# Patient Record
Sex: Female | Born: 1985 | Race: White | Hispanic: No | Marital: Married | Smoking: Never smoker
Health system: Southern US, Community
[De-identification: ages and names within clinical notes are randomized; demographics above are authoritative.]

## PROBLEM LIST (undated history)

## (undated) DIAGNOSIS — E049 Nontoxic goiter, unspecified: Secondary | ICD-10-CM

## (undated) DIAGNOSIS — F909 Attention-deficit hyperactivity disorder, unspecified type: Secondary | ICD-10-CM

## (undated) HISTORY — DX: Nontoxic goiter, unspecified: E04.9

## (undated) HISTORY — PX: TUBAL LIGATION: SHX77

---
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
## 2006-06-21 HISTORY — PX: BREAST BIOPSY: SHX20

## 2013-11-07 ENCOUNTER — Other Ambulatory Visit: Payer: Self-pay | Admitting: Physician Assistant

## 2013-11-07 ENCOUNTER — Encounter: Payer: Self-pay | Admitting: Physician Assistant

## 2013-11-07 ENCOUNTER — Ambulatory Visit (INDEPENDENT_AMBULATORY_CARE_PROVIDER_SITE_OTHER): Payer: PRIVATE HEALTH INSURANCE | Admitting: Physician Assistant

## 2013-11-07 VITALS — BP 130/79 | HR 84 | Ht 65.0 in | Wt 197.0 lb

## 2013-11-07 DIAGNOSIS — Z131 Encounter for screening for diabetes mellitus: Secondary | ICD-10-CM

## 2013-11-07 DIAGNOSIS — Z13220 Encounter for screening for lipoid disorders: Secondary | ICD-10-CM

## 2013-11-07 DIAGNOSIS — E042 Nontoxic multinodular goiter: Secondary | ICD-10-CM | POA: Insufficient documentation

## 2013-11-07 DIAGNOSIS — Z0000 Encounter for general adult medical examination without abnormal findings: Secondary | ICD-10-CM

## 2013-11-07 LAB — LIPID PANEL
Cholesterol: 207 mg/dL — ABNORMAL HIGH (ref 0–200)
HDL: 71 mg/dL (ref >39)
LDL CALC: 119 mg/dL — AB (ref 0–99)
Total CHOL/HDL Ratio: 2.9 Ratio
Triglycerides: 85 mg/dL (ref <150)
VLDL: 17 mg/dL (ref 0–40)

## 2013-11-07 LAB — COMPLETE METABOLIC PANEL WITH GFR
ALK PHOS: 119 U/L — AB (ref 39–117)
ALT: 25 U/L (ref 0–35)
AST: 22 U/L (ref 0–37)
Albumin: 4.5 g/dL (ref 3.5–5.2)
BILIRUBIN TOTAL: 0.6 mg/dL (ref 0.2–1.2)
BUN: 11 mg/dL (ref 6–23)
CO2: 23 meq/L (ref 19–32)
Calcium: 10 mg/dL (ref 8.4–10.5)
Chloride: 105 mEq/L (ref 96–112)
Creat: 0.74 mg/dL (ref 0.50–1.10)
GFR, Est Non African American: >89 mL/min
Glucose, Bld: 80 mg/dL (ref 70–99)
Potassium: 4.3 mEq/L (ref 3.5–5.3)
SODIUM: 139 meq/L (ref 135–145)
TOTAL PROTEIN: 7.6 g/dL (ref 6.0–8.3)

## 2013-11-07 NOTE — Patient Instructions (Signed)

## 2013-11-08 LAB — T4, FREE: FREE T4: 0.69 ng/dL — AB (ref 0.80–1.80)

## 2013-11-08 LAB — TSH: TSH: 12.067 u[IU]/mL — ABNORMAL HIGH (ref 0.350–4.500)

## 2013-11-11 ENCOUNTER — Other Ambulatory Visit: Payer: Self-pay | Admitting: Physician Assistant

## 2013-11-11 ENCOUNTER — Telehealth: Payer: Self-pay | Admitting: Physician Assistant

## 2013-11-11 ENCOUNTER — Encounter: Payer: Self-pay | Admitting: Physician Assistant

## 2013-11-11 DIAGNOSIS — E039 Hypothyroidism, unspecified: Secondary | ICD-10-CM

## 2013-11-11 DIAGNOSIS — E042 Nontoxic multinodular goiter: Secondary | ICD-10-CM

## 2013-11-11 MED ORDER — LEVOTHYROXINE SODIUM 75 MCG PO TABS: 75.0000 ug | ORAL_TABLET | Freq: Every day | ORAL | Status: DC

## 2013-11-11 NOTE — Progress Notes (Signed)
  Subjective:     Jocelyn Taylor is a 28 y.o. female and is here for a comprehensive physical exam. The patient reports no problems.  History   Social History  . Marital Status: Married    Spouse Name: N/A    Number of Children: N/A  . Years of Education: N/A   Occupational History  . Not on file.   Social History Main Topics  . Smoking status: Never Smoker   . Smokeless tobacco: Not on file  . Alcohol Use: Yes  . Drug Use: No  . Sexual Activity: Yes   Other Topics Concern  . Not on file   Social History Narrative  . No narrative on file   Health Maintenance  Topic Date Due  . Influenza Vaccine  09/21/2014  . Pap Smear  06/23/2016  . Tetanus/tdap  08/21/2022    The following portions of the patient's history were reviewed and updated as appropriate: allergies, current medications, past family history, past medical history, past social history, past surgical history and problem list.  Review of Systems A comprehensive review of systems was negative.   Objective:    BP 130/79  Pulse 84  Ht  (1.651 m)  Wt 197 lb (89.359 kg)  BMI 32.78 kg/m2 General appearance: alert, cooperative and appears stated age Head: Normocephalic, without obvious abnormality, atraumatic Eyes: conjunctivae/corneas clear. PERRL, EOM's intact. Fundi benign. Ears: normal TM's and external ear canals both ears Nose: Nares normal. Septum midline. Mucosa normal. No drainage or sinus tenderness. Throat: lips, mucosa, and tongue normal; teeth and gums normal Neck: no adenopathy, no carotid bruit, no JVD, supple, symmetrical, trachea midline and thyroid not enlarged, symmetric, no tenderness/mass/nodules Back: symmetric, no curvature. ROM normal. No CVA tenderness. Lungs: clear to auscultation bilaterally Heart: regular rate and rhythm, S1, S2 normal, no murmur, click, rub or gallop Abdomen: soft, non-tender; bowel sounds normal; no masses,  no organomegaly Extremities: extremities normal,  atraumatic, no cyanosis or edema Pulses: 2+ and symmetric Skin: Skin color, texture, turgor normal. No rashes or lesions Lymph nodes: Cervical, supraclavicular, and axillary nodes normal. Neurologic: Grossly normal    Assessment:    Healthy female exam.      Plan:    CPE- Tdap up to date. Flu shot up to date. Will get fasting labs. Discussed weight goals. Encouraged exercise at least 150 minutes weekly. Recommended calcium  and vitamin D 800 units.   Multiple thyroid nodules- will check TSH levels. Denies any new problems or concerns. No problems swallowing. Does not feel like increasing in size.     See After Visit Summary for Counseling Recommendations

## 2013-11-11 NOTE — Telephone Encounter (Signed)
Jocelyn Taylor, since TSH was elevated. I would like to add anti TPO as well as get follow up ultrasound or thyroid just to make sure nothing appears to be changing with nodules. Is pt ok with u/s? Does she want to go downstairs or where she has gone in past?

## 2013-11-12 ENCOUNTER — Encounter: Payer: Self-pay | Admitting: Physician Assistant

## 2013-11-12 DIAGNOSIS — E063 Autoimmune thyroiditis: Secondary | ICD-10-CM | POA: Insufficient documentation

## 2013-11-12 LAB — THYROID PEROXIDASE ANTIBODY

## 2013-11-13 NOTE — Telephone Encounter (Signed)
Pt wants to go to [facility].  Order faxed and placed in your basket to sign.

## 2013-11-21 ENCOUNTER — Ambulatory Visit (INDEPENDENT_AMBULATORY_CARE_PROVIDER_SITE_OTHER): Payer: PRIVATE HEALTH INSURANCE | Admitting: Physician Assistant

## 2013-11-21 ENCOUNTER — Encounter: Payer: Self-pay | Admitting: Physician Assistant

## 2013-11-21 VITALS — BP 138/91 | HR 91 | Ht 65.0 in | Wt 199.0 lb

## 2013-11-21 DIAGNOSIS — R41840 Attention and concentration deficit: Secondary | ICD-10-CM | POA: Insufficient documentation

## 2013-11-21 DIAGNOSIS — F419 Anxiety disorder, unspecified: Secondary | ICD-10-CM

## 2013-11-21 MED ORDER — BUPROPION HCL ER (XL) 150 MG PO TB24: 150.0000 mg | ORAL_TABLET | ORAL | Status: DC

## 2013-11-21 NOTE — Progress Notes (Signed)
   Subjective:    Patient ID: Jocelyn Taylor, female    DOB: 1986/02/09, 28 y.o.   MRN: [id number]  HPI Pt presents to the clinic to discuss focus issues. Every since she can remember she has had a hard time focuses but always made it by. In college she really noticed it was harder for her than all the rest of the kids but working hard. She never wanted to start taking medication due to stigma and the fact that her mother is on a lot of medication for mental diseases. She is now overwhelmed. She finds it hard to prioritize. She is constantly starting different projects but having a hard time comtenplating. It has started to affect work. She has not finished task due to forgetting. She is not getting very anxious    Review of Systems  All other systems reviewed and are negative.      Objective:   Physical Exam  Constitutional: She is oriented to person, place, and time. She appears well-developed and well-nourished.  HENT:  Head: Normocephalic and atraumatic.  Cardiovascular: Normal rate, regular rhythm and normal heart sounds.   Pulmonary/Chest: Effort normal and breath sounds normal.  Neurological: She is alert and oriented to person, place, and time.  Skin: Skin is dry.  Psychiatric: She has a normal mood and affect. Her behavior is normal.          Assessment & Plan:  Inattention/anxiety- certainly sounds like she has both focus issues ans anxiety. Would like her formally evaluated. Discussed stimulants and anxiety medications. Started wellbutrin 150mg  XL daily. May take some time to see any results. ADD/ADHD questionares were both positive with moderate focus issues. Follow up after formal testing or 4-6 weeks.   Pt lactating. wellbutrin has minimal milk transmission to the baby but does have some. Monitor baby for any mood changes once starting medication.

## 2013-11-21 NOTE — Patient Instructions (Signed)
Will get ADHD testing.

## 2013-12-12 ENCOUNTER — Ambulatory Visit: Payer: PRIVATE HEALTH INSURANCE | Admitting: Physician Assistant

## 2014-01-02 ENCOUNTER — Encounter: Payer: Self-pay | Admitting: Physician Assistant

## 2014-01-02 ENCOUNTER — Ambulatory Visit (INDEPENDENT_AMBULATORY_CARE_PROVIDER_SITE_OTHER): Payer: PRIVATE HEALTH INSURANCE | Admitting: Physician Assistant

## 2014-01-02 VITALS — BP 122/72 | HR 80 | Ht 65.0 in | Wt 196.0 lb

## 2014-01-02 DIAGNOSIS — E063 Autoimmune thyroiditis: Secondary | ICD-10-CM

## 2014-01-02 DIAGNOSIS — R41840 Attention and concentration deficit: Secondary | ICD-10-CM

## 2014-01-02 DIAGNOSIS — E042 Nontoxic multinodular goiter: Secondary | ICD-10-CM

## 2014-01-02 LAB — T4, FREE: FREE T4: 1.19 ng/dL (ref 0.80–1.80)

## 2014-01-02 LAB — TSH: TSH: 4.136 u[IU]/mL (ref 0.350–4.500)

## 2014-01-02 MED ORDER — METHYLPHENIDATE HCL ER 18 MG PO TB24: 18.0000 mg | ORAL_TABLET | Freq: Every day | ORAL | Status: DC

## 2014-01-02 NOTE — Progress Notes (Signed)
   Subjective:    Patient ID: Jocelyn Taylor, female    DOB: 05/03/1985, 28 y.o.   MRN: [id number]  HPI Patient is a 28 year old female who presents to the clinic to follow-up on new diagnosis of Hashimoto's thyroiditis. She recently started Synthroid 75 MCG and doing well. She has not noticed any overt symptomatic changes. She does deny any side effects. She is 5 months postpartum.   Review of Systems  All other systems reviewed and are negative.      Objective:   Physical Exam  Constitutional: She is oriented to person, place, and time. She appears well-developed and well-nourished.  HENT:  Head: Normocephalic and atraumatic.  Neck: Normal range of motion. Neck supple. No thyromegaly present.  Cardiovascular: Normal rate, regular rhythm and normal heart sounds.   Pulmonary/Chest: Effort normal and breath sounds normal.  Neurological: She is alert and oriented to person, place, and time.  Skin: Skin is dry.  Psychiatric: She has a normal mood and affect. Her behavior is normal.          Assessment & Plan:  Hashimoto's thyroiditis/multiple thyroid nodules-patient has been on Synthroid for 6 weeks. We'll recheck TSH and free T4. Discussed with patient that we'll continue to monitor her levels because her hormone could start to even out and she may come out of this thyroiditis date after 4 months of being postpartum. She does have a history of thyroid nodules. I would like for patient to get repeat ultrasound of the thyroid to make sure there is no growth or changes. Patient requests to make this appointment on her own since she works for the [facility] [facility].  Inattention- patient was never called with hormonal evaluation for ADHD. Will start trial of Concerta 18 MCG follow-up in one month. Discussed side effects of stimulant. Patient aware.

## 2014-01-07 ENCOUNTER — Other Ambulatory Visit: Payer: Self-pay | Admitting: *Deleted

## 2014-01-07 MED ORDER — LEVOTHYROXINE SODIUM 75 MCG PO TABS: 75.0000 ug | ORAL_TABLET | Freq: Every day | ORAL | Status: DC

## 2014-01-29 ENCOUNTER — Telehealth: Payer: Self-pay | Admitting: *Deleted

## 2014-01-29 DIAGNOSIS — E042 Nontoxic multinodular goiter: Secondary | ICD-10-CM

## 2014-01-29 NOTE — Telephone Encounter (Signed)
Reordered thyroid ultrasound because pt wants to go to [facility] in [place] instead of downstairs.

## 2014-01-30 ENCOUNTER — Encounter: Payer: Self-pay | Admitting: Physician Assistant

## 2014-01-30 ENCOUNTER — Ambulatory Visit (INDEPENDENT_AMBULATORY_CARE_PROVIDER_SITE_OTHER): Payer: PRIVATE HEALTH INSURANCE | Admitting: Physician Assistant

## 2014-01-30 VITALS — BP 119/70 | HR 92 | Ht 65.0 in | Wt 194.0 lb

## 2014-01-30 DIAGNOSIS — R41840 Attention and concentration deficit: Secondary | ICD-10-CM

## 2014-01-30 DIAGNOSIS — F419 Anxiety disorder, unspecified: Secondary | ICD-10-CM

## 2014-01-30 MED ORDER — METHYLPHENIDATE HCL ER 36 MG PO TB24: 36.0000 mg | ORAL_TABLET | Freq: Every day | ORAL | Status: DC

## 2014-01-30 MED ORDER — LEVOTHYROXINE SODIUM 75 MCG PO TABS: 75.0000 ug | ORAL_TABLET | Freq: Every day | ORAL | Status: DC

## 2014-01-30 NOTE — Progress Notes (Signed)
   Subjective:    Patient ID: Jocelyn Taylor, female    DOB: [birth year], 28 y.o.   MRN: [id number]  HPI Presented to the clinic to follow up on one month of concerta. Denies any side effects. Sleeping good and no palpitations. She feels some improvement in her focus but still feels like it takes a while for it to kick in. She has noticed not quite as "mind racing".    Review of Systems  All other systems reviewed and are negative.      Objective:   Physical Exam  Constitutional: She is oriented to person, place, and time. She appears well-developed and well-nourished.  HENT:  Head: Normocephalic and atraumatic.  Cardiovascular: Normal rate, regular rhythm and normal heart sounds.   Pulmonary/Chest: Effort normal and breath sounds normal.  Neurological: She is alert and oriented to person, place, and time.  Skin: Skin is dry.  Psychiatric: She has a normal mood and affect. Her behavior is normal.          Assessment & Plan:  Inattention/ADD- increased concerta. rediscussed side effects. Follow up in 1 month.

## 2014-03-08 ENCOUNTER — Encounter: Payer: Self-pay | Admitting: Physician Assistant

## 2014-03-10 ENCOUNTER — Other Ambulatory Visit: Payer: Self-pay | Admitting: Physician Assistant

## 2014-03-10 MED ORDER — METHYLPHENIDATE HCL ER (OSM) 27 MG PO TBCR: 27.0000 mg | EXTENDED_RELEASE_TABLET | ORAL | Status: DC

## 2014-03-10 NOTE — Progress Notes (Signed)
 36mg  making her feel a little jittery. Will try 27mg .

## 2014-05-04 ENCOUNTER — Other Ambulatory Visit: Payer: Self-pay | Admitting: *Deleted

## 2014-05-04 ENCOUNTER — Encounter: Payer: Self-pay | Admitting: Physician Assistant

## 2014-05-04 ENCOUNTER — Telehealth: Payer: Self-pay | Admitting: *Deleted

## 2014-05-04 MED ORDER — METHYLPHENIDATE HCL ER (OSM) 27 MG PO TBCR: 27.0000 mg | EXTENDED_RELEASE_TABLET | ORAL | Status: DC

## 2014-05-04 NOTE — Telephone Encounter (Signed)
Called and left a voicemail with patient to see if she has a direct phone number to Dr. Asher MuirHorns office. I need to contact them to request notes for her recent visit regarding ADHD.  Minna AntisEbony Brigham, [place]

## 2014-05-05 ENCOUNTER — Telehealth: Payer: Self-pay | Admitting: *Deleted

## 2014-05-05 DIAGNOSIS — E039 Hypothyroidism, unspecified: Secondary | ICD-10-CM

## 2014-05-05 NOTE — Telephone Encounter (Signed)
TSH ordered & faxed to lab.

## 2014-05-09 LAB — TSH: TSH: 1.143 u[IU]/mL (ref 0.350–4.500)

## 2014-05-11 ENCOUNTER — Other Ambulatory Visit: Payer: Self-pay | Admitting: *Deleted

## 2014-05-11 MED ORDER — LEVOTHYROXINE SODIUM 75 MCG PO TABS: 75.0000 ug | ORAL_TABLET | Freq: Every day | ORAL | Status: DC

## 2014-06-08 ENCOUNTER — Encounter: Payer: Self-pay | Admitting: Physician Assistant

## 2014-06-09 ENCOUNTER — Other Ambulatory Visit: Payer: Self-pay | Admitting: Physician Assistant

## 2014-06-09 MED ORDER — METHYLPHENIDATE HCL ER (OSM) 27 MG PO TBCR: 27.0000 mg | EXTENDED_RELEASE_TABLET | ORAL | Status: DC

## 2014-06-15 ENCOUNTER — Ambulatory Visit: Payer: PRIVATE HEALTH INSURANCE | Admitting: Physician Assistant

## 2014-06-23 ENCOUNTER — Ambulatory Visit (INDEPENDENT_AMBULATORY_CARE_PROVIDER_SITE_OTHER): Payer: 59 | Admitting: Physician Assistant

## 2014-06-23 ENCOUNTER — Encounter: Payer: Self-pay | Admitting: Physician Assistant

## 2014-06-23 VITALS — BP 128/86 | HR 84 | Wt 181.0 lb

## 2014-06-23 DIAGNOSIS — F900 Attention-deficit hyperactivity disorder, predominantly inattentive type: Secondary | ICD-10-CM | POA: Diagnosis not present

## 2014-06-23 DIAGNOSIS — F909 Attention-deficit hyperactivity disorder, unspecified type: Secondary | ICD-10-CM | POA: Insufficient documentation

## 2014-06-23 MED ORDER — METHYLPHENIDATE HCL ER (OSM) 27 MG PO TBCR: 27.0000 mg | EXTENDED_RELEASE_TABLET | ORAL | Status: DC

## 2014-06-23 NOTE — Progress Notes (Signed)
   Subjective:    Patient ID: Jocelyn Taylor, female    DOB: 17-Jun-1985, 29 y.o.   MRN: [id number]  HPI Pt is a 29 yo female who presents to the clinic for medication refills on ADHD medication. She is doing great. No side effects or concerns. No anorexia, palptiations, CP. She is much more productive at work.    Review of Systems  All other systems reviewed and are negative.      Objective:   Physical Exam  Constitutional: She is oriented to person, place, and time. She appears well-developed and well-nourished.  HENT:  Head: Normocephalic and atraumatic.  Cardiovascular: Normal rate, regular rhythm and normal heart sounds.   Pulmonary/Chest: Effort normal and breath sounds normal.  Neurological: She is alert and oriented to person, place, and time.  Psychiatric: She has a normal mood and affect. Her behavior is normal.          Assessment & Plan:  Adult ADHD- refilled for 4 months. Vitals look good.

## 2014-06-24 ENCOUNTER — Telehealth: Payer: Self-pay | Admitting: Physician Assistant

## 2014-06-24 NOTE — Telephone Encounter (Signed)
Received fax for PA on Methylphenidate ER 27mg  sent through cover my meds waiting on auth. - CF

## 2014-06-26 ENCOUNTER — Telehealth: Payer: Self-pay | Admitting: *Deleted

## 2014-06-26 ENCOUNTER — Telehealth: Payer: Self-pay | Admitting: Physician Assistant

## 2014-06-26 ENCOUNTER — Other Ambulatory Visit: Payer: Self-pay | Admitting: Physician Assistant

## 2014-06-26 MED ORDER — METHYLPHENIDATE HCL ER (OSM) 27 MG PO TBCR: 27.0000 mg | EXTENDED_RELEASE_TABLET | ORAL | Status: DC

## 2014-06-26 NOTE — Telephone Encounter (Signed)
Changed directions to include "Brand only. Dispense as written."

## 2014-06-26 NOTE — Telephone Encounter (Signed)
Received information from Pharmacy, wants name brand medication only. New Rx has been printed.

## 2014-06-26 NOTE — Telephone Encounter (Signed)
Pt called and stated that she had spoken with Amber yesterday about a medication issue. She said that her insurance won't pay for concerta unless she gets a PA. [facility].Jocelyn PacasBarkley, [facility] [place] [place]

## 2014-06-26 NOTE — Telephone Encounter (Signed)
Per insurance, brand name is approved but will not authorize generic.

## 2014-06-29 ENCOUNTER — Telehealth: Payer: Self-pay | Admitting: Physician Assistant

## 2014-06-29 NOTE — Telephone Encounter (Signed)
[facility] Concerta from 06/29/2014 to 06/29/2015. PA [id number]. - CF

## 2014-06-29 NOTE — Telephone Encounter (Signed)
Received fax for pa on Concerta sent through cover my meds waiting on auth. - CF

## 2014-08-02 ENCOUNTER — Encounter: Payer: Self-pay | Admitting: Physician Assistant

## 2014-08-11 ENCOUNTER — Telehealth: Payer: Self-pay | Admitting: Physician Assistant

## 2014-08-11 NOTE — Telephone Encounter (Signed)
Received fax from [facility] care and medication is approved from 08/11/2014 - 08/11/2015. File ID [id number] - CF

## 2014-08-11 NOTE — Telephone Encounter (Signed)
Received fax for prior authorization on Methylphenidate HCL ER sent through cover my meds waiting on authorization. - CF

## 2014-09-29 ENCOUNTER — Ambulatory Visit (INDEPENDENT_AMBULATORY_CARE_PROVIDER_SITE_OTHER): Payer: 59 | Admitting: Family Medicine

## 2014-09-29 ENCOUNTER — Encounter: Payer: Self-pay | Admitting: Family Medicine

## 2014-09-29 VITALS — BP 125/71 | HR 82 | Temp 98.1°F | Ht 65.0 in | Wt 181.0 lb

## 2014-09-29 DIAGNOSIS — H9202 Otalgia, left ear: Secondary | ICD-10-CM | POA: Diagnosis not present

## 2014-09-29 MED ORDER — PREDNISONE 20 MG PO TABS: 40.0000 mg | ORAL_TABLET | Freq: Every day | ORAL | Status: DC

## 2014-09-29 NOTE — Progress Notes (Signed)
   Subjective:    Patient ID: Jocelyn Taylor, female    DOB: February 11, 1986, 29 y.o.   MRN: [id number]  HPI           ear pressure x2wks. she describes it as a persistant dull ache that starts in her L ear and goes down into her neck. it feels tight. denies any trouble swallowing. it hurts deep inside of her ear did feel as if she had some post nasal drip this AM. she took IBU yesterday. she does have a childhood hx of ear infections. no fevers/chills. she does feel like the inside of her ears a little bit itchy. No sneezing, or watery eyes.    Hx of thyroid issues. No recent URIs.    Review of Systems     Objective:   Physical Exam  Constitutional: She is oriented to person, place, and time. She appears well-developed and well-nourished.  HENT:  Head: Normocephalic and atraumatic.  Right Ear: External ear normal.  Left Ear: External ear normal.  Nose: Nose normal.  Mouth/Throat: Oropharynx is clear and moist.  TMs and canals are clear.   Eyes: Conjunctivae and EOM are normal. Pupils are equal, round, and reactive to light.  Neck: Neck supple. No thyromegaly present.  Cardiovascular: Normal rate, regular rhythm and normal heart sounds.   Pulmonary/Chest: Effort normal and breath sounds normal. She has no wheezes.  Lymphadenopathy:    She has no cervical adenopathy.  Neurological: She is alert and oriented to person, place, and time.  Skin: Skin is warm and dry.  Psychiatric: She has a normal mood and affect.          Assessment & Plan:  Left otalgia-exam was normal but tympanometry did show a widened tympanogram consistent with oncoming effusion. We'll treat with 5 days of prednisone and have her start an over-the-counter oral and histamine such as Claritin. If symptoms not significantly improved over the next 2 weeks and please give [place] a call back.

## 2014-10-07 ENCOUNTER — Encounter: Payer: Self-pay | Admitting: Physician Assistant

## 2014-10-16 IMAGING — US US SOFT TISSUE HEAD/NECK
1 series · 14 of 25 positions shown · non-contrast
Comparison: [DATE]

CLINICAL DATA: Hypothyroidism.  Hashimoto's thyroiditis.

EXAM:
THYROID ULTRASOUND
TECHNIQUE: Ultrasound examination of the thyroid gland and adjacent soft
tissues was performed.

[Series 1: us soft tissue head/neck · 0.05mm/px · 14 of 42 slices shown]
[im 1/42]
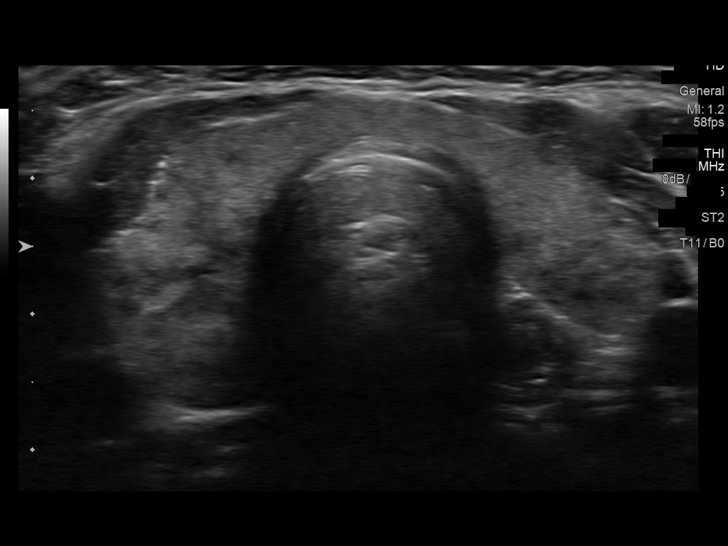
[im 4/42]
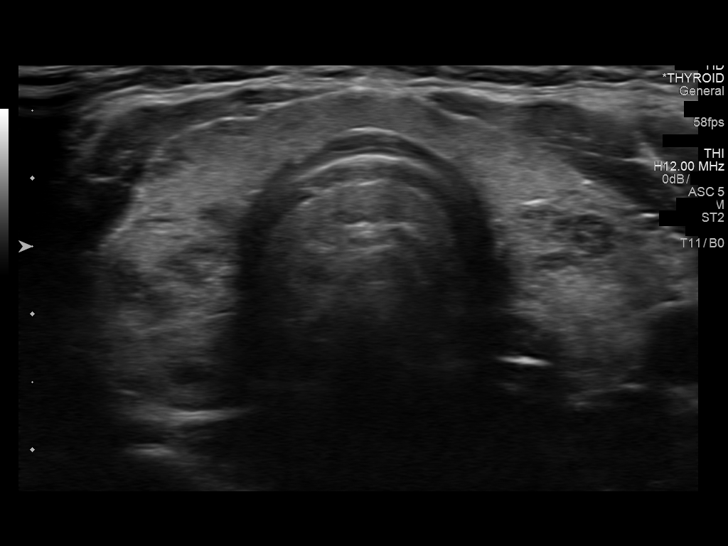
[im 7/42]
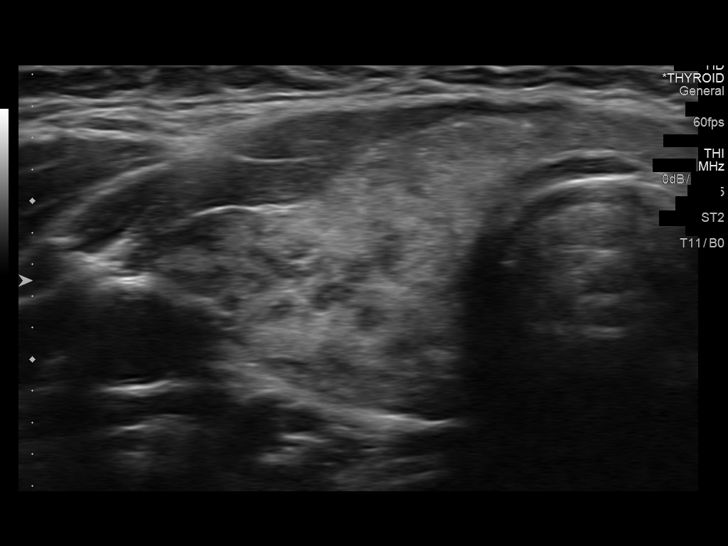
[im 11/42]
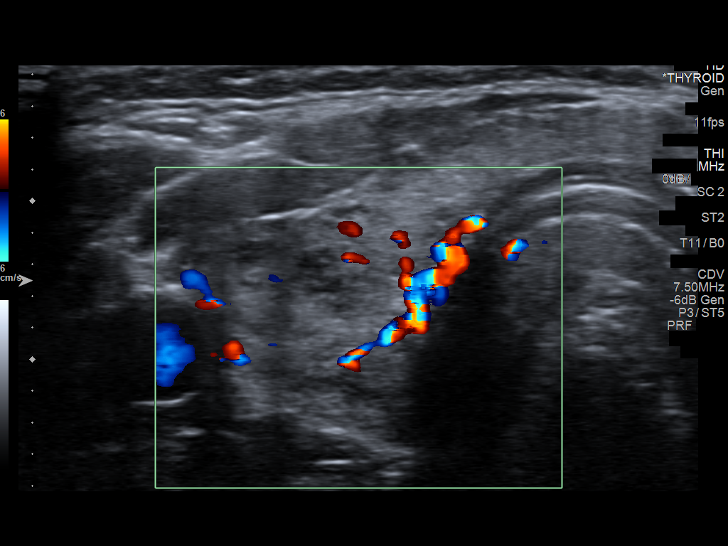
[im 14/42]
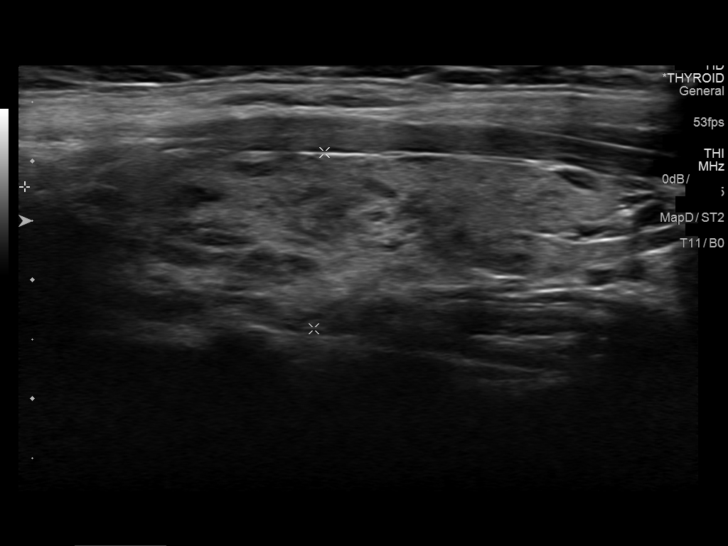
[im 16/42]
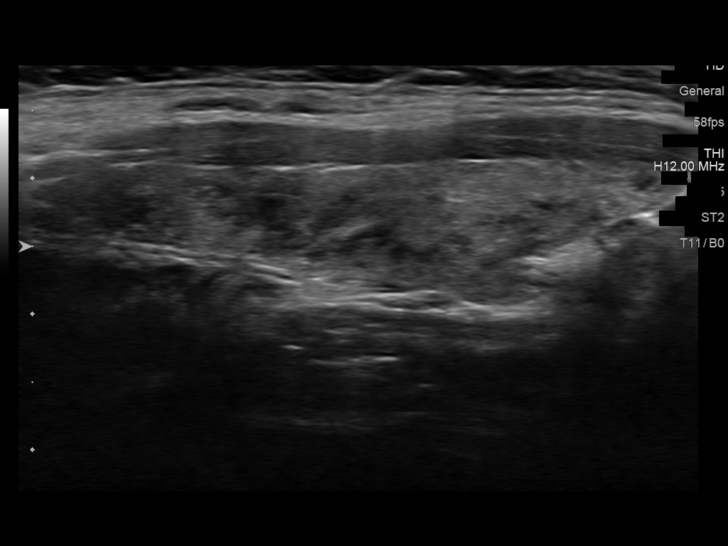
[im 19/42]
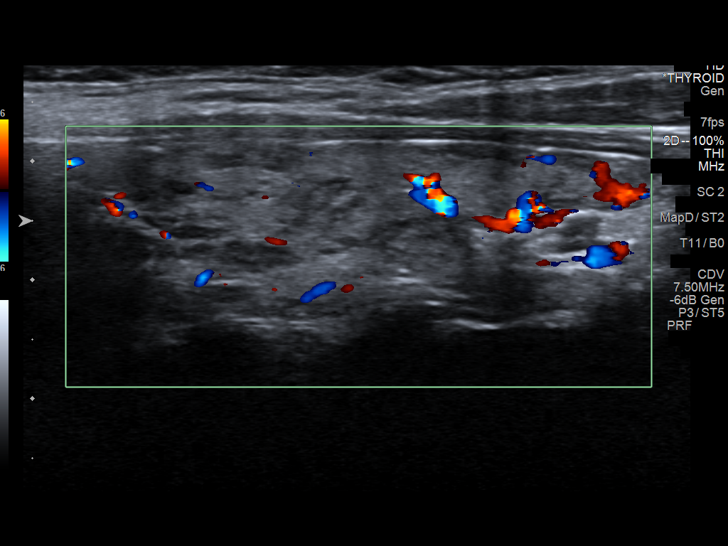
[im 23/42]
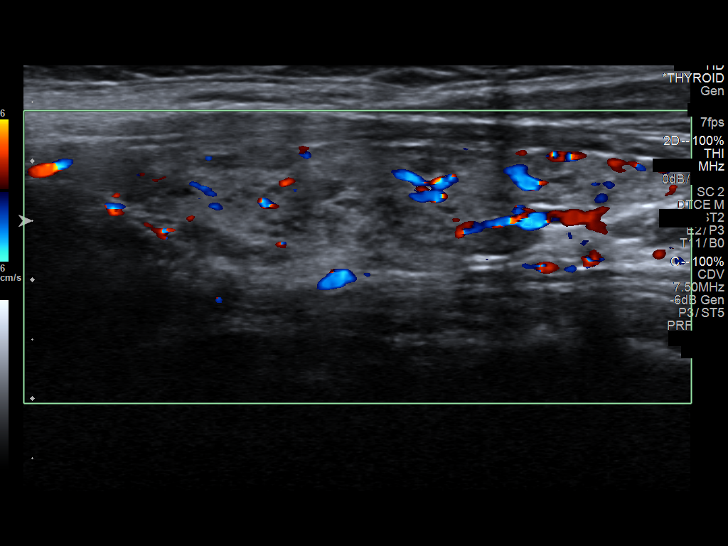
[im 26/42]
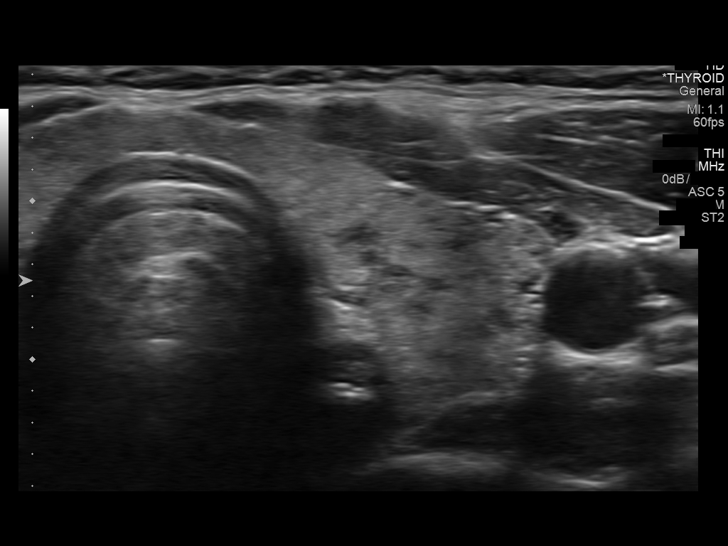
[im 28/42]
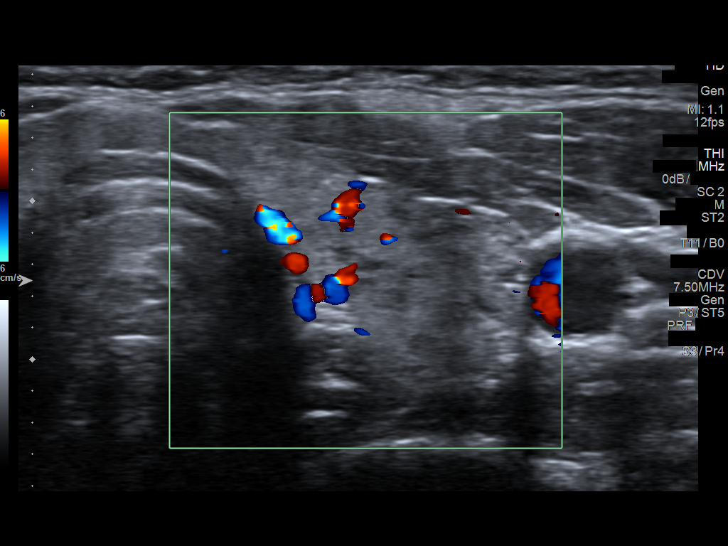
[im 31/42]
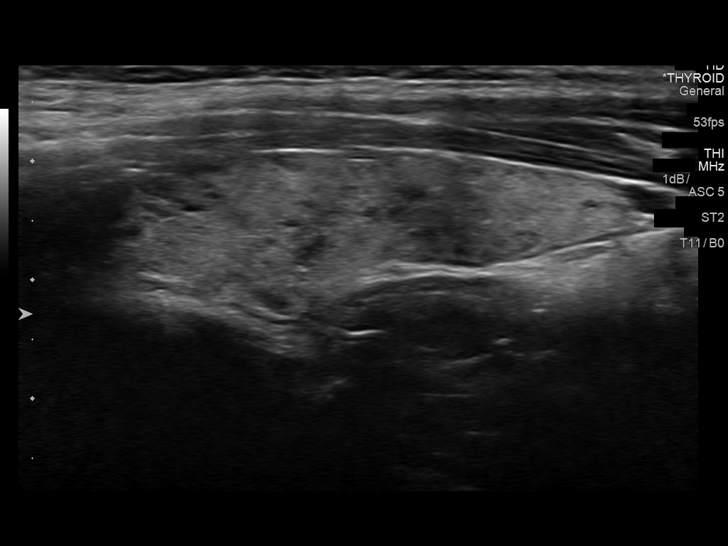
[im 35/42]
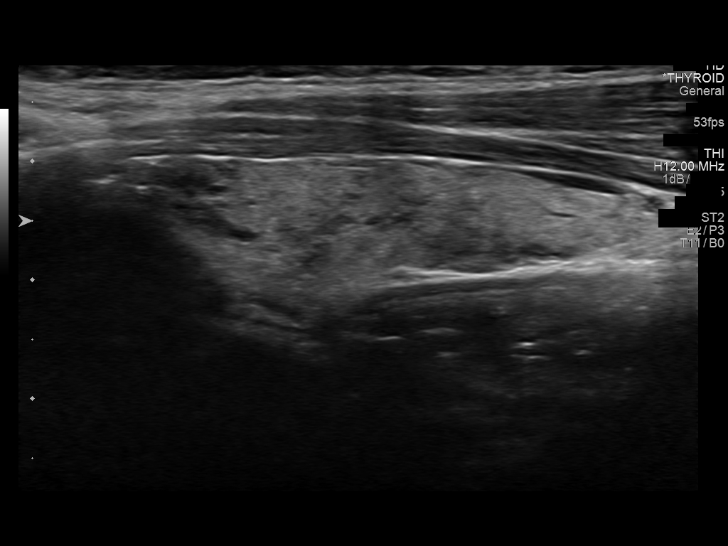
[im 38/42]
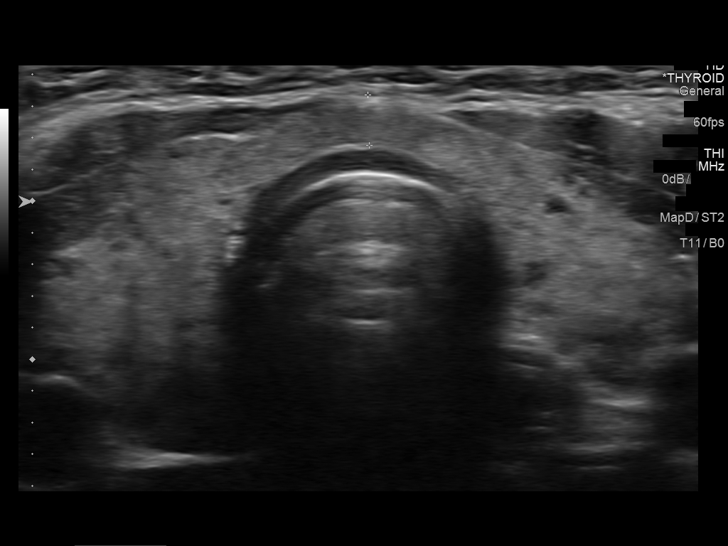
[im 42/42]
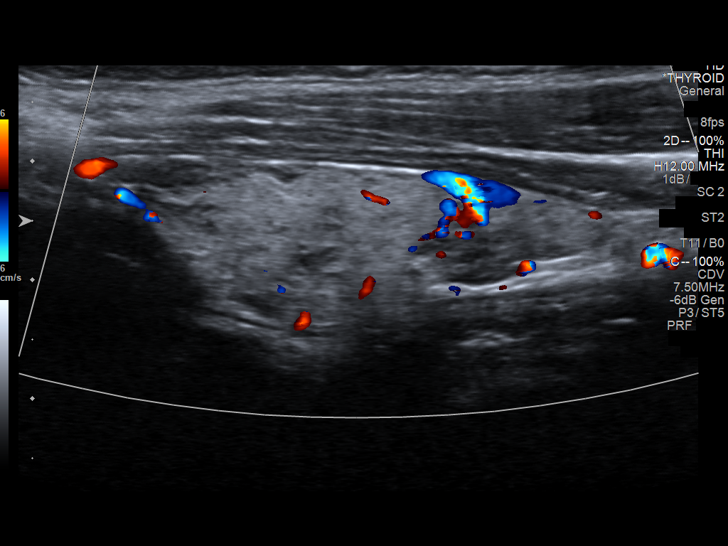

[14 of 25 positions shown; findings below may reference images not displayed]

FINDINGS: Right thyroid lobe

Measurements: 5.7 x 1.5 x 2.1 cm. Heterogeneous tissue without
nodule.

Left thyroid lobe

Measurements: 5.5 x 1.5 x 1.6 cm. Heterogeneous tissue without
nodule.

Isthmus

Thickness: 3 mm.  No nodules visualized.

Lymphadenopathy

None visualized.
IMPRESSION: Heterogeneous mildly enlarged gland without focal nodule.

## 2014-10-20 ENCOUNTER — Encounter: Payer: Self-pay | Admitting: Family Medicine

## 2014-10-20 ENCOUNTER — Ambulatory Visit (INDEPENDENT_AMBULATORY_CARE_PROVIDER_SITE_OTHER): Payer: 59 | Admitting: Family Medicine

## 2014-10-20 VITALS — BP 130/81 | HR 90 | Temp 97.9°F | Ht 65.0 in | Wt 180.0 lb

## 2014-10-20 DIAGNOSIS — J019 Acute sinusitis, unspecified: Secondary | ICD-10-CM

## 2014-10-20 NOTE — Patient Instructions (Signed)

## 2014-10-20 NOTE — Progress Notes (Signed)
   Subjective:    Patient ID: Jocelyn Taylor, female    DOB: September 07, 1985, 29 y.o.   MRN: [id number]  HPI Started 4 days with very ST and mild congtestion. Woke up feeling worse today with colored mucous. Her LNs in her neck are tender.  No fever, chills, or sweats.  Mild bilateral ear pressure. No nausea.  Had diarrhea x 2 days.    Review of Systems     Objective:   Physical Exam  Constitutional: She is oriented to person, place, and time. She appears well-developed and well-nourished.  HENT:  Head: Normocephalic and atraumatic.  Right Ear: External ear normal.  Left Ear: External ear normal.  Nose: Nose normal.  Mouth/Throat: Oropharynx is clear and moist.  TMs and canals are clear.   Eyes: Conjunctivae and EOM are normal. Pupils are equal, round, and reactive to light.  Neck: Neck supple. No thyromegaly present.  Cardiovascular: Normal rate, regular rhythm and normal heart sounds.   Pulmonary/Chest: Effort normal and breath sounds normal. She has no wheezes.  Lymphadenopathy:    She has no cervical adenopathy.  Neurological: She is alert and oriented to person, place, and time.  Skin: Skin is warm and dry.  Psychiatric: She has a normal mood and affect.          Assessment & Plan:  Sinusitis, acute -  Likely viral. If still symptomatic by the weekend can consider treatment with an antibiotic. We discussed that typically need to be sick for at least 7-10 days before we treat. If she feels like she's getting worse she can call back sooner or she feels like she's not getting a little bit better before the weekend and give [place] a call back. She actually will be seeing her PCP on Friday , we discussed symptomatic care.

## 2014-10-23 ENCOUNTER — Ambulatory Visit: Payer: 59 | Admitting: Physician Assistant

## 2014-10-23 ENCOUNTER — Encounter: Payer: Self-pay | Admitting: Physician Assistant

## 2014-10-23 ENCOUNTER — Ambulatory Visit (INDEPENDENT_AMBULATORY_CARE_PROVIDER_SITE_OTHER): Payer: 59 | Admitting: Physician Assistant

## 2014-10-23 VITALS — BP 135/88 | HR 96 | Wt 178.0 lb

## 2014-10-23 DIAGNOSIS — M533 Sacrococcygeal disorders, not elsewhere classified: Secondary | ICD-10-CM

## 2014-10-23 DIAGNOSIS — F900 Attention-deficit hyperactivity disorder, predominantly inattentive type: Secondary | ICD-10-CM

## 2014-10-23 DIAGNOSIS — F909 Attention-deficit hyperactivity disorder, unspecified type: Secondary | ICD-10-CM

## 2014-10-23 MED ORDER — METHYLPHENIDATE HCL ER (OSM) 27 MG PO TBCR: 27.0000 mg | EXTENDED_RELEASE_TABLET | ORAL | Status: DC

## 2014-10-23 NOTE — Progress Notes (Signed)
   Subjective:    Patient ID: Jocelyn Taylor, female    DOB: 05-Sep-1985, 29 y.o.   MRN: [id number]  HPI  Pt presents to the clinic for ADHD medication refill. Controlled with concerta . No concerns or complaints. No side effects of anorexia, insomnia, mood changes.   Pt does mention ongoing very low back pain for years. Has been going to choirpracter which has helped but she is concerned something else could be going on. Pain worse with bending and twisting. Some days worse than others. No new trauma. No radiation of pain into legs. No saddle anesthesia. Not taking any medications.     Review of Systems  All other systems reviewed and are negative.      Objective:   Physical Exam  Constitutional: She is oriented to person, place, and time. She appears well-developed and well-nourished.  HENT:  Head: Normocephalic and atraumatic.  Cardiovascular: Normal rate, regular rhythm and normal heart sounds.   Pulmonary/Chest: Effort normal and breath sounds normal.  Musculoskeletal:  Pain with palpation over bilateral SI joint.  No pain over lumbar spine to palpation.  Negative straight leg test.  Patellar reflexes 2+, symmetric.  Normal ROM at waist.   Neurological: She is alert and oriented to person, place, and time.  Psychiatric: She has a normal mood and affect. Her behavior is normal.          Assessment & Plan:  ADHD- refilled for 3 months at same dose. Follow up in 3 months. BP rechecked and normal at 2nd recheck.   SI joint dysfunction bilateral- discussed xrays. Pt declined today. Give exercises. Samples of pennsaid were given. Warm compresses and warm baths at night to relax. As needed oral NSAIDs. If no improvement needs imaging and possible PT.

## 2014-10-26 DIAGNOSIS — M533 Sacrococcygeal disorders, not elsewhere classified: Secondary | ICD-10-CM | POA: Insufficient documentation

## 2014-10-26 MED ORDER — DICLOFENAC SODIUM 2 % TD SOLN: TRANSDERMAL | Status: DC

## 2014-12-06 ENCOUNTER — Other Ambulatory Visit: Payer: Self-pay | Admitting: Physician Assistant

## 2015-01-22 ENCOUNTER — Encounter: Payer: Self-pay | Admitting: Physician Assistant

## 2015-01-22 ENCOUNTER — Ambulatory Visit (INDEPENDENT_AMBULATORY_CARE_PROVIDER_SITE_OTHER): Payer: 59 | Admitting: Physician Assistant

## 2015-01-22 ENCOUNTER — Ambulatory Visit (INDEPENDENT_AMBULATORY_CARE_PROVIDER_SITE_OTHER): Payer: 59

## 2015-01-22 VITALS — BP 127/55 | HR 82 | Ht 65.0 in | Wt 181.0 lb

## 2015-01-22 DIAGNOSIS — M4186 Other forms of scoliosis, lumbar region: Secondary | ICD-10-CM

## 2015-01-22 DIAGNOSIS — M4120 Other idiopathic scoliosis, site unspecified: Secondary | ICD-10-CM

## 2015-01-22 DIAGNOSIS — E039 Hypothyroidism, unspecified: Secondary | ICD-10-CM | POA: Diagnosis not present

## 2015-01-22 DIAGNOSIS — E042 Nontoxic multinodular goiter: Secondary | ICD-10-CM

## 2015-01-22 DIAGNOSIS — F909 Attention-deficit hyperactivity disorder, unspecified type: Secondary | ICD-10-CM

## 2015-01-22 DIAGNOSIS — E063 Autoimmune thyroiditis: Secondary | ICD-10-CM | POA: Diagnosis not present

## 2015-01-22 DIAGNOSIS — M533 Sacrococcygeal disorders, not elsewhere classified: Secondary | ICD-10-CM

## 2015-01-22 DIAGNOSIS — Z23 Encounter for immunization: Secondary | ICD-10-CM

## 2015-01-22 DIAGNOSIS — F900 Attention-deficit hyperactivity disorder, predominantly inattentive type: Secondary | ICD-10-CM

## 2015-01-22 DIAGNOSIS — M7630 Iliotibial band syndrome, unspecified leg: Secondary | ICD-10-CM

## 2015-01-22 IMAGING — CR DG LUMBAR SPINE COMPLETE 4+V
5 series · 5 of 5 positions shown · non-contrast
Comparison: None.

CLINICAL DATA: Low back pain for the past week.  No known injury.

EXAM:
LUMBAR SPINE - COMPLETE 4+ VIEW

[l-spine ap]
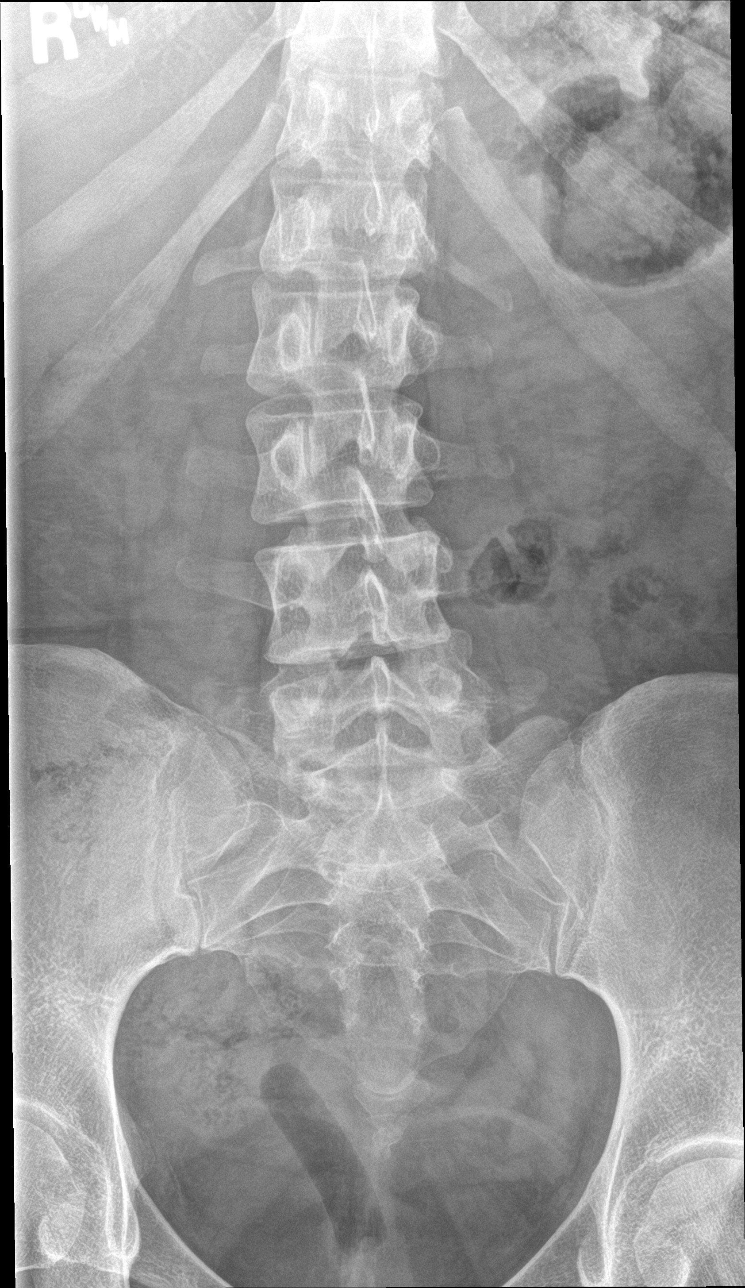

[l-spine obl (1 of 2)]
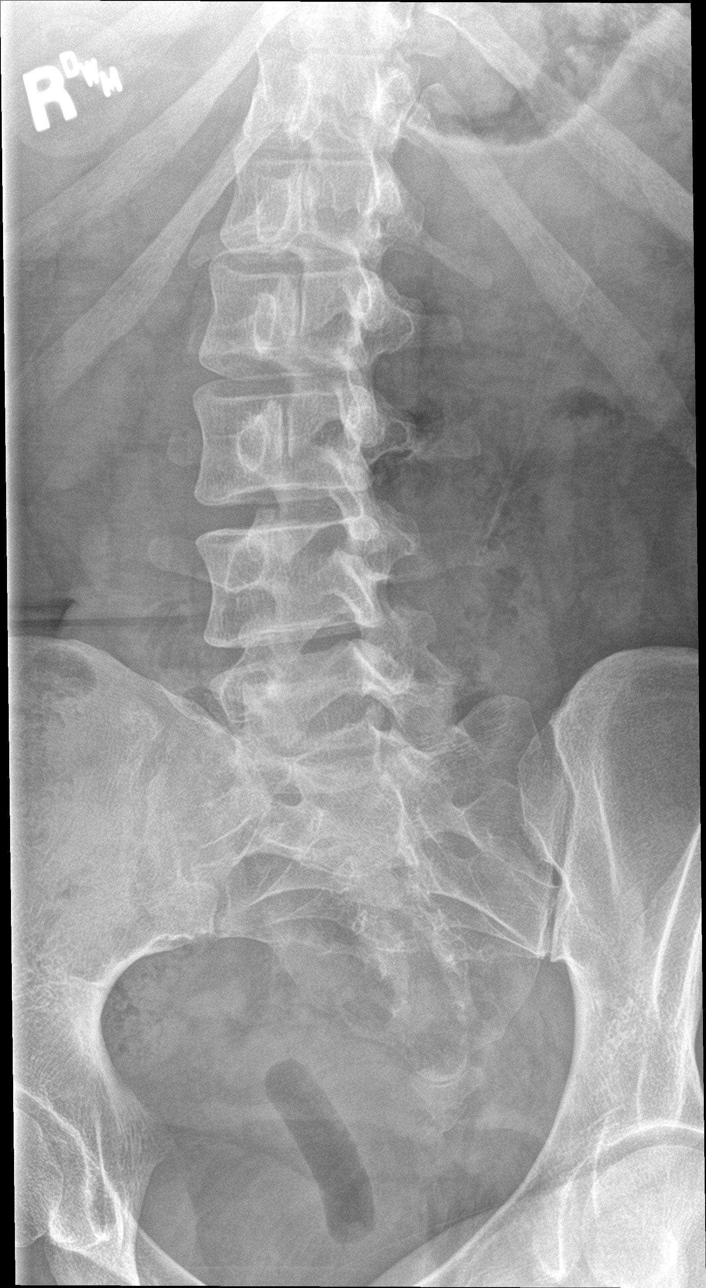

[l-spine obl (2 of 2)]
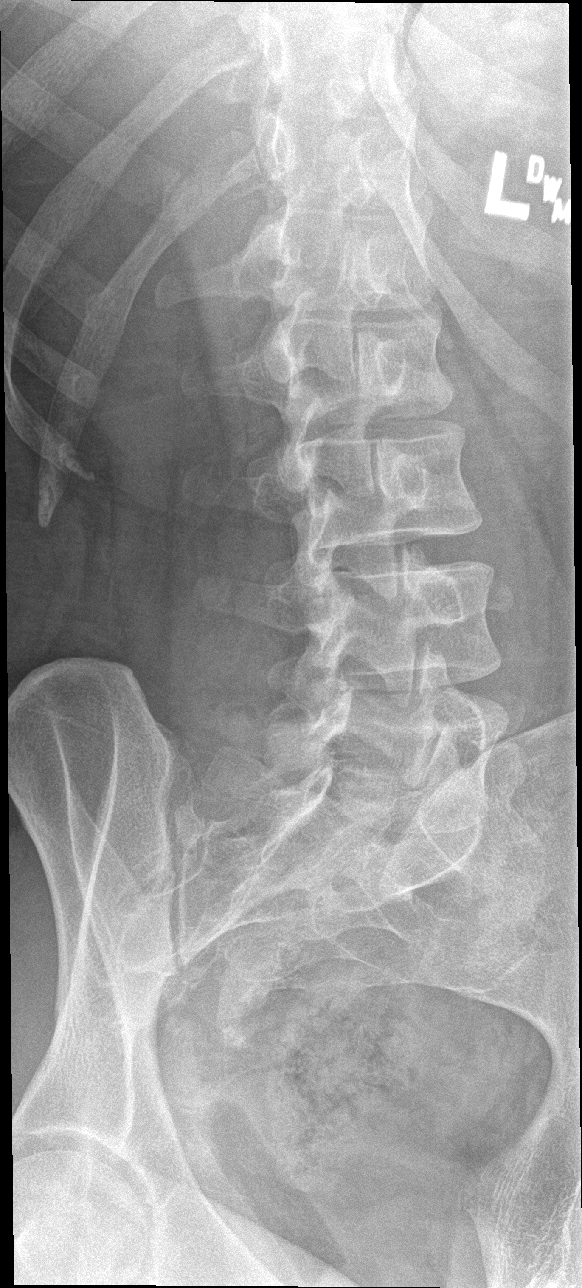

[l-spine lat]
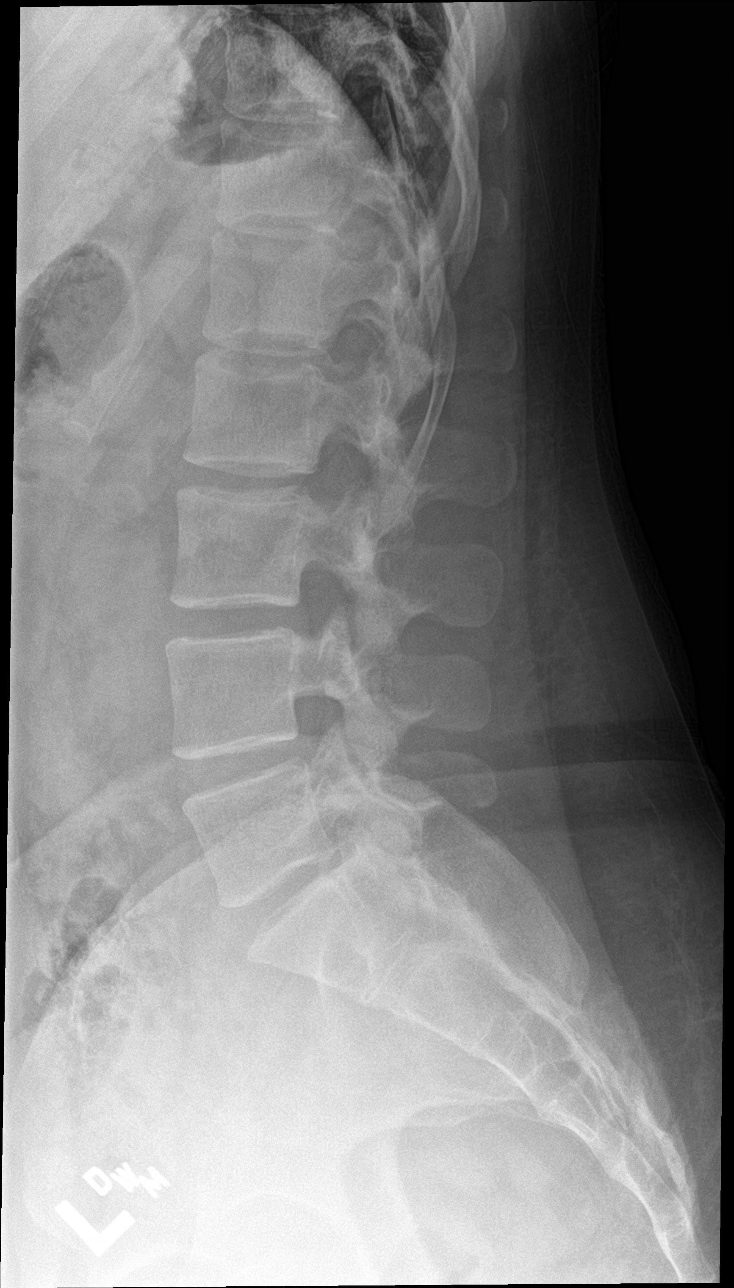

[l-spine spot]
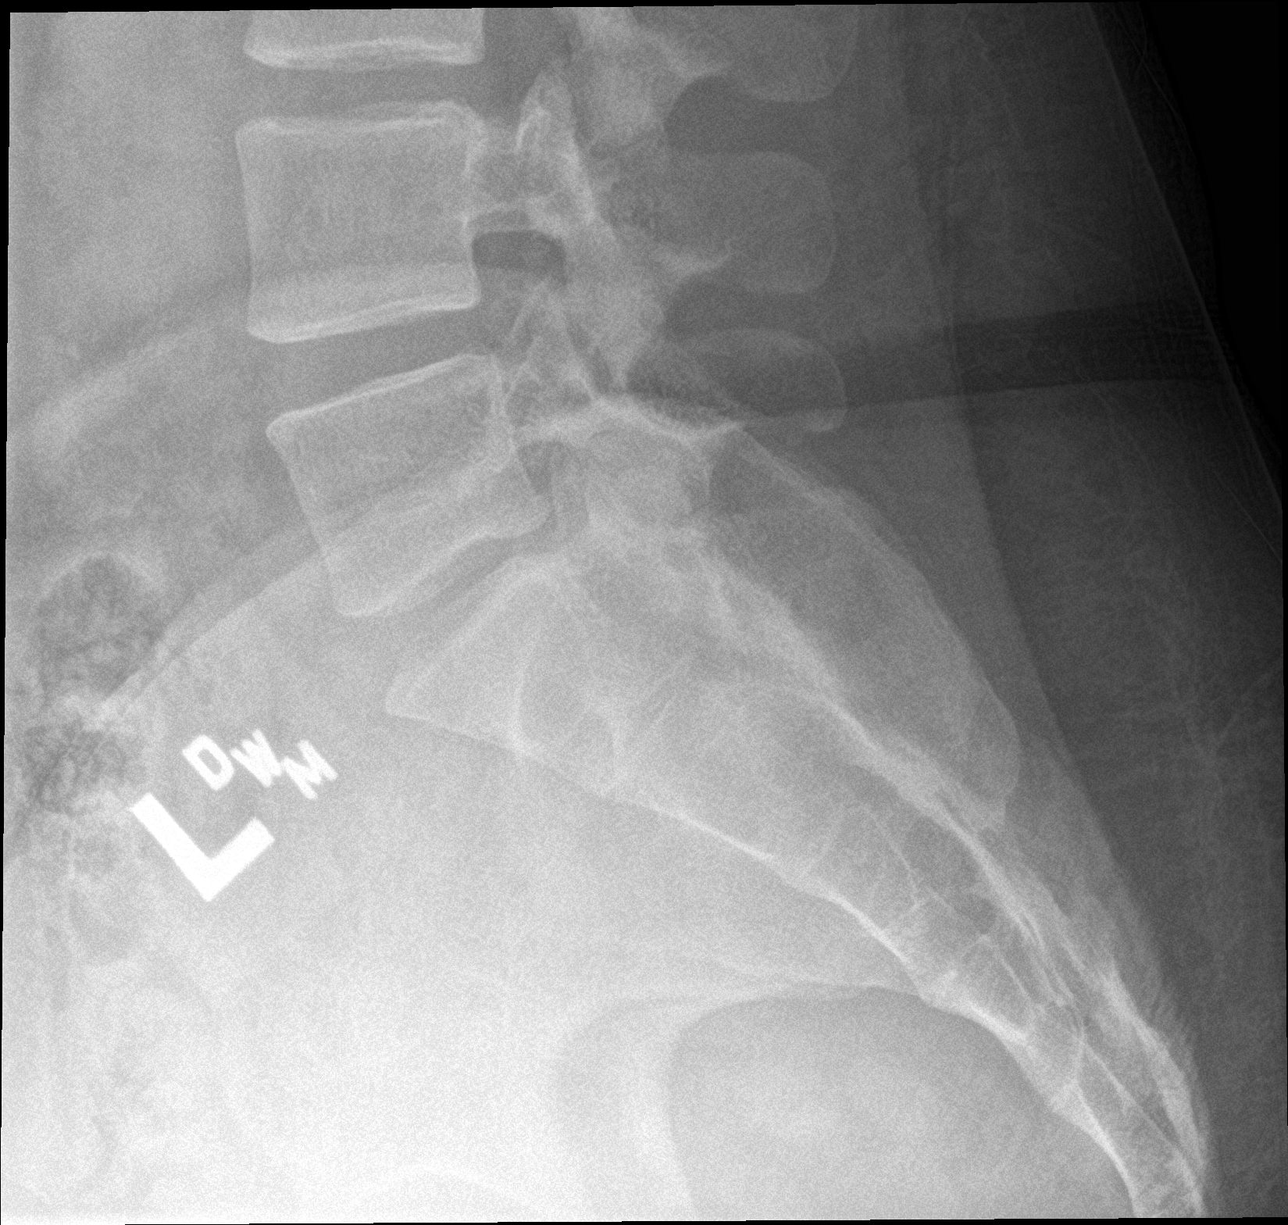

[5 of 5 positions shown; findings below may reference images not displayed]

FINDINGS: Five non-rib-bearing lumbar vertebrae. The L1 vertebra is mildly
transitional. Mild dextroconvex thoracolumbar rotary scoliosis.
Otherwise, normal appearing bones.
IMPRESSION: Mild scoliosis.  Otherwise, normal examination.

## 2015-01-22 MED ORDER — METHYLPHENIDATE HCL ER (OSM) 27 MG PO TBCR: 27.0000 mg | EXTENDED_RELEASE_TABLET | ORAL | Status: DC

## 2015-01-22 MED ORDER — CYCLOBENZAPRINE HCL 10 MG PO TABS: 10.0000 mg | ORAL_TABLET | Freq: Three times a day (TID) | ORAL | Status: DC | PRN

## 2015-01-22 MED ORDER — DICLOFENAC SODIUM 2 % TD SOLN: TRANSDERMAL | Status: DC

## 2015-01-22 MED ORDER — MELOXICAM 15 MG PO TABS: 15.0000 mg | ORAL_TABLET | Freq: Every day | ORAL | Status: DC

## 2015-01-22 NOTE — Progress Notes (Signed)
   Subjective:    Patient ID: Jocelyn Taylor, female    DOB: 1985-09-17, 29 y.o.   MRN: [id number]  HPI Patient is a 29 year old female who presents to the clinic for 3 month refill on ADHD medications. She also has a few other concern sure address.  ADHD-patient is doing great on Concerta. She is staying well focused at work and [employer]. She has no complaints or concerns. She denies any side effects from the stimulant.  Hashimoto's thyroiditis/hypothyroidism-patient is on thyroid medication. She also has multiple thyroid nodules. She usually gets these checked once a year with ultrasound. She denies any symptoms of changes in her thyroid level. She does need a refill.  SI joint dysfunction bilateral-patient reports that she still having some symptoms off and on. She does have a history of scoliosis. She uses the patient said it seems to help a lot. She did have a recent exacerbation which cause some twinging in her lower back. She denies any radiation into her legs. She denies any saddle anesthesia or bladder dysfunction. She's not taken anything other than Pennsaid. She has seen a chiropractor for the last 6 years but doesn't know other intervention.   Review of Systems  All other systems reviewed and are negative.      Objective:   Physical Exam  Constitutional: She is oriented to person, place, and time. She appears well-developed and well-nourished.  HENT:  Head: Normocephalic and atraumatic.  Neck: Normal range of motion. Neck supple. Thyromegaly present.  Cardiovascular: Normal rate, regular rhythm and normal heart sounds.   Pulmonary/Chest: Effort normal and breath sounds normal.  Musculoskeletal:  Patient reports some lower spinal tenderness to palpation. She also has some tightness in her bilateral paraspinous muscles. There is pinpoint tenderness over bilateral SI joints. She also has some bilateral IT band tightness.  Neurological: She is alert and oriented to person,  place, and time.  Psychiatric: She has a normal mood and affect. Her behavior is normal.          Assessment & Plan:  ADHD-3 month refills were given for patient. Follow-up in 3 months.  Multiple thyroid nodules/Hashimoto's thyroiditis-we'll recheck thyroid today. I went ahead and submit it ordered for thyroid ultrasound. She can schedule for her convenience. We'll adjust levothyroxin as we get labs back.  Sacroiliac joint dysfunction bilateraly/scolosis-will get lumbar x-ray today. I do think patient benefit from physical therapy. She has agreed for me to make that referral. Patient said was refilled. I did give patient meloxicam to use as needed once daily for any worsening low back symptoms. I also did give her Flexeril to use as needed for back spasms. Follow-up in 4-6 weeks to see if there's any improvement with physical therapy if there is not we can refer to sports medicine for possible SI joint injection.

## 2015-01-23 LAB — TSH: TSH: 2.292 u[IU]/mL (ref 0.350–4.500)

## 2015-01-25 MED ORDER — LEVOTHYROXINE SODIUM 75 MCG PO TABS: 75.0000 ug | ORAL_TABLET | Freq: Every day | ORAL | Status: DC

## 2015-01-25 NOTE — Addendum Note (Signed)
Addended by: Jomarie LongsBREEBACK, JADE L on: 01/25/2015 07:39 AM   Modules accepted: Orders

## 2015-01-28 ENCOUNTER — Encounter: Payer: Self-pay | Admitting: Physician Assistant

## 2015-01-29 ENCOUNTER — Ambulatory Visit (INDEPENDENT_AMBULATORY_CARE_PROVIDER_SITE_OTHER): Payer: 59

## 2015-01-29 DIAGNOSIS — E042 Nontoxic multinodular goiter: Secondary | ICD-10-CM

## 2015-01-29 DIAGNOSIS — E063 Autoimmune thyroiditis: Secondary | ICD-10-CM | POA: Diagnosis not present

## 2015-01-29 DIAGNOSIS — E039 Hypothyroidism, unspecified: Secondary | ICD-10-CM

## 2015-02-01 ENCOUNTER — Other Ambulatory Visit: Payer: Self-pay | Admitting: Physician Assistant

## 2015-02-01 MED ORDER — AMPHETAMINE-DEXTROAMPHETAMINE 10 MG PO TABS: 10.0000 mg | ORAL_TABLET | Freq: Two times a day (BID) | ORAL | Status: DC

## 2015-02-02 ENCOUNTER — Other Ambulatory Visit: Payer: Self-pay | Admitting: Physician Assistant

## 2015-02-02 MED ORDER — AMPHETAMINE-DEXTROAMPHETAMINE 20 MG PO TABS: 20.0000 mg | ORAL_TABLET | Freq: Two times a day (BID) | ORAL | Status: DC

## 2015-02-05 ENCOUNTER — Telehealth: Payer: Self-pay | Admitting: Physician Assistant

## 2015-02-05 NOTE — Telephone Encounter (Signed)
Received fax for prior authorization on Amphetamine-Dextroamphetamine sent through cover my meds and received approval. Case ID [id number]. - Pharmacy has been notified. - CF

## 2015-04-05 ENCOUNTER — Other Ambulatory Visit: Payer: Self-pay | Admitting: Physician Assistant

## 2015-04-05 MED ORDER — AMPHETAMINE-DEXTROAMPHETAMINE 20 MG PO TABS: 20.0000 mg | ORAL_TABLET | Freq: Two times a day (BID) | ORAL | Status: DC

## 2015-05-07 ENCOUNTER — Encounter: Payer: Self-pay | Admitting: Physician Assistant

## 2015-05-07 ENCOUNTER — Ambulatory Visit (INDEPENDENT_AMBULATORY_CARE_PROVIDER_SITE_OTHER): Payer: BLUE CROSS/BLUE SHIELD | Admitting: Physician Assistant

## 2015-05-07 VITALS — BP 129/61 | HR 95 | Ht 65.0 in | Wt 179.0 lb

## 2015-05-07 DIAGNOSIS — Z131 Encounter for screening for diabetes mellitus: Secondary | ICD-10-CM

## 2015-05-07 DIAGNOSIS — Z13220 Encounter for screening for lipoid disorders: Secondary | ICD-10-CM | POA: Diagnosis not present

## 2015-05-07 DIAGNOSIS — Z0000 Encounter for general adult medical examination without abnormal findings: Secondary | ICD-10-CM | POA: Diagnosis not present

## 2015-05-07 MED ORDER — AMPHETAMINE-DEXTROAMPHETAMINE 20 MG PO TABS: 20.0000 mg | ORAL_TABLET | Freq: Two times a day (BID) | ORAL | Status: DC

## 2015-05-07 NOTE — Patient Instructions (Signed)

## 2015-05-10 NOTE — Progress Notes (Signed)
  Subjective:     Jocelyn Taylor is a 30 y.o. female and is here for a comprehensive physical exam. The patient reports no problems.  Social History   Social History  . Marital Status: Married    Spouse Name: N/A  . Number of Children: N/A  . Years of Education: N/A   Occupational History  . Not on file.   Social History Main Topics  . Smoking status: Never Smoker   . Smokeless tobacco: Not on file  . Alcohol Use: Yes  . Drug Use: No  . Sexual Activity: Yes   Other Topics Concern  . Not on file   Social History Narrative   Health Maintenance  Topic Date Due  . HIV Screening  11/06/2001  . INFLUENZA VACCINE  09/21/2015  . PAP SMEAR  06/23/2016  . TETANUS/TDAP  08/21/2022    The following portions of the patient's history were reviewed and updated as appropriate: allergies, current medications, past family history, past medical history, past social history, past surgical history and problem list.  Review of Systems A comprehensive review of systems was negative.   Objective:    BP 129/61 mmHg  Pulse 95  Ht 5\' 5"  (1.651 m)  Wt 179 lb (81.194 kg)  BMI 29.79 kg/m2 General appearance: alert, cooperative and appears stated age Head: Normocephalic, without obvious abnormality, atraumatic Eyes: conjunctivae/corneas clear. PERRL, EOM's intact. Fundi benign. Ears: normal TM's and external ear canals both ears Nose: Nares normal. Septum midline. Mucosa normal. No drainage or sinus tenderness. Throat: lips, mucosa, and tongue normal; teeth and gums normal Neck: no adenopathy, no carotid bruit, no JVD, supple, symmetrical, trachea midline and thyroid not enlarged, symmetric, no tenderness/mass/nodules Back: symmetric, no curvature. ROM normal. No CVA tenderness. Lungs: clear to auscultation bilaterally Heart: regular rate and rhythm, S1, S2 normal, no murmur, click, rub or gallop Abdomen: soft, non-tender; bowel sounds normal; no masses,  no organomegaly Extremities:  extremities normal, atraumatic, no cyanosis or edema Pulses: 2+ and symmetric Skin: Skin color, texture, turgor normal. No rashes or lesions Lymph nodes: Cervical, supraclavicular, and axillary nodes normal. Neurologic: Grossly normal    Assessment:    Healthy female exam.      Plan:    CPE- scheduled pap with GYN. Ordered lipid and cmp. Encouraged vitamin D 800units and calcium 1500mg . Discussed healthy diet and regular exercise.   ADHD- refilled adderall for 3 months. Doing great and controlled.no side effects. Vitals look great.  See After Visit Summary for Counseling Recommendations

## 2015-05-13 ENCOUNTER — Encounter: Payer: Self-pay | Admitting: Physician Assistant

## 2015-05-14 ENCOUNTER — Other Ambulatory Visit: Payer: Self-pay | Admitting: *Deleted

## 2015-05-14 ENCOUNTER — Other Ambulatory Visit: Payer: Self-pay | Admitting: Physician Assistant

## 2015-05-14 MED ORDER — VALACYCLOVIR HCL 1 G PO TABS: 1000.0000 mg | ORAL_TABLET | Freq: Two times a day (BID) | ORAL | Status: DC

## 2015-05-14 MED ORDER — MUPIROCIN 2 % EX OINT: TOPICAL_OINTMENT | CUTANEOUS | Status: DC

## 2015-05-14 MED ORDER — ACYCLOVIR 200 MG PO CAPS: 200.0000 mg | ORAL_CAPSULE | Freq: Every day | ORAL | Status: DC

## 2015-05-20 LAB — COMPLETE METABOLIC PANEL WITH GFR
ALK PHOS: 44 U/L (ref 33–115)
ALT: 12 U/L (ref 6–29)
AST: 14 U/L (ref 10–30)
Albumin: 4.2 g/dL (ref 3.6–5.1)
BUN: 13 mg/dL (ref 7–25)
CALCIUM: 9.3 mg/dL (ref 8.6–10.2)
CO2: 24 mmol/L (ref 20–31)
CREATININE: 0.72 mg/dL (ref 0.50–1.10)
Chloride: 105 mmol/L (ref 98–110)
GFR, Est Non African American: >89 mL/min (ref >=60)
Glucose, Bld: 87 mg/dL (ref 65–99)
POTASSIUM: 4.4 mmol/L (ref 3.5–5.3)
Sodium: 137 mmol/L (ref 135–146)
Total Bilirubin: 0.5 mg/dL (ref 0.2–1.2)
Total Protein: 7.4 g/dL (ref 6.1–8.1)

## 2015-05-20 LAB — LIPID PANEL
CHOL/HDL RATIO: 3.6 ratio (ref <=5.0)
Cholesterol: 188 mg/dL (ref 125–200)
HDL: 52 mg/dL (ref >=46)
LDL CALC: 119 mg/dL (ref <130)
TRIGLYCERIDES: 85 mg/dL (ref <150)
VLDL: 17 mg/dL (ref <30)

## 2015-05-29 ENCOUNTER — Other Ambulatory Visit: Payer: Self-pay | Admitting: Physician Assistant

## 2015-06-01 MED ORDER — ACYCLOVIR 200 MG PO CAPS: 200.0000 mg | ORAL_CAPSULE | Freq: Every day | ORAL | Status: DC

## 2015-06-10 ENCOUNTER — Other Ambulatory Visit: Payer: Self-pay | Admitting: *Deleted

## 2015-06-23 NOTE — Telephone Encounter (Signed)
error 

## 2015-07-07 ENCOUNTER — Encounter: Payer: Self-pay | Admitting: Physician Assistant

## 2015-07-07 ENCOUNTER — Other Ambulatory Visit: Payer: Self-pay | Admitting: Physician Assistant

## 2015-07-07 MED ORDER — AMOXICILLIN-POT CLAVULANATE 875-125 MG PO TABS: 1.0000 | ORAL_TABLET | Freq: Two times a day (BID) | ORAL | Status: DC

## 2015-10-24 ENCOUNTER — Encounter: Payer: Self-pay | Admitting: Emergency Medicine

## 2015-10-24 ENCOUNTER — Emergency Department (INDEPENDENT_AMBULATORY_CARE_PROVIDER_SITE_OTHER)
Admission: EM | Admit: 2015-10-24 | Discharge: 2015-10-24 | Disposition: A | Discharge status: Home / Self Care | Payer: BLUE CROSS/BLUE SHIELD | Source: Home / Self Care | Attending: Family Medicine | Admitting: Family Medicine

## 2015-10-24 DIAGNOSIS — B349 Viral infection, unspecified: Secondary | ICD-10-CM

## 2015-10-24 LAB — POCT RAPID STREP A (OFFICE): RAPID STREP A SCREEN: NEGATIVE

## 2015-10-24 NOTE — ED Provider Notes (Signed)
[facility]    CSN: [id number] Arrival date & time: 10/24/15  1709  First Provider Contact:  First MD Initiated Contact with Patient 10/24/15 1734        History   Chief Complaint Chief Complaint  Patient presents with  . URI    HPI Jocelyn Taylor is a 30 y.o. female.   Last night patient developed sore throat, fatigue, right sided headache, and chills.  Today she had nausea without vomiting.   The history is provided by the patient.    Past Medical History:  Diagnosis Date  . Goiter     Patient Active Problem List   Diagnosis Date Noted  . Idiopathic scoliosis 01/22/2015  . Sacroiliac joint dysfunction of both sides 10/26/2014  . Adult ADHD 06/23/2014  . Anxiety 11/21/2013  . Inattention 11/21/2013  . Hashimoto's thyroiditis 11/12/2013  . Unspecified hypothyroidism 11/11/2013  . Multiple thyroid nodules 11/07/2013    Past Surgical History:  Procedure Laterality Date  . BREAST BIOPSY Right 06/2006    OB History    No data available       Home Medications    Prior to Admission medications   Medication Sig Start Date End Date Taking? Authorizing Provider  amphetamine-dextroamphetamine (ADDERALL) 20 MG tablet Take 1 tablet (20 mg total) by mouth 2 (two) times daily. Do not refill until 07/07/15 05/07/15   Jomarie Longs, PA-C  levothyroxine (SYNTHROID, LEVOTHROID) 75 MCG tablet Take 1 tablet (75 mcg total) by mouth daily before breakfast. Need follow up visit for more refills 01/25/15   Jomarie Longs, PA-C    Family History Family History  Problem Relation Age of Onset  . Cancer Mother     ovarian    Social History Social History  Substance Use Topics  . Smoking status: Never Smoker  . Smokeless tobacco: Never Used  . Alcohol use Yes     Allergies   Review of patient's allergies indicates no known allergies.   Review of Systems Review of Systems + sore throat No cough No pleuritic pain No wheezing ? nasal congestion ?  post-nasal drainage No sinus pain/pressure No itchy/red eyes No earache No hemoptysis No SOB No fever, + chills + nausea No vomiting No abdominal pain No diarrhea No urinary symptoms No skin rash + fatigue + myalgias + headache Used OTC meds without relief   Physical Exam Triage Vital Signs ED Triage Vitals  Enc Vitals Group     BP 10/24/15 1726 130/90     Pulse Rate 10/24/15 1726 95     Resp --      Temp 10/24/15 1726 99.1 F (37.3 C)     Temp Source 10/24/15 1726 Oral     SpO2 10/24/15 1726 99 %     Weight 10/24/15 1727 176 lb 8 oz (80.1 kg)     Height 10/24/15 1727 5\' 5"  (1.651 m)     Head Circumference --      Peak Flow --      Pain Score 10/24/15 1729 6     Pain Loc --      Pain Edu? --      Excl. in GC? --    No data found.   Updated Vital Signs BP 130/90 (BP Location: Left Arm)   Pulse 95   Temp 99.1 F (37.3 C) (Oral)   Ht 5\' 5"  (1.651 m)   Wt 176 lb 8 oz (80.1 kg)   LMP 09/23/2015 (Exact Date)   SpO2  99%   BMI 29.37 kg/m   Visual Acuity Right Eye Distance:   Left Eye Distance:   Bilateral Distance:    Right Eye Near:   Left Eye Near:    Bilateral Near:     Physical Exam Nursing notes and Vital Signs reviewed. Appearance:  Patient appears stated age, and in no acute distress Eyes:  Pupils are equal, round, and reactive to light and accomodation.  Extraocular movement is intact.  Conjunctivae are not inflamed  Ears:  Canals normal.  Tympanic membranes normal.  Nose:  Mildly congested turbinates.  No sinus tenderness.   Pharynx:   Minimal erythema Neck:  Supple.  Tender enlarged posterior/lateral nodes are palpated bilaterally.  Tonsillar nodes are slightly tender.  Lungs:  Clear to auscultation.  Breath sounds are equal.  Moving air well. Heart:  Regular rate and rhythm without murmurs, rubs, or gallops.  Abdomen:  Nontender without masses or hepatosplenomegaly.  Bowel sounds are present.  No CVA or flank tenderness.  Extremities:  No  edema.  Skin:  No rash present.    UC Treatments / Results  Labs (all labs ordered are listed, but only abnormal results are displayed) Labs Reviewed  STREP A DNA PROBE  POCT RAPID STREP A (OFFICE) negative    EKG  EKG Interpretation None       Radiology No results found.  Procedures Procedures (including critical care time)  Medications Ordered in UC Medications - No data to display   Initial Impression / Assessment and Plan / UC Course  I have reviewed the triage vital signs and the nursing notes.  Pertinent labs & imaging results that were available during my care of the patient were reviewed by me and considered in my medical decision making (see chart for details).  Clinical Course  There is no evidence of bacterial infection today.   Throat culture pending If cough develops, take plain guaifenesin (1200mg  extended release tabs such as Mucinex) twice daily, with plenty of water, for cough and congestion.  May add Pseudoephedrine (30mg , one or two every 4 to 6 hours) for sinus congestion.  Get adequate rest.   May use Afrin nasal spray (or generic oxymetazoline) twice daily for about 5 days and then discontinue.  Also recommend using saline nasal spray several times daily and saline nasal irrigation (AYR is a common brand).  Use Flonase nasal spray each morning after using Afrin nasal spray and saline nasal irrigation. Try warm salt water gargles for sore throat.  Stop all antihistamines for now, and other non-prescription cough/cold preparations. May take Ibuprofen 200mg , 4 tabs every 8 hours with food for headache, body aches, fever, etc.   Follow-up with family doctor if not improving about 5 days.      Final Clinical Impressions(s) / UC Diagnoses   Final diagnoses:  Acute viral syndrome    New Prescriptions New Prescriptions   No medications on file     Lattie HawStephen A Shonette Rhames, MD 11/03/15 1447

## 2015-10-24 NOTE — ED Triage Notes (Signed)
Pt c/o head pressure x 1 day, worse with bending, body aches, sore throat, no ear pain.

## 2015-10-24 NOTE — Discharge Instructions (Signed)
If cough develops, take plain guaifenesin (1200mg  extended release tabs such as Mucinex) twice daily, with plenty of water, for cough and congestion.  May add Pseudoephedrine (30mg , one or two every 4 to 6 hours) for sinus congestion.  Get adequate rest.   May use Afrin nasal spray (or generic oxymetazoline) twice daily for about 5 days and then discontinue.  Also recommend using saline nasal spray several times daily and saline nasal irrigation (AYR is a common brand).  Use Flonase nasal spray each morning after using Afrin nasal spray and saline nasal irrigation. Try warm salt water gargles for sore throat.  Stop all antihistamines for now, and other non-prescription cough/cold preparations. May take Ibuprofen 200mg , 4 tabs every 8 hours with food for headache, body aches, fever, etc.   Follow-up with family doctor if not improving about 5 days.

## 2015-10-26 ENCOUNTER — Encounter: Payer: Self-pay | Admitting: Physician Assistant

## 2015-10-26 LAB — STREP A DNA PROBE: GASP: NOT DETECTED

## 2015-10-27 ENCOUNTER — Telehealth: Payer: Self-pay | Admitting: *Deleted

## 2015-10-27 NOTE — Telephone Encounter (Signed)
Spoke to pt given Tcx results. She is feeling some better today. Advised her to call back if she has any questions or concerns.

## 2015-12-14 ENCOUNTER — Ambulatory Visit (INDEPENDENT_AMBULATORY_CARE_PROVIDER_SITE_OTHER): Payer: BLUE CROSS/BLUE SHIELD | Admitting: Physician Assistant

## 2015-12-14 ENCOUNTER — Encounter: Payer: Self-pay | Admitting: Physician Assistant

## 2015-12-14 VITALS — BP 126/72 | HR 107 | Ht 65.0 in | Wt 174.0 lb

## 2015-12-14 DIAGNOSIS — F909 Attention-deficit hyperactivity disorder, unspecified type: Secondary | ICD-10-CM | POA: Diagnosis not present

## 2015-12-14 DIAGNOSIS — E039 Hypothyroidism, unspecified: Secondary | ICD-10-CM | POA: Diagnosis not present

## 2015-12-14 MED ORDER — AMPHETAMINE-DEXTROAMPHETAMINE 20 MG PO TABS: 20.0000 mg | ORAL_TABLET | Freq: Two times a day (BID) | ORAL | 0 refills | Status: DC

## 2015-12-14 NOTE — Progress Notes (Signed)
   Subjective:    Patient ID: Jocelyn Taylor, female    DOB: 1985-09-26, [age] y.o.   MRN: [id number]  HPI Pt is a 30 yo female who presents to the clinic for 3 month ADHD refill. Doing well on adderall bid. She splits in half at times to make last longer since she doesn't have insurance. She denies any CP, palpitations, headaches, anorexia, or insomnia.   She is hypothyroid but not having any problems. On 75mcg daily. Taking daily. Last checked 01/2015.    Review of Systems  All other systems reviewed and are negative.      Objective:   Physical Exam  Constitutional: She is oriented to person, place, and time. She appears well-developed and well-nourished.  HENT:  Head: Normocephalic and atraumatic.  Cardiovascular: Normal rate, regular rhythm and normal heart sounds.   Pulmonary/Chest: Effort normal and breath sounds normal.  Neurological: She is alert and oriented to person, place, and time.  Psychiatric: She has a normal mood and affect. Her behavior is normal.          Assessment & Plan:  Adult ADHD- refilled for 3 months adderall. Vitals great after 2nd recheck.   Hypothyroidism- Ts[facility] lab ordered will refill accordingly.

## 2016-01-29 LAB — TSH: TSH: 1.62 mIU/L

## 2016-01-31 ENCOUNTER — Other Ambulatory Visit: Payer: Self-pay | Admitting: *Deleted

## 2016-01-31 MED ORDER — LEVOTHYROXINE SODIUM 75 MCG PO TABS: 75.0000 ug | ORAL_TABLET | Freq: Every day | ORAL | 1 refills | Status: DC

## 2016-02-05 ENCOUNTER — Encounter: Payer: Self-pay | Admitting: Physician Assistant

## 2016-02-07 ENCOUNTER — Other Ambulatory Visit: Payer: Self-pay | Admitting: *Deleted

## 2016-02-07 MED ORDER — LEVOTHYROXINE SODIUM 75 MCG PO TABS: 75.0000 ug | ORAL_TABLET | Freq: Every day | ORAL | 1 refills | Status: DC

## 2016-07-03 ENCOUNTER — Other Ambulatory Visit: Payer: Self-pay | Admitting: Physician Assistant

## 2016-07-06 ENCOUNTER — Other Ambulatory Visit: Payer: Self-pay | Admitting: *Deleted

## 2016-07-06 MED ORDER — AMPHETAMINE-DEXTROAMPHETAMINE 20 MG PO TABS: 20.0000 mg | ORAL_TABLET | Freq: Two times a day (BID) | ORAL | 0 refills | Status: DC

## 2016-07-19 ENCOUNTER — Ambulatory Visit: Payer: BLUE CROSS/BLUE SHIELD | Admitting: Physician Assistant

## 2016-07-21 ENCOUNTER — Ambulatory Visit (INDEPENDENT_AMBULATORY_CARE_PROVIDER_SITE_OTHER): Payer: BLUE CROSS/BLUE SHIELD | Admitting: Physician Assistant

## 2016-07-21 ENCOUNTER — Encounter: Payer: Self-pay | Admitting: Physician Assistant

## 2016-07-21 VITALS — BP 132/82 | HR 96 | Ht 65.0 in | Wt 174.0 lb

## 2016-07-21 DIAGNOSIS — E063 Autoimmune thyroiditis: Secondary | ICD-10-CM | POA: Diagnosis not present

## 2016-07-21 DIAGNOSIS — F909 Attention-deficit hyperactivity disorder, unspecified type: Secondary | ICD-10-CM | POA: Diagnosis not present

## 2016-07-21 MED ORDER — LEVOTHYROXINE SODIUM 50 MCG PO TABS: 50.0000 ug | ORAL_TABLET | Freq: Every day | ORAL | 0 refills | Status: DC

## 2016-07-21 MED ORDER — AMPHETAMINE-DEXTROAMPHETAMINE 20 MG PO TABS: 20.0000 mg | ORAL_TABLET | Freq: Two times a day (BID) | ORAL | 0 refills | Status: DC

## 2016-07-21 NOTE — Progress Notes (Signed)
   Subjective:    Patient ID: Jocelyn Taylor, female    DOB: 05/27/1985, 31 y.o.   MRN: [id number]  HPI Pt is a 31 yo female who presents to the clinic to follow up on ADHD. She is doing great on current dose. She denies any headaches, anxiety, palpitations.   Hypothyroidism- she has been on levothyroxine for the last 4-5 years and really not felt any different. She wants to try anti-inflammatory diet and decrease levothyroxine and see how she does.   .. Active Ambulatory Problems    Diagnosis Date Noted  . Multiple thyroid nodules 11/07/2013  . Unspecified hypothyroidism 11/11/2013  . Hashimoto's thyroiditis 11/12/2013  . Anxiety 11/21/2013  . Inattention 11/21/2013  . Adult ADHD 06/23/2014  . Sacroiliac joint dysfunction of both sides 10/26/2014  . Idiopathic scoliosis 01/22/2015   Resolved Ambulatory Problems    Diagnosis Date Noted  . No Resolved Ambulatory Problems   Past Medical History:  Diagnosis Date  . Goiter    .[name]. Family History  Problem Relation Age of Onset  . Cancer Mother        ovarian      Review of Systems  All other systems reviewed and are negative.      Objective:   Physical Exam  Constitutional: She is oriented to person, place, and time. She appears well-developed and well-nourished.  HENT:  Head: Normocephalic and atraumatic.  Cardiovascular: Normal rate, regular rhythm and normal heart sounds.   Pulmonary/Chest: Effort normal and breath sounds normal.  Neurological: She is alert and oriented to person, place, and time.  Psychiatric: She has a normal mood and affect. Her behavior is normal.          Assessment & Plan:  [name].[name].Jocelyn Taylor was seen today for adhd.  Diagnoses and all orders for this visit:  Hashimoto's thyroiditis -     levothyroxine (SYNTHROID, LEVOTHROID) 50 MCG tablet; Take 1 tablet (50 mcg total) by mouth daily.  Adult ADHD -     amphetamine-dextroamphetamine (ADDERALL) 20 MG tablet; Take 1 tablet (20 mg total) by mouth  2 (two) times daily.  Other orders -     Discontinue: amphetamine-dextroamphetamine (ADDERALL) 20 MG tablet; Take 1 tablet (20 mg total) by mouth 2 (two) times daily. -     Discontinue: amphetamine-dextroamphetamine (ADDERALL) 20 MG tablet; Take 1 tablet (20 mg total) by mouth 2 (two) times daily.   Will decrease levothyroxine and recheck labs in 3 months. Encouraged trying anti-inflammatory diet and see if antibodies decrease. Consider tumeric as an anti-inflammatory or start cooking with it.

## 2016-12-06 ENCOUNTER — Encounter: Payer: Self-pay | Admitting: Physician Assistant

## 2016-12-06 DIAGNOSIS — E063 Autoimmune thyroiditis: Secondary | ICD-10-CM

## 2016-12-07 MED ORDER — LEVOTHYROXINE SODIUM 50 MCG PO TABS: 50.0000 ug | ORAL_TABLET | Freq: Every day | ORAL | 0 refills | Status: DC

## 2017-02-09 ENCOUNTER — Encounter: Payer: Self-pay | Admitting: Physician Assistant

## 2017-02-09 ENCOUNTER — Ambulatory Visit: Payer: BLUE CROSS/BLUE SHIELD | Admitting: Physician Assistant

## 2017-02-09 VITALS — BP 128/82 | HR 83

## 2017-02-09 DIAGNOSIS — E063 Autoimmune thyroiditis: Secondary | ICD-10-CM | POA: Diagnosis not present

## 2017-02-09 DIAGNOSIS — R229 Localized swelling, mass and lump, unspecified: Secondary | ICD-10-CM | POA: Diagnosis not present

## 2017-02-09 NOTE — Progress Notes (Signed)
   Subjective:    Patient ID: Jocelyn Taylor, female    DOB: 02/22/1985, 31 y.o.   MRN: [id number]  HPI Pt is a 31 yo pregnant female in her 2nd trimester who presents to the clinic for follow up on hypothyroidism.   Hypothyroidism- she would like to come off levothyroxine all together. She admits to tapering herself. She is down to 25mcg. She does not feel any difference. She denies any skin changes or mood changes.   She is concerned about a skin nodule in her epigastric area. She has tried to express material out of it and had a little success. Been present for over a year. It is now hard and she doesn't like it at all. It does not appear to be getting bigger.   .. Active Ambulatory Problems    Diagnosis Date Noted  . Multiple thyroid nodules 11/07/2013  . Unspecified hypothyroidism 11/11/2013  . Hashimoto's thyroiditis 11/12/2013  . Anxiety 11/21/2013  . Inattention 11/21/2013  . Adult ADHD 06/23/2014  . Sacroiliac joint dysfunction of both sides 10/26/2014  . Idiopathic scoliosis 01/22/2015  . Skin nodule 02/10/2017   Resolved Ambulatory Problems    Diagnosis Date Noted  . No Resolved Ambulatory Problems   Past Medical History:  Diagnosis Date  . Goiter       Review of Systems  All other systems reviewed and are negative.      Objective:   Physical Exam  Constitutional: She is oriented to person, place, and time. She appears well-developed and well-nourished.  HENT:  Head: Normocephalic and atraumatic.  Neck: Normal range of motion. Neck supple. No thyromegaly present.  Cardiovascular: Normal rate, regular rhythm and normal heart sounds.  Pulmonary/Chest: Effort normal and breath sounds normal.  Neurological: She is alert and oriented to person, place, and time.  Skin:     Psychiatric: She has a normal mood and affect. Her behavior is normal.          Assessment & Plan:  [name].[name].Diagnoses and all orders for this visit:  Hashimoto's thyroiditis -      TSH  Skin nodule   Discussed hypothyroidism. Likely this will not just go away. Lets recheck TSH and see where you at. If TSH in normal range then can come off levothyroxine and recheck in 4 weeks. Mentioned diet changes to help with the autoimmune variant of hashimotos. Certainly pregnancy can change TSH levels as well. We need to check TSH more frequently during pregnancy.   Discussed skin lesion appears like dermatofibroma. Discussed benign nature and options to treat. HO given. Follow up as needed or if lesion gets bigger or changes.if changing we can biopsy it.

## 2017-02-10 DIAGNOSIS — R229 Localized swelling, mass and lump, unspecified: Secondary | ICD-10-CM | POA: Insufficient documentation

## 2017-02-10 LAB — TSH: TSH: 3.09 m[IU]/L

## 2017-02-14 ENCOUNTER — Encounter: Payer: Self-pay | Admitting: Physician Assistant

## 2017-03-13 ENCOUNTER — Encounter: Payer: Self-pay | Admitting: Physician Assistant

## 2017-03-13 DIAGNOSIS — E039 Hypothyroidism, unspecified: Secondary | ICD-10-CM

## 2017-03-17 LAB — TSH: TSH: 3.44 m[IU]/L

## 2017-04-03 ENCOUNTER — Encounter: Payer: Self-pay | Admitting: Physician Assistant

## 2017-05-17 ENCOUNTER — Encounter: Payer: Self-pay | Admitting: *Deleted

## 2017-05-18 MED ORDER — BENZOCAINE-MENTHOL 15-3.6 MG MT LOZG
LOZENGE | OROMUCOSAL | Status: DC
Start: ? — End: 2017-05-18

## 2017-05-18 MED ORDER — MAGNESIUM HYDROXIDE 400 MG/5ML PO SUSP
30.00 | ORAL | Status: DC
Start: ? — End: 2017-05-18

## 2017-05-18 MED ORDER — GENERIC EXTERNAL MEDICATION
Status: DC
Start: ? — End: 2017-05-18

## 2017-05-18 MED ORDER — SALINE NASAL SPRAY 0.65 % NA SOLN
NASAL | Status: DC
Start: ? — End: 2017-05-18

## 2017-05-18 MED ORDER — HYDROCODONE-ACETAMINOPHEN 10-325 MG PO TABS
ORAL_TABLET | ORAL | Status: DC
Start: ? — End: 2017-05-18

## 2017-05-18 MED ORDER — IBUPROFEN 800 MG PO TABS
800.00 | ORAL_TABLET | ORAL | Status: DC
Start: 2017-05-16 — End: 2017-05-18

## 2017-05-18 MED ORDER — MORPHINE SULFATE (PF) 4 MG/ML IV SOLN
4.00 | INTRAVENOUS | Status: DC
Start: ? — End: 2017-05-18

## 2017-05-18 MED ORDER — DOCUSATE SODIUM 100 MG PO CAPS
100.00 | ORAL_CAPSULE | ORAL | Status: DC
Start: 2017-05-16 — End: 2017-05-18

## 2017-05-18 MED ORDER — HYDROMORPHONE HCL 1 MG/ML IJ SOLN
1.00 | INTRAMUSCULAR | Status: DC
Start: ? — End: 2017-05-18

## 2017-05-18 MED ORDER — MORPHINE SULFATE (PF) 4 MG/ML IV SOLN
10.00 | INTRAVENOUS | Status: DC
Start: ? — End: 2017-05-18

## 2017-05-18 MED ORDER — HYDROCODONE-ACETAMINOPHEN 5-325 MG PO TABS
ORAL_TABLET | ORAL | Status: DC
Start: ? — End: 2017-05-18

## 2017-05-18 MED ORDER — BISACODYL 10 MG RE SUPP
10.00 | RECTAL | Status: DC
Start: ? — End: 2017-05-18

## 2017-05-18 MED ORDER — OXYCODONE HCL 10 MG PO TABS
10.00 | ORAL_TABLET | ORAL | Status: DC
Start: ? — End: 2017-05-18

## 2017-05-18 MED ORDER — LANOLIN EX OINT
TOPICAL_OINTMENT | CUTANEOUS | Status: DC
Start: ? — End: 2017-05-18

## 2017-05-18 MED ORDER — ANTACID & ANTIGAS 200-200-20 MG/5ML PO SUSP
30.00 | ORAL | Status: DC
Start: ? — End: 2017-05-18

## 2017-05-18 MED ORDER — ZOLPIDEM TARTRATE 5 MG PO TABS
5.00 | ORAL_TABLET | ORAL | Status: DC
Start: ? — End: 2017-05-18

## 2017-05-18 MED ORDER — BENZOCAINE-MENTHOL 20-0.5 % EX AERO
INHALATION_SPRAY | CUTANEOUS | Status: DC
Start: ? — End: 2017-05-18

## 2017-05-18 MED ORDER — SIMETHICONE 80 MG PO CHEW
80.00 | CHEWABLE_TABLET | ORAL | Status: DC
Start: ? — End: 2017-05-18

## 2017-05-18 MED ORDER — PRENATAL VITAMINS 28-0.8 MG PO TABS
ORAL_TABLET | ORAL | Status: DC
Start: 2017-05-16 — End: 2017-05-18

## 2017-05-18 MED ORDER — ACETAMINOPHEN 325 MG PO TABS
650.00 | ORAL_TABLET | ORAL | Status: DC
Start: ? — End: 2017-05-18

## 2017-05-18 MED ORDER — GUAIFENESIN 100 MG/5ML PO LIQD
200.00 | ORAL | Status: DC
Start: ? — End: 2017-05-18

## 2017-12-21 ENCOUNTER — Encounter: Payer: Self-pay | Admitting: Physician Assistant

## 2017-12-21 ENCOUNTER — Ambulatory Visit (INDEPENDENT_AMBULATORY_CARE_PROVIDER_SITE_OTHER): Payer: Managed Care, Other (non HMO) | Admitting: Physician Assistant

## 2017-12-21 VITALS — BP 138/88 | HR 88 | Ht 65.0 in | Wt 191.0 lb

## 2017-12-21 DIAGNOSIS — F900 Attention-deficit hyperactivity disorder, predominantly inattentive type: Secondary | ICD-10-CM

## 2017-12-21 DIAGNOSIS — Z131 Encounter for screening for diabetes mellitus: Secondary | ICD-10-CM | POA: Diagnosis not present

## 2017-12-21 DIAGNOSIS — Z0000 Encounter for general adult medical examination without abnormal findings: Secondary | ICD-10-CM

## 2017-12-21 DIAGNOSIS — E039 Hypothyroidism, unspecified: Secondary | ICD-10-CM | POA: Diagnosis not present

## 2017-12-21 MED ORDER — AMPHETAMINE-DEXTROAMPHETAMINE 20 MG PO TABS: 20.0000 mg | ORAL_TABLET | Freq: Two times a day (BID) | ORAL | 0 refills | Status: DC

## 2017-12-21 NOTE — Patient Instructions (Signed)

## 2017-12-23 ENCOUNTER — Encounter: Payer: Self-pay | Admitting: Physician Assistant

## 2017-12-23 DIAGNOSIS — F900 Attention-deficit hyperactivity disorder, predominantly inattentive type: Secondary | ICD-10-CM | POA: Insufficient documentation

## 2017-12-23 NOTE — Progress Notes (Signed)
Subjective:     Jocelyn Taylor is a 32 y.o. female and is here for a comprehensive physical exam. The patient reports no problems.  Pt would like to restart adderall. She is done breastfeeding. She has been struggling at work to stay focused.   Social History   Socioeconomic History  . Marital status: Married    Spouse name: Not on file  . Number of children: Not on file  . Years of education: Not on file  . Highest education level: Not on file  Occupational History  . Not on file  Social Needs  . Financial resource strain: Not on file  . Food insecurity:    Worry: Not on file    Inability: Not on file  . Transportation needs:    Medical: Not on file    Non-medical: Not on file  Tobacco Use  . Smoking status: Never Smoker  . Smokeless tobacco: Never Used  Substance and Sexual Activity  . Alcohol use: Yes  . Drug use: No  . Sexual activity: Yes  Lifestyle  . Physical activity:    Days per week: Not on file    Minutes per session: Not on file  . Stress: Not on file  Relationships  . Social connections:    Talks on phone: Not on file    Gets together: Not on file    Attends religious service: Not on file    Active member of club or organization: Not on file    Attends meetings of clubs or organizations: Not on file    Relationship status: Not on file  . Intimate partner violence:    Fear of current or ex partner: Not on file    Emotionally abused: Not on file    Physically abused: Not on file    Forced sexual activity: Not on file  Other Topics Concern  . Not on file  Social History Narrative  . Not on file   Health Maintenance  Topic Date Due  . HIV Screening  11/06/2001  . PAP SMEAR  06/23/2016  . INFLUENZA VACCINE  11/30/2018 (Originally 09/20/2017)  . TETANUS/TDAP  08/21/2022    The following portions of the patient's history were reviewed and updated as appropriate: allergies, current medications, past family history, past medical history, past social  history, past surgical history and problem list.  Review of Systems A comprehensive review of systems was negative.   Objective:    BP 138/88   Pulse 88   Ht 5\' 5"  (1.651 m)   Wt 191 lb (86.6 kg)   BMI 31.78 kg/m  General appearance: alert, cooperative and appears stated age Head: Normocephalic, without obvious abnormality, atraumatic Eyes: conjunctivae/corneas clear. PERRL, EOM's intact. Fundi benign. Ears: normal TM's and external ear canals both ears Nose: Nares normal. Septum midline. Mucosa normal. No drainage or sinus tenderness. Throat: lips, mucosa, and tongue normal; teeth and gums normal Neck: no adenopathy, no carotid bruit, no JVD, supple, symmetrical, trachea midline and thyroid not enlarged, symmetric, no tenderness/mass/nodules Back: symmetric, no curvature. ROM normal. No CVA tenderness. Lungs: clear to auscultation bilaterally Heart: regular rate and rhythm, S1, S2 normal, no murmur, click, rub or gallop Abdomen: soft, non-tender; bowel sounds normal; no masses,  no organomegaly Extremities: extremities normal, atraumatic, no cyanosis or edema Pulses: 2+ and symmetric Skin: Skin color, texture, turgor normal. No rashes or lesions Lymph nodes: Cervical, supraclavicular, and axillary nodes normal. Neurologic: Alert and oriented X 3, normal strength and tone. Normal symmetric reflexes.  Normal coordination and gait    Assessment:    Healthy female exam.      Plan:    Marland KitchenMarland KitchenAna was seen today for annual exam.  Diagnoses and all orders for this visit:  Routine physical examination -     Lipid Panel w/reflex Direct LDL -     COMPLETE METABOLIC PANEL WITH GFR -     TSH  Screening for diabetes mellitus -     COMPLETE METABOLIC PANEL WITH GFR  Acquired hypothyroidism -     TSH  ADHD (attention deficit hyperactivity disorder), inattentive type -     amphetamine-dextroamphetamine (ADDERALL) 20 MG tablet; Take 1 tablet (20 mg total) by mouth 2 (two) times daily. -      amphetamine-dextroamphetamine (ADDERALL) 20 MG tablet; Take 1 tablet (20 mg total) by mouth 2 (two) times daily. -     amphetamine-dextroamphetamine (ADDERALL) 20 MG tablet; Take 1 tablet (20 mg total) by mouth 2 (two) times daily.   .. Depression screen [facility] 2/9 12/23/2017  Decreased Interest 0  Down, Depressed, Hopeless 0  PHQ - 2 Score 0    .. Discussed 150 minutes of exercise a week.  Encouraged vitamin D 1000 units and Calcium 1300mg  or 4 servings of dairy a day.  Fasting labs ordered.  Will attempt to get copies of pap.  Restarted Adderall follow up 3 months.   Recheck TSH. Will adjust as needed.   See After Visit Summary for Counseling Recommendations

## 2018-02-08 LAB — COMPLETE METABOLIC PANEL WITH GFR
AG RATIO: 1.5 (calc) (ref 1.0–2.5)
ALBUMIN MSPROF: 4.5 g/dL (ref 3.6–5.1)
ALT: 13 U/L (ref 6–29)
AST: 17 U/L (ref 10–30)
Alkaline phosphatase (APISO): 66 U/L (ref 33–115)
BUN: 16 mg/dL (ref 7–25)
CALCIUM: 9.6 mg/dL (ref 8.6–10.2)
CO2: 23 mmol/L (ref 20–32)
CREATININE: 0.74 mg/dL (ref 0.50–1.10)
Chloride: 105 mmol/L (ref 98–110)
GFR, EST AFRICAN AMERICAN: 125 mL/min/{1.73_m2} (ref >=60)
GFR, EST NON AFRICAN AMERICAN: 108 mL/min/{1.73_m2} (ref >=60)
GLOBULIN: 3.1 g/dL (ref 1.9–3.7)
Glucose, Bld: 96 mg/dL (ref 65–99)
POTASSIUM: 4.3 mmol/L (ref 3.5–5.3)
SODIUM: 138 mmol/L (ref 135–146)
Total Bilirubin: 0.4 mg/dL (ref 0.2–1.2)
Total Protein: 7.6 g/dL (ref 6.1–8.1)

## 2018-02-08 LAB — LIPID PANEL W/REFLEX DIRECT LDL
CHOL/HDL RATIO: 2.8 (calc) (ref <5.0)
CHOLESTEROL: 200 mg/dL — AB (ref <200)
HDL: 71 mg/dL (ref >50)
LDL Cholesterol (Calc): 114 mg/dL (calc) — ABNORMAL HIGH
Non-HDL Cholesterol (Calc): 129 mg/dL (calc) (ref <130)
Triglycerides: 64 mg/dL (ref <150)

## 2018-02-08 LAB — TSH: TSH: 3.06 mIU/L

## 2018-02-10 NOTE — Progress Notes (Signed)
Call pt: total cholesterol went up but because of good cholesterol increasing by 20 points which is a good thing. Keep doing what you are doing.  Thyroid is in normal range at 3.  Kidney, liver, glucose looks great.

## 2018-02-20 HISTORY — PX: HEMORRHOID SURGERY: SHX153

## 2018-08-05 ENCOUNTER — Other Ambulatory Visit: Payer: Self-pay | Admitting: Physician Assistant

## 2018-08-05 ENCOUNTER — Encounter: Payer: Self-pay | Admitting: Physician Assistant

## 2018-08-05 DIAGNOSIS — F900 Attention-deficit hyperactivity disorder, predominantly inattentive type: Secondary | ICD-10-CM

## 2018-08-08 ENCOUNTER — Ambulatory Visit (INDEPENDENT_AMBULATORY_CARE_PROVIDER_SITE_OTHER): Payer: Managed Care, Other (non HMO) | Admitting: Physician Assistant

## 2018-08-08 ENCOUNTER — Encounter: Payer: Self-pay | Admitting: Physician Assistant

## 2018-08-08 DIAGNOSIS — F900 Attention-deficit hyperactivity disorder, predominantly inattentive type: Secondary | ICD-10-CM | POA: Diagnosis not present

## 2018-08-08 MED ORDER — AMPHETAMINE-DEXTROAMPHETAMINE 20 MG PO TABS: 20.0000 mg | ORAL_TABLET | Freq: Two times a day (BID) | ORAL | 0 refills | Status: DC

## 2018-08-08 NOTE — Progress Notes (Deleted)
Patient doing well, just needs refills on Adderall.

## 2018-08-08 NOTE — Progress Notes (Signed)
Patient ID: Jocelyn Taylor, female   DOB: [birth year], 33 y.o.   MRN: [id number] .Marland KitchenVirtual Visit via Video Note  I connected with [name] on 08/08/18 at 11:30 AM EDT by a video enabled telemedicine application and verified that I am speaking with the correct person using two identifiers.  Location: Patient: work Provider: clinic   I discussed the limitations of evaluation and management by telemedicine and the availability of in person appointments. The patient expressed understanding and agreed to proceed.  History of Present Illness: Pt is a 33 yo female with ADHd who calls into the clinic for refills. Pt is doing great. No problems at work. Doing well with sleeping. No increase in anxiety or palpitations.   .. Active Ambulatory Problems    Diagnosis Date Noted  . Multiple thyroid nodules 11/07/2013  . Acquired hypothyroidism 11/11/2013  . Hashimoto's thyroiditis 11/12/2013  . Anxiety 11/21/2013  . Inattention 11/21/2013  . Adult ADHD 06/23/2014  . Sacroiliac joint dysfunction of both sides 10/26/2014  . Idiopathic scoliosis 01/22/2015  . Skin nodule 02/10/2017  . 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 No Resolved Ambulatory Problems   Past Medical History:  Diagnosis Date  . Goiter    Reviewed med, allergy, problem list.     Observations/Objective: No acute distress.  Normal mood.  .. Today's Vitals   08/08/18 1120 08/08/18 1136  BP: (!) 133/91 133/78  Pulse: 91   Weight: 195 lb (88.5 kg)   Height: 5\' 5"  (1.651 m)    Body mass index is 32.45 kg/m.   Assessment and Plan: Marland KitchenMarland KitchenTerrisa was seen today for adhd.  Diagnoses and all orders for this visit:  ADHD (attention deficit hyperactivity disorder), inattentive type -     amphetamine-dextroamphetamine (ADDERALL) 20 MG tablet; Take 1 tablet (20 mg total) by mouth 2 (two) times daily. -      amphetamine-dextroamphetamine (ADDERALL) 20 MG tablet; Take 1 tablet (20 mg total) by mouth 2 (two) times daily. -     amphetamine-dextroamphetamine (ADDERALL) 20 MG tablet; Take 1 tablet (20 mg total) by mouth 2 (two) times daily.   Refilled for 3 months.   Follow Up Instructions:    I discussed the assessment and treatment plan with the patient. The patient was provided an opportunity to ask questions and all were answered. The patient agreed with the plan and demonstrated an understanding of the instructions.   The patient was advised to call back or seek an in-person evaluation if the symptoms worsen or if the condition fails to improve as anticipated.     Iran Planas, PA-C

## 2018-09-01 LAB — HM PAP SMEAR: HM Pap smear: NORMAL

## 2018-09-10 DIAGNOSIS — K649 Unspecified hemorrhoids: Secondary | ICD-10-CM | POA: Insufficient documentation

## 2018-10-29 ENCOUNTER — Other Ambulatory Visit: Payer: Self-pay | Admitting: Physician Assistant

## 2018-10-29 DIAGNOSIS — F900 Attention-deficit hyperactivity disorder, predominantly inattentive type: Secondary | ICD-10-CM

## 2018-10-30 MED ORDER — AMPHETAMINE-DEXTROAMPHETAMINE 20 MG PO TABS: 20.0000 mg | ORAL_TABLET | Freq: Two times a day (BID) | ORAL | 0 refills | Status: DC

## 2019-01-07 ENCOUNTER — Other Ambulatory Visit: Payer: Self-pay | Admitting: Physician Assistant

## 2019-01-07 DIAGNOSIS — F900 Attention-deficit hyperactivity disorder, predominantly inattentive type: Secondary | ICD-10-CM

## 2019-01-07 MED ORDER — AMPHETAMINE-DEXTROAMPHETAMINE 20 MG PO TABS: 20.0000 mg | ORAL_TABLET | Freq: Two times a day (BID) | ORAL | 0 refills | Status: DC

## 2019-01-07 NOTE — Telephone Encounter (Signed)
Last RX sent 10/30/18  Last OV 08/08/18  RX pended

## 2019-03-06 ENCOUNTER — Other Ambulatory Visit: Payer: Self-pay | Admitting: Physician Assistant

## 2019-03-06 DIAGNOSIS — F900 Attention-deficit hyperactivity disorder, predominantly inattentive type: Secondary | ICD-10-CM

## 2019-03-11 ENCOUNTER — Encounter: Payer: Self-pay | Admitting: Physician Assistant

## 2019-03-11 ENCOUNTER — Telehealth (INDEPENDENT_AMBULATORY_CARE_PROVIDER_SITE_OTHER): Payer: Managed Care, Other (non HMO) | Admitting: Physician Assistant

## 2019-03-11 DIAGNOSIS — F900 Attention-deficit hyperactivity disorder, predominantly inattentive type: Secondary | ICD-10-CM | POA: Diagnosis not present

## 2019-03-11 MED ORDER — AMPHETAMINE-DEXTROAMPHETAMINE 20 MG PO TABS: 20.0000 mg | ORAL_TABLET | Freq: Two times a day (BID) | ORAL | 0 refills | Status: DC

## 2019-03-11 NOTE — Progress Notes (Addendum)
Patient ID: Jocelyn Taylor, female   DOB: [birth year], 34 y.o.   MRN: [id number] .Marland KitchenVirtual Visit via Video Note  I connected with Jocelyn Taylor on 03/11/19 at  9:50 AM EST by a video enabled telemedicine application and verified that I am speaking with the correct person using two identifiers.  Location: Patient: home Provider: clinic   I discussed the limitations of evaluation and management by telemedicine and the availability of in person appointments. The patient expressed understanding and agreed to proceed.  History of Present Illness: Pt is a 34 yo female with ADHD who calls into the clinic for medication refill.   Pt is doing well. No problems. No concerns. Sleeping well. No increase in anxiety. No headaches. Doing well at work.     .. Active Ambulatory Problems    Diagnosis Date Noted  . Multiple thyroid nodules 11/07/2013  . Acquired hypothyroidism 11/11/2013  . Hashimoto's thyroiditis 11/12/2013  . Anxiety 11/21/2013  . Inattention 11/21/2013  . Adult ADHD 06/23/2014  . Sacroiliac joint dysfunction of both sides 10/26/2014  . Idiopathic scoliosis 01/22/2015  . Skin nodule 02/10/2017  . 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 No Resolved Ambulatory Problems   Past Medical History:  Diagnosis Date  . Goiter    Reviewed med, allergy, problem list.     Observations/Objective: No acute distress.  Normal mood and appearance.   .. Today's Vitals   03/11/19 0929  Weight: 195 lb (88.5 kg)  Height: 5\' 5"  (1.651 m)   Body mass index is 32.45 kg/m.    Assessment and Plan: Marland KitchenJahnavi was seen today for adhd.  Diagnoses and all orders for this visit:  ADHD (attention deficit hyperactivity disorder), inattentive type -     amphetamine-dextroamphetamine (ADDERALL) 20 MG tablet; Take 1 tablet (20 mg total) by mouth 2 (two) times daily. -      amphetamine-dextroamphetamine (ADDERALL) 20 MG tablet; Take 1 tablet (20 mg total) by mouth 2 (two) times daily. -     amphetamine-dextroamphetamine (ADDERALL) 20 MG tablet; Take 1 tablet (20 mg total) by mouth 2 (two) times daily.   Refilled for 3 months.  Follow up in 6 months.   Spent 7 minutes with patient and in chart review.  Follow Up Instructions:    I discussed the assessment and treatment plan with the patient. The patient was provided an opportunity to ask questions and all were answered. The patient agreed with the plan and demonstrated an understanding of the instructions.   The patient was advised to call back or seek an in-person evaluation if the symptoms worsen or if the condition fails to improve as anticipated.    Darien Ramus, PA-C

## 2019-03-11 NOTE — Progress Notes (Signed)
Needs refills of adderall. No other concerns.

## 2019-03-11 NOTE — Progress Notes (Signed)
Patient ID: Jocelyn Taylor, female   DOB: March 09, 1985, 34 y.o.   MRN: [id number]

## 2019-04-03 ENCOUNTER — Encounter: Payer: Self-pay | Admitting: Physician Assistant

## 2019-04-16 ENCOUNTER — Telehealth: Payer: Managed Care, Other (non HMO) | Admitting: Physician Assistant

## 2019-04-16 DIAGNOSIS — M546 Pain in thoracic spine: Secondary | ICD-10-CM | POA: Diagnosis not present

## 2019-04-16 MED ORDER — CYCLOBENZAPRINE HCL 10 MG PO TABS: 10.0000 mg | ORAL_TABLET | Freq: Two times a day (BID) | ORAL | 0 refills | Status: AC | PRN

## 2019-04-16 MED ORDER — ETODOLAC 500 MG PO TABS: 500.0000 mg | ORAL_TABLET | Freq: Two times a day (BID) | ORAL | 0 refills | Status: AC

## 2019-04-16 NOTE — Progress Notes (Signed)
We are sorry that you are not feeling well.  Here is how we plan to help!  Based on what you have shared with me it looks like you mostly have acute on chronic back pain.  Acute back pain is defined as musculoskeletal pain that can resolve in 1-3 weeks with conservative treatment. Chronic back pain lasts longer than 2-3 months.  I have prescribed Etodolac 300 mg take one by mouth twice a day non-steroid anti-inflammatory (NSAID) as well as Flexeril 10 mg every eight hours as needed which is a muscle relaxer  Some patients experience stomach irritation or in increased heartburn with anti-inflammatory drugs.  Please keep in mind that muscle relaxer's can cause fatigue and should not be taken while at work or driving.  Back pain is very common.  The pain often gets better over time.  The cause of back pain is usually not dangerous.  Most people can learn to manage their back pain on their own.  Home Care  Stay active.  Start with short walks on flat ground if you can.  Try to walk farther each day.  Do not sit, drive or stand in one place for more than 30 minutes.  Do not stay in bed.  Do not avoid exercise or work.  Activity can help your back heal faster.  Be careful when you bend or lift an object.  Bend at your knees, keep the object close to you, and do not twist.  Sleep on a firm mattress.  Lie on your side, and bend your knees.  If you lie on your back, put a pillow under your knees.  Only take medicines as told by your doctor.  Put ice on the injured area.  Put ice in a plastic bag  Place a towel between your skin and the bag  Leave the ice on for 15-20 minutes, 3-4 times a day for the first 2-3 days. 210 After that, you can switch between ice and heat packs.  Ask your doctor about back exercises or massage.  Avoid feeling anxious or stressed.  Find good ways to deal with stress, such as exercise.  Get Help Right Way If:  Your pain does not go away with rest or  medicine.  Your pain does not go away in 1 week.  You have new problems.  You do not feel well.  The pain spreads into your legs.  You cannot control when you poop (bowel movement) or pee (urinate)  You feel sick to your stomach (nauseous) or throw up (vomit)  You have belly (abdominal) pain.  You feel like you may pass out (faint).  If you develop a fever.  Make Sure you:  Understand these instructions.  Will watch your condition  Will get help right away if you are not doing well or get worse.  Your e-visit answers were reviewed by a board certified advanced clinical practitioner to complete your personal care plan.  Depending on the condition, your plan could have included both over the counter or prescription medications.  If there is a problem please reply  once you have received a response from your provider.  Your safety is important to [place].  If you have drug allergies check your prescription carefully.    You can use MyChart to ask questions about today's visit, request a non-urgent call back, or ask for a work or school excuse for 24 hours related to this e-Visit. If it has been greater than 24 hours you will  need to follow up with your provider, or enter a new e-Visit to address those concerns.  You will get an e-mail in the next two days asking about your experience.  I hope that your e-visit has been valuable and will speed your recovery. Thank you for using e-visits.  6 minutes spent on chart

## 2019-05-02 ENCOUNTER — Other Ambulatory Visit: Payer: Self-pay | Admitting: Physician Assistant

## 2019-05-02 DIAGNOSIS — F900 Attention-deficit hyperactivity disorder, predominantly inattentive type: Secondary | ICD-10-CM

## 2019-05-02 MED ORDER — AMPHETAMINE-DEXTROAMPHETAMINE 20 MG PO TABS: 20.0000 mg | ORAL_TABLET | Freq: Two times a day (BID) | ORAL | 0 refills | Status: DC

## 2019-06-27 ENCOUNTER — Other Ambulatory Visit: Payer: Self-pay | Admitting: Physician Assistant

## 2019-06-27 DIAGNOSIS — F900 Attention-deficit hyperactivity disorder, predominantly inattentive type: Secondary | ICD-10-CM

## 2019-07-02 ENCOUNTER — Other Ambulatory Visit: Payer: Self-pay | Admitting: Physician Assistant

## 2019-07-02 DIAGNOSIS — F900 Attention-deficit hyperactivity disorder, predominantly inattentive type: Secondary | ICD-10-CM

## 2019-07-03 NOTE — Telephone Encounter (Signed)
Needs follow up for refill on Adderall. Please call and schedule

## 2019-07-03 NOTE — Telephone Encounter (Signed)
Called PT. Left vm

## 2019-07-07 ENCOUNTER — Other Ambulatory Visit: Payer: Self-pay | Admitting: Neurology

## 2019-07-07 DIAGNOSIS — F900 Attention-deficit hyperactivity disorder, predominantly inattentive type: Secondary | ICD-10-CM

## 2019-07-07 MED ORDER — AMPHETAMINE-DEXTROAMPHETAMINE 20 MG PO TABS: 20.0000 mg | ORAL_TABLET | Freq: Two times a day (BID) | ORAL | 0 refills | Status: DC

## 2019-07-07 NOTE — Telephone Encounter (Signed)
Last filled in March. Last appt January, was told to follow up in 6 months. Please sign.

## 2019-07-16 ENCOUNTER — Encounter: Payer: Self-pay | Admitting: Physician Assistant

## 2019-07-16 NOTE — Telephone Encounter (Signed)
Appointment has been made. No further questions.  °

## 2019-07-17 ENCOUNTER — Ambulatory Visit: Payer: Managed Care, Other (non HMO) | Admitting: Family Medicine

## 2019-09-01 ENCOUNTER — Encounter: Payer: Self-pay | Admitting: Physician Assistant

## 2019-09-01 ENCOUNTER — Telehealth (INDEPENDENT_AMBULATORY_CARE_PROVIDER_SITE_OTHER): Payer: Managed Care, Other (non HMO) | Admitting: Physician Assistant

## 2019-09-01 VITALS — Ht 65.0 in | Wt 195.0 lb

## 2019-09-01 DIAGNOSIS — M546 Pain in thoracic spine: Secondary | ICD-10-CM

## 2019-09-01 DIAGNOSIS — F900 Attention-deficit hyperactivity disorder, predominantly inattentive type: Secondary | ICD-10-CM

## 2019-09-01 DIAGNOSIS — M4127 Other idiopathic scoliosis, lumbosacral region: Secondary | ICD-10-CM | POA: Diagnosis not present

## 2019-09-01 DIAGNOSIS — M545 Low back pain, unspecified: Secondary | ICD-10-CM

## 2019-09-01 DIAGNOSIS — G8929 Other chronic pain: Secondary | ICD-10-CM | POA: Insufficient documentation

## 2019-09-01 MED ORDER — AMPHETAMINE-DEXTROAMPHETAMINE 20 MG PO TABS: 20.0000 mg | ORAL_TABLET | Freq: Two times a day (BID) | ORAL | 0 refills | Status: DC

## 2019-09-01 NOTE — Progress Notes (Signed)
Patient ID: Jocelyn Taylor, female   DOB: [birth year], 34 y.o.   MRN: [id number] .Marland KitchenVirtual Visit via Video Note  I connected with Jocelyn Taylor on 09/01/19 at  8:50 AM EDT by a video enabled telemedicine application and verified that I am speaking with the correct person using two identifiers.  Location: Patient: home Provider: clinic   I discussed the limitations of evaluation and management by telemedicine and the availability of in person appointments. The patient expressed understanding and agreed to proceed.  History of Present Illness: Pt is a 34 yo female with ADHD, hypothyroidism, scolosis and chronic back pain who presents to the clinic for medication refills.   She is doing great with adderall. No problems or concerns. No insomnia or increase in anxiety. She is doing well at work.   She continues to have low back pain. She has had for years but notice more and more. She takes ibuprofen and helps but does not like to take. She is getting regular massages and chiropractic care. It helps some. She feels more and more stiff. She wonders about getting a new mattress.  .. Active Ambulatory Problems    Diagnosis Date Noted  . Multiple thyroid nodules 11/07/2013  . Acquired hypothyroidism 11/11/2013  . Hashimoto's thyroiditis 11/12/2013  . Anxiety 11/21/2013  . Inattention 11/21/2013  . Adult ADHD 06/23/2014  . Sacroiliac joint dysfunction of both sides 10/26/2014  . Idiopathic scoliosis 01/22/2015  . Skin nodule 02/10/2017  . 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 No Resolved Ambulatory Problems   Past Medical History:  Diagnosis Date  . Goiter     Reviewed med, allergy, problem list.   Observations/Objective: No acute distress Normal breathing Normal mood.   .. Today's Vitals   09/01/19 0826  Weight: 195 lb (88.5 kg)  Height: 5\' 5"  (1.651 m)   Body mass index is  32.45 kg/m.    Assessment and Plan: Marland KitchenSonji was seen today for adhd.  Diagnoses and all orders for this visit:  ADHD (attention deficit hyperactivity disorder), inattentive type -     amphetamine-dextroamphetamine (ADDERALL) 20 MG tablet; Take 1 tablet (20 mg total) by mouth 2 (two) times daily. -     amphetamine-dextroamphetamine (ADDERALL) 20 MG tablet; Take 1 tablet (20 mg total) by mouth 2 (two) times daily. -     amphetamine-dextroamphetamine (ADDERALL) 20 MG tablet; Take 1 tablet (20 mg total) by mouth 2 (two) times daily.  Other idiopathic scoliosis, lumbosacral region  Chronic midline thoracic back pain  Chronic midline low back pain without sciatica   Refilled medications for 3 months.  Need CPE and labs.   Discussed low back pain. No red flags. Ibuprofen does help she just does not want to take it. Encouraged to take as needed. Encouraged icy hot/tens unit. Offered sports medicine appt for their opinion as well. Discussed sleeping with something under legs. Discussed good matteress and support.    Follow Up Instructions:    I discussed the assessment and treatment plan with the patient. The patient was provided an opportunity to ask questions and all were answered. The patient agreed with the plan and demonstrated an understanding of the instructions.   The patient was advised to call back or seek an in-person evaluation if the symptoms worsen or if the condition fails to improve as anticipated.  I provided 20 minutes of non-face-to-face time during this encounter.   Darien Ramus,  PA-C

## 2019-09-01 NOTE — Progress Notes (Signed)
Patient needs refills on Adderall. No other issues. PHQ9-GAD7 completed.

## 2019-10-22 ENCOUNTER — Encounter: Payer: Self-pay | Admitting: Physician Assistant

## 2019-11-03 ENCOUNTER — Encounter: Payer: Managed Care, Other (non HMO) | Admitting: Physician Assistant

## 2019-11-04 ENCOUNTER — Encounter: Payer: Managed Care, Other (non HMO) | Admitting: Physician Assistant

## 2019-11-10 ENCOUNTER — Encounter: Payer: Managed Care, Other (non HMO) | Admitting: Physician Assistant

## 2019-11-13 ENCOUNTER — Other Ambulatory Visit: Payer: Self-pay

## 2019-11-13 ENCOUNTER — Ambulatory Visit (INDEPENDENT_AMBULATORY_CARE_PROVIDER_SITE_OTHER): Payer: Managed Care, Other (non HMO) | Admitting: Physician Assistant

## 2019-11-13 ENCOUNTER — Encounter: Payer: Self-pay | Admitting: Physician Assistant

## 2019-11-13 VITALS — BP 125/79 | HR 80 | Ht 65.0 in | Wt 193.0 lb

## 2019-11-13 DIAGNOSIS — Z0000 Encounter for general adult medical examination without abnormal findings: Secondary | ICD-10-CM | POA: Diagnosis not present

## 2019-11-13 DIAGNOSIS — M542 Cervicalgia: Secondary | ICD-10-CM

## 2019-11-13 DIAGNOSIS — E063 Autoimmune thyroiditis: Secondary | ICD-10-CM

## 2019-11-13 DIAGNOSIS — G8929 Other chronic pain: Secondary | ICD-10-CM

## 2019-11-13 DIAGNOSIS — M791 Myalgia, unspecified site: Secondary | ICD-10-CM

## 2019-11-13 DIAGNOSIS — F900 Attention-deficit hyperactivity disorder, predominantly inattentive type: Secondary | ICD-10-CM

## 2019-11-13 DIAGNOSIS — Z13220 Encounter for screening for lipoid disorders: Secondary | ICD-10-CM | POA: Diagnosis not present

## 2019-11-13 DIAGNOSIS — Z131 Encounter for screening for diabetes mellitus: Secondary | ICD-10-CM

## 2019-11-13 DIAGNOSIS — M545 Low back pain: Secondary | ICD-10-CM

## 2019-11-13 MED ORDER — AMPHETAMINE-DEXTROAMPHETAMINE 20 MG PO TABS: 20.0000 mg | ORAL_TABLET | Freq: Two times a day (BID) | ORAL | 0 refills | Status: DC

## 2019-11-13 NOTE — Patient Instructions (Addendum)
Just look up fibromyalgia/myofascial pain.    Health Maintenance, Female Adopting a healthy lifestyle and getting preventive care are important in promoting health and wellness. Ask your health care provider about:  The right schedule for you to have regular tests and exams.  Things you can do on your own to prevent diseases and keep yourself healthy. What should I know about diet, weight, and exercise? Eat a healthy diet   Eat a diet that includes plenty of vegetables, fruits, low-fat dairy products, and lean protein.  Do not eat a lot of foods that are high in solid fats, added sugars, or sodium. Maintain a healthy weight Body mass index (BMI) is used to identify weight problems. It estimates body fat based on height and weight. Your health care provider can help determine your BMI and help you achieve or maintain a healthy weight. Get regular exercise Get regular exercise. This is one of the most important things you can do for your health. Most adults should:  Exercise for at least 150 minutes each week. The exercise should increase your heart rate and make you sweat (moderate-intensity exercise).  Do strengthening exercises at least twice a week. This is in addition to the moderate-intensity exercise.  Spend less time sitting. Even light physical activity can be beneficial. Watch cholesterol and blood lipids Have your blood tested for lipids and cholesterol at 33 years of age, then have this test every 5 years. Have your cholesterol levels checked more often if:  Your lipid or cholesterol levels are high.  You are older than 33 years of age.  You are at high risk for heart disease. What should I know about cancer screening? Depending on your health history and family history, you may need to have cancer screening at various ages. This may include screening for:  Breast cancer.  Cervical cancer.  Colorectal cancer.  Skin cancer.  Lung cancer. What should I know  about heart disease, diabetes, and high blood pressure? Blood pressure and heart disease  High blood pressure causes heart disease and increases the risk of stroke. This is more likely to develop in people who have high blood pressure readings, are of African descent, or are overweight.  Have your blood pressure checked: ? Every 3-5 years if you are 40-51 years of age. ? Every year if you are 56 years old or older. Diabetes Have regular diabetes screenings. This checks your fasting blood sugar level. Have the screening done:  Once every three years after age 26 if you are at a normal weight and have a low risk for diabetes.  More often and at a younger age if you are overweight or have a high risk for diabetes. What should I know about preventing infection? Hepatitis B If you have a higher risk for hepatitis B, you should be screened for this virus. Talk with your health care provider to find out if you are at risk for hepatitis B infection. Hepatitis C Testing is recommended for:  Everyone born from [birth year] through 1964.  Anyone with known risk factors for hepatitis C. Sexually transmitted infections (STIs)  Get screened for STIs, including gonorrhea and chlamydia, if: ? You are sexually active and are younger than 33 years of age. ? You are older than 33 years of age and your health care provider tells you that you are at risk for this type of infection. ? Your sexual activity has changed since you were last screened, and you are at increased risk for chlamydia  or gonorrhea. Ask your health care provider if you are at risk.  Ask your health care provider about whether you are at high risk for HIV. Your health care provider may recommend a prescription medicine to help prevent HIV infection. If you choose to take medicine to prevent HIV, you should first get tested for HIV. You should then be tested every 3 months for as long as you are taking the medicine. Pregnancy  If you are about  to stop having your period (premenopausal) and you may become pregnant, seek counseling before you get pregnant.  Take 400 to 800 micrograms (mcg) of folic acid every day if you become pregnant.  Ask for birth control (contraception) if you want to prevent pregnancy. Osteoporosis and menopause Osteoporosis is a disease in which the bones lose minerals and strength with aging. This can result in bone fractures. If you are 47 years old or older, or if you are at risk for osteoporosis and fractures, ask your health care provider if you should:  Be screened for bone loss.  Take a calcium or vitamin D supplement to lower your risk of fractures.  Be given hormone replacement therapy (HRT) to treat symptoms of menopause. Follow these instructions at home: Lifestyle  Do not use any products that contain nicotine or tobacco, such as cigarettes, e-cigarettes, and chewing tobacco. If you need help quitting, ask your health care provider.  Do not use street drugs.  Do not share needles.  Ask your health care provider for help if you need support or information about quitting drugs. Alcohol use  Do not drink alcohol if: ? Your health care provider tells you not to drink. ? You are pregnant, may be pregnant, or are planning to become pregnant.  If you drink alcohol: ? Limit how much you use to 0-1 drink a day. ? Limit intake if you are breastfeeding.  Be aware of how much alcohol is in your drink. In the [place] one drink equals one 12 oz bottle of beer (355 mL), one 5 oz glass of wine (148 mL), or one 1 oz glass of hard liquor (44 mL). General instructions  Schedule regular health, dental, and eye exams.  Stay current with your vaccines.  Tell your health care provider if: ? You often feel depressed. ? You have ever been abused or do not feel safe at home. Summary  Adopting a healthy lifestyle and getting preventive care are important in promoting health and wellness.  Follow your  health care provider's instructions about healthy diet, exercising, and getting tested or screened for diseases.  Follow your health care provider's instructions on monitoring your cholesterol and blood pressure. This information is not intended to replace advice given to you by your health care provider. Make sure you discuss any questions you have with your health care provider. Document Revised: 01/30/2018 Document Reviewed: 01/30/2018 Elsevier Patient Education  2020 [employer].

## 2019-11-13 NOTE — Progress Notes (Addendum)
Subjective:    Patient ID: Jocelyn Taylor, female    DOB: April 11, 1985, 34 y.o.   MRN: [id number]  HPI  Patient is a 34 year old female with chronic upper, mid, low back pain, 84, Hashimoto's thyroiditis who presents to the clinic for refills and routine physical.  She is doing well on Adderall.  She wished to stay on same dose.  She denies any problems sleeping or with increased anxiety, headaches, palpitations.  Patient continues to have ongoing back and muscle pain.  Last x-ray was 5 years ago.  She does admit to a lot of morning stiffness.  Stretching seems to help some.  Chiropractic seem to help some.  Ibuprofen seems to help some.  She is not consistent with anything and does not like to do or take medications daily.  Patient denies any radiation of pain into the legs, saddle anesthesia, bowel or bladder dysfunction, leg weakness, upper extremity weakness.  She did have a episode of diarrhea and nausea with eating for 2 days last week.  Denies any fever.  Denies any abdominal pain, loss of smell or taste, melena, hematochezia.  All symptoms have resolved.  No one else in the house had any symptoms.  .. Active Ambulatory Problems    Diagnosis Date Noted  . Multiple thyroid nodules 11/07/2013  . Acquired hypothyroidism 11/11/2013  . Hashimoto's thyroiditis 11/12/2013  . Anxiety 11/21/2013  . Inattention 11/21/2013  . Adult ADHD 06/23/2014  . Sacroiliac joint dysfunction of both sides 10/26/2014  . Idiopathic scoliosis 01/22/2015  . Skin nodule 02/10/2017  . 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 Chronic midline thoracic back pain 09/01/2019  . Chronic bilateral low back pain without sciatica 09/01/2019  . Chronic neck pain 11/17/2019  . Myalgia 11/17/2019   Resolved Ambulatory Problems    Diagnosis Date Noted  . No Resolved Ambulatory Problems   Past Medical History:  Diagnosis Date  . Goiter    .[name] Family History  Problem  Relation Age of Onset  . Cancer Mother        ovarian   .[name] Social History   Socioeconomic History  . Marital status: Married    Spouse name: Not on file  . Number of children: Not on file  . Years of education: Not on file  . Highest education level: Not on file  Occupational History  . Not on file  Tobacco Use  . Smoking status: Never Smoker  . Smokeless tobacco: Never Used  Substance and Sexual Activity  . Alcohol use: Yes  . Drug use: No  . Sexual activity: Yes  Other Topics Concern  . Not on file  Social History Narrative  . Not on file   Social Determinants of Health   Financial Resource Strain:   . Difficulty of Paying Living Expenses: Not on file  Food Insecurity:   . Worried About Programme researcher, broadcasting/film/video in the Last Year: Not on file  . Ran Out of Food in the Last Year: Not on file  Transportation Needs:   . Lack of Transportation (Medical): Not on file  . Lack of Transportation (Non-Medical): Not on file  Physical Activity:   . Days of Exercise per Week: Not on file  . Minutes of Exercise per Session: Not on file  Stress:   . Feeling of Stress : Not on file  Social Connections:   . Frequency of Communication with Friends and Family: Not on file  . Frequency of Social Gatherings with  Friends and Family: Not on file  . Attends Religious Services: Not on file  . Active Member of Clubs or Organizations: Not on file  . Attends Banker Meetings: Not on file  . Marital Status: Not on file  Intimate Partner Violence:   . Fear of Current or Ex-Partner: Not on file  . Emotionally Abused: Not on file  . Physically Abused: Not on file  . Sexually Abused: Not on file     Review of Systems  Constitutional: Negative for chills and fatigue.       Objective:   Physical Exam BP 125/79   Pulse 80   Ht 5\' 5"  (1.651 m)   Wt 193 lb (87.5 kg)   SpO2 100%   BMI 32.12 kg/m   General Appearance:    Alert, cooperative, no distress, appears stated age   Head:    Normocephalic, without obvious abnormality, atraumatic  Eyes:    PERRL, conjunctiva/corneas clear, EOM's intact, fundi    benign, both eyes  Ears:    Normal TM's and external ear canals, both ears  Nose:   Nares normal, septum midline, mucosa normal, no drainage    or sinus tenderness  Throat:   Lips, mucosa, and tongue normal; teeth and gums normal  Neck:   Supple, symmetrical, trachea midline, no adenopathy;    thyroid:  no enlargement/tenderness/nodules; no carotid   bruit or JVD  Back:     Symmetric, no curvature, ROM normal, no CVA tenderness  Lungs:     Clear to auscultation bilaterally, respirations unlabored  Chest Wall:    No tenderness or deformity   Heart:    Regular rate and rhythm, S1 and S2 normal, no murmur, rub   or gallop     Abdomen:     Soft, non-tender, bowel sounds active all four quadrants,    no masses, no organomegaly        Extremities:   Extremities normal, atraumatic, no cyanosis or edema. Numerous tender points to palpation over muscles bilaterally.   Pulses:   2+ and symmetric all extremities  Skin:   Skin color, texture, turgor normal, no rashes or lesions  Lymph nodes:   Cervical, supraclavicular, and axillary nodes normal  Neurologic:   CNII-XII intact, normal strength, sensation and reflexes    throughout    . Depression screen [facility] 2/9 11/13/2019 09/01/2019 12/23/2017  Decreased Interest 0 0 0  Down, Depressed, Hopeless 0 0 0  PHQ - 2 Score 0 0 0  Altered sleeping 1 0 -  Tired, decreased energy 0 0 -  Change in appetite 0 0 -  Feeling bad or failure about yourself  0 0 -  Trouble concentrating 1 0 -  Moving slowly or fidgety/restless 0 0 -  Suicidal thoughts 0 0 -  PHQ-9 Score 2 0 -  Difficult doing work/chores Not difficult at all Not difficult at all -   .. GAD 7 : Generalized Anxiety Score 11/13/2019 09/01/2019  Nervous, Anxious, on Edge 1 0  Control/stop worrying 0 1  Worry too much - different things 0 1  Trouble relaxing 0 1   Restless 0 0  Easily annoyed or irritable 0 0  Afraid - awful might happen 0 0  Total GAD 7 Score 1 3  Anxiety Difficulty Not difficult at all Not difficult at all         Assessment & Plan:  9/12/2021Marland KitchenNike was seen today for annual exam.  Diagnoses and all orders for this  visit:  Routine physical examination -     DG Lumbar Spine Complete -     DG Cervical Spine Complete -     Lipid Panel w/reflex Direct LDL -     COMPLETE METABOLIC PANEL WITH GFR -     TSH  Chronic neck pain  Chronic bilateral low back pain without sciatica  Screening for lipid disorders -     Lipid Panel w/reflex Direct LDL  Screening for diabetes mellitus -     COMPLETE METABOLIC PANEL WITH GFR  Hashimoto's thyroiditis -     TSH  ADHD (attention deficit hyperactivity disorder), inattentive type -     amphetamine-dextroamphetamine (ADDERALL) 20 MG tablet; Take 1 tablet (20 mg total) by mouth 2 (two) times daily. -     amphetamine-dextroamphetamine (ADDERALL) 20 MG tablet; Take 1 tablet (20 mg total) by mouth 2 (two) times daily. -     amphetamine-dextroamphetamine (ADDERALL) 20 MG tablet; Take 1 tablet (20 mg total) by mouth 2 (two) times daily.  Myalgia   .[name] Discussed 150 minutes of exercise a week.  Encouraged vitamin D 1000 units and Calcium 1300mg  or 4 servings of dairy a day.  Mood scores good. Fasting labs ordered today. Pap UTD.  Pt declined flu and covid vaccine. Pt aware of risk.   Refilled adderall for 3 months.   Discussed ongoing chronic musculoskeletal pain.  Ordered x-ray since they have not been done in 5 years.  Suggested a follow-up appointment to go over myofascial/fibromyalgia signs and symptoms and tender points.  Discussed low sugar and plant-based diet.  Regular exercise can really help with fibromyalgia.  I am not diagnosing her with that now but I would like her to look into it. No one test will tell this but not finding other causes to pain and potentially treating as  myofascial pain and if benefits could help [place] get diagnoses.   [place] PA-C, have reviewed and agree with the above documentation in it's entirety.

## 2019-11-14 ENCOUNTER — Encounter: Payer: Self-pay | Admitting: Neurology

## 2019-11-14 LAB — COMPLETE METABOLIC PANEL WITH GFR
AG Ratio: 1.4 (calc) (ref 1.0–2.5)
ALT: 26 U/L (ref 6–29)
AST: 22 U/L (ref 10–30)
Albumin: 4.2 g/dL (ref 3.6–5.1)
Alkaline phosphatase (APISO): 58 U/L (ref 31–125)
BUN: 7 mg/dL (ref 7–25)
CO2: 27 mmol/L (ref 20–32)
Calcium: 9.4 mg/dL (ref 8.6–10.2)
Chloride: 103 mmol/L (ref 98–110)
Creat: 0.61 mg/dL (ref 0.50–1.10)
GFR, Est African American: 138 mL/min/{1.73_m2} (ref >=60)
GFR, Est Non African American: 119 mL/min/{1.73_m2} (ref >=60)
Globulin: 2.9 g/dL (calc) (ref 1.9–3.7)
Glucose, Bld: 87 mg/dL (ref 65–99)
Potassium: 4.3 mmol/L (ref 3.5–5.3)
Sodium: 137 mmol/L (ref 135–146)
Total Bilirubin: 0.3 mg/dL (ref 0.2–1.2)
Total Protein: 7.1 g/dL (ref 6.1–8.1)

## 2019-11-14 LAB — LIPID PANEL W/REFLEX DIRECT LDL
Cholesterol: 155 mg/dL (ref <200)
HDL: 50 mg/dL (ref >=50)
LDL Cholesterol (Calc): 89 mg/dL (calc)
Non-HDL Cholesterol (Calc): 105 mg/dL (calc) (ref <130)
Total CHOL/HDL Ratio: 3.1 (calc) (ref <5.0)
Triglycerides: 73 mg/dL (ref <150)

## 2019-11-14 LAB — TSH: TSH: 4.31 mIU/L

## 2019-11-14 NOTE — Progress Notes (Signed)
Jocelyn Taylor,   Cholesterol looks much better! Thyroid normal range but on the hypothyroid side. It could make you feel better to get TSH to the 1-2 level.

## 2019-11-17 DIAGNOSIS — M791 Myalgia, unspecified site: Secondary | ICD-10-CM | POA: Insufficient documentation

## 2019-11-17 DIAGNOSIS — G8929 Other chronic pain: Secondary | ICD-10-CM | POA: Insufficient documentation

## 2019-11-18 ENCOUNTER — Encounter: Payer: Self-pay | Admitting: Physician Assistant

## 2020-02-23 ENCOUNTER — Encounter: Payer: Self-pay | Admitting: Physician Assistant

## 2020-02-23 ENCOUNTER — Telehealth (INDEPENDENT_AMBULATORY_CARE_PROVIDER_SITE_OTHER): Payer: Managed Care, Other (non HMO) | Admitting: Physician Assistant

## 2020-02-23 DIAGNOSIS — M62838 Other muscle spasm: Secondary | ICD-10-CM

## 2020-02-23 DIAGNOSIS — M542 Cervicalgia: Secondary | ICD-10-CM

## 2020-02-23 MED ORDER — ORPHENADRINE CITRATE ER 100 MG PO TB12: 100.0000 mg | ORAL_TABLET | Freq: Two times a day (BID) | ORAL | 0 refills | Status: DC | PRN

## 2020-02-23 MED ORDER — IBUPROFEN 800 MG PO TABS: 800.0000 mg | ORAL_TABLET | Freq: Three times a day (TID) | ORAL | 0 refills | Status: DC | PRN

## 2020-02-23 MED ORDER — TRAMADOL HCL 50 MG PO TABS: 50.0000 mg | ORAL_TABLET | Freq: Four times a day (QID) | ORAL | 0 refills | Status: AC | PRN

## 2020-02-23 NOTE — Progress Notes (Signed)
..Virtual Visit via Video Note  I connected with Jocelyn Taylor on 02/23/20 at  9:50 AM EST by a video enabled telemedicine application and verified that I am speaking with the correct person using two identifiers.  Location: Patient: home Provider: home  .Marland KitchenParticipating in visit:  Patient: Jocelyn Taylor Provider: Tandy Gaw PA-C   I discussed the limitations of evaluation and management by telemedicine and the availability of in person appointments. The patient expressed understanding and agreed to proceed.  History of Present Illness: Pt is a 35 yo female with hx of chronic low back pain who calls into the clinic with upper neck pain since yesterday morning. She woke up with the neck pain. No known injury. She has had this happen before but not this painful. She would rate it 8/10. She denies any radiation of pain into hands or numbness or tingling. She has tried biofreeze, heat, massage with little benefit.   .. Active Ambulatory Problems    Diagnosis Date Noted  . Multiple thyroid nodules 11/07/2013  . Acquired hypothyroidism 11/11/2013  . Hashimoto's thyroiditis 11/12/2013  . Anxiety 11/21/2013  . Inattention 11/21/2013  . Adult ADHD 06/23/2014  . Sacroiliac joint dysfunction of both sides 10/26/2014  . Idiopathic scoliosis 01/22/2015  . Skin nodule 02/10/2017  . 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 Chronic midline thoracic back pain 09/01/2019  . Chronic bilateral low back pain without sciatica 09/01/2019  . Chronic neck pain 11/17/2019  . Myalgia 11/17/2019   Resolved Ambulatory Problems    Diagnosis Date Noted  . No Resolved Ambulatory Problems   Past Medical History:  Diagnosis Date  . Goiter    Reviewed med, allergy, problem list.     Observations/Objective: No acute distress Normal breathing ROM to left decreased due to pain.  NROM of left shoulder  .Marland KitchenThere were no vitals filed for this visit. There is  no height or weight on file to calculate BMI.     Assessment and Plan:  Marland KitchenMarland KitchenDiagnoses and all orders for this visit:  Acute neck pain -     ibuprofen (ADVIL) 800 MG tablet; Take 1 tablet (800 mg total) by mouth every 8 (eight) hours as needed. -     orphenadrine (NORFLEX) 100 MG tablet; Take 1 tablet (100 mg total) by mouth 2 (two) times daily as needed for muscle spasms. -     traMADol (ULTRAM) 50 MG tablet; Take 1 tablet (50 mg total) by mouth every 6 (six) hours as needed for up to 5 days.  Muscle spasms of neck -     ibuprofen (ADVIL) 800 MG tablet; Take 1 tablet (800 mg total) by mouth every 8 (eight) hours as needed. -     orphenadrine (NORFLEX) 100 MG tablet; Take 1 tablet (100 mg total) by mouth 2 (two) times daily as needed for muscle spasms. -     traMADol (ULTRAM) 50 MG tablet; Take 1 tablet (50 mg total) by mouth every 6 (six) hours as needed for up to 5 days.   Start with ibuprofen 800mg  up to three times a day, norflex, tramadol for break through pain. Pt has appt with chiropractor this Wednesday. Consider icy hot patches, tens unit, neck stretches. No red flag signs or symptoms today.    Thursday.PDMP reviewed during this encounter.  No concerns.     Follow Up Instructions:    I discussed the assessment and treatment plan with the patient. The patient was provided an opportunity to ask questions  and all were answered. The patient agreed with the plan and demonstrated an understanding of the instructions.   The patient was advised to call back or seek an in-person evaluation if the symptoms worsen or if the condition fails to improve as anticipated.   Tandy Gaw, PA-C

## 2020-03-04 ENCOUNTER — Other Ambulatory Visit: Payer: Self-pay | Admitting: General Practice

## 2020-03-04 ENCOUNTER — Encounter: Payer: Self-pay | Admitting: Physician Assistant

## 2020-03-04 DIAGNOSIS — M542 Cervicalgia: Secondary | ICD-10-CM

## 2020-03-04 DIAGNOSIS — M51369 Other intervertebral disc degeneration, lumbar region without mention of lumbar back pain or lower extremity pain: Secondary | ICD-10-CM

## 2020-03-04 DIAGNOSIS — M5134 Other intervertebral disc degeneration, thoracic region: Secondary | ICD-10-CM

## 2020-03-04 DIAGNOSIS — M5136 Other intervertebral disc degeneration, lumbar region: Secondary | ICD-10-CM

## 2020-03-05 ENCOUNTER — Ambulatory Visit (INDEPENDENT_AMBULATORY_CARE_PROVIDER_SITE_OTHER): Payer: Managed Care, Other (non HMO)

## 2020-03-05 ENCOUNTER — Other Ambulatory Visit: Payer: Self-pay

## 2020-03-05 DIAGNOSIS — M5134 Other intervertebral disc degeneration, thoracic region: Secondary | ICD-10-CM

## 2020-03-05 DIAGNOSIS — M542 Cervicalgia: Secondary | ICD-10-CM

## 2020-03-05 DIAGNOSIS — M5136 Other intervertebral disc degeneration, lumbar region: Secondary | ICD-10-CM

## 2020-03-05 DIAGNOSIS — M4134 Thoracogenic scoliosis, thoracic region: Secondary | ICD-10-CM | POA: Diagnosis not present

## 2020-03-05 IMAGING — DX DG THORACIC SPINE 3V
3 series · 3 of 3 positions shown · non-contrast
Comparison: None.

CLINICAL DATA: Thoracic scoliosis.

EXAM:
THORACIC SPINE - 3 VIEWS

[t-spine ap]
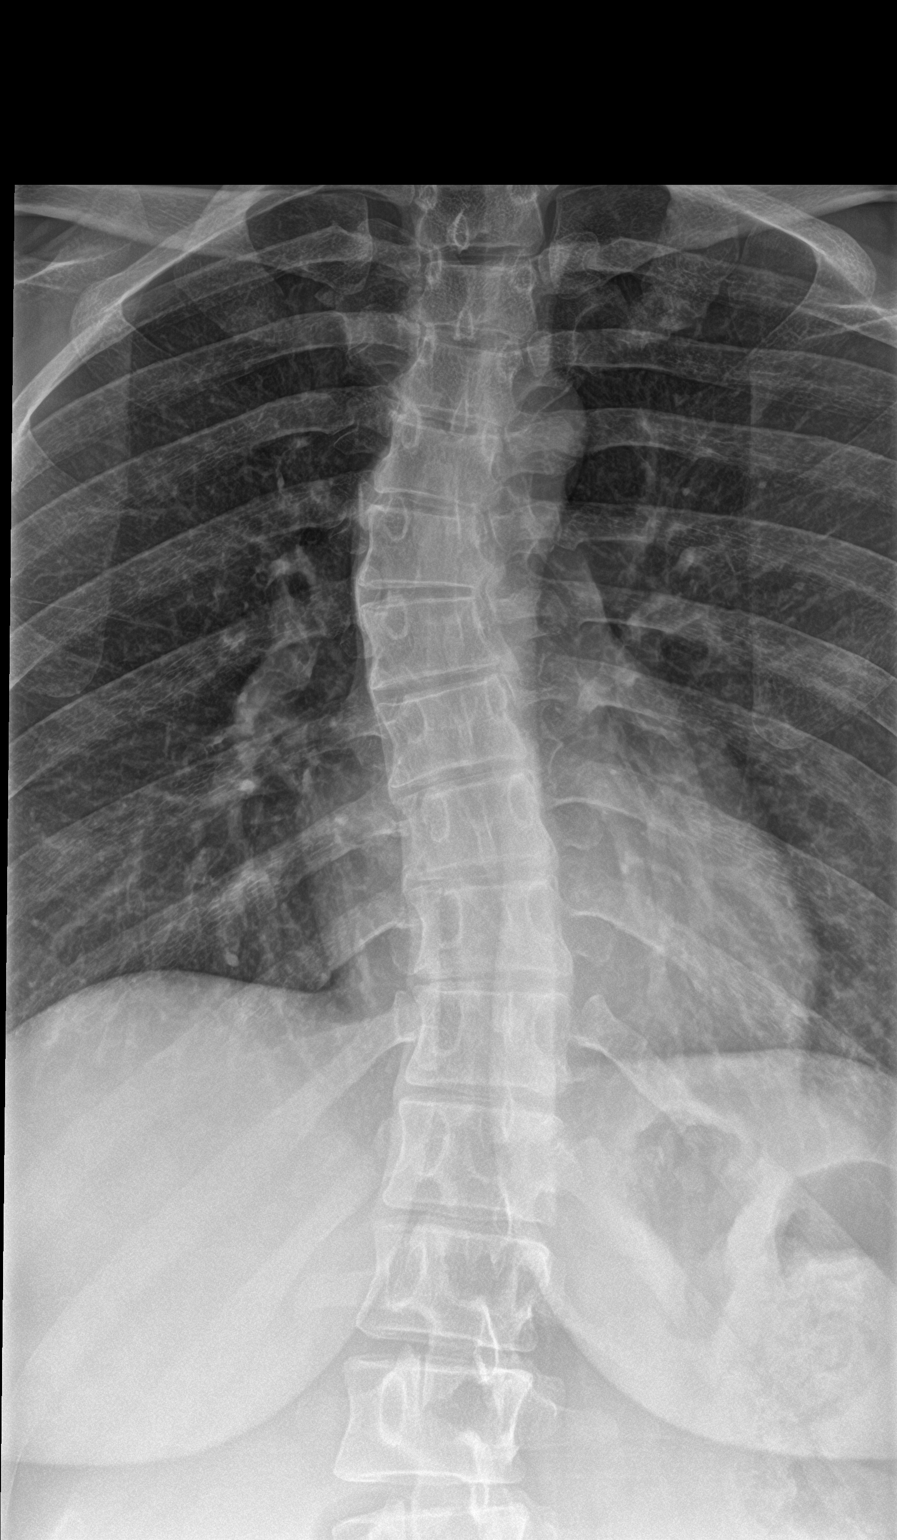

[t-spine lat]
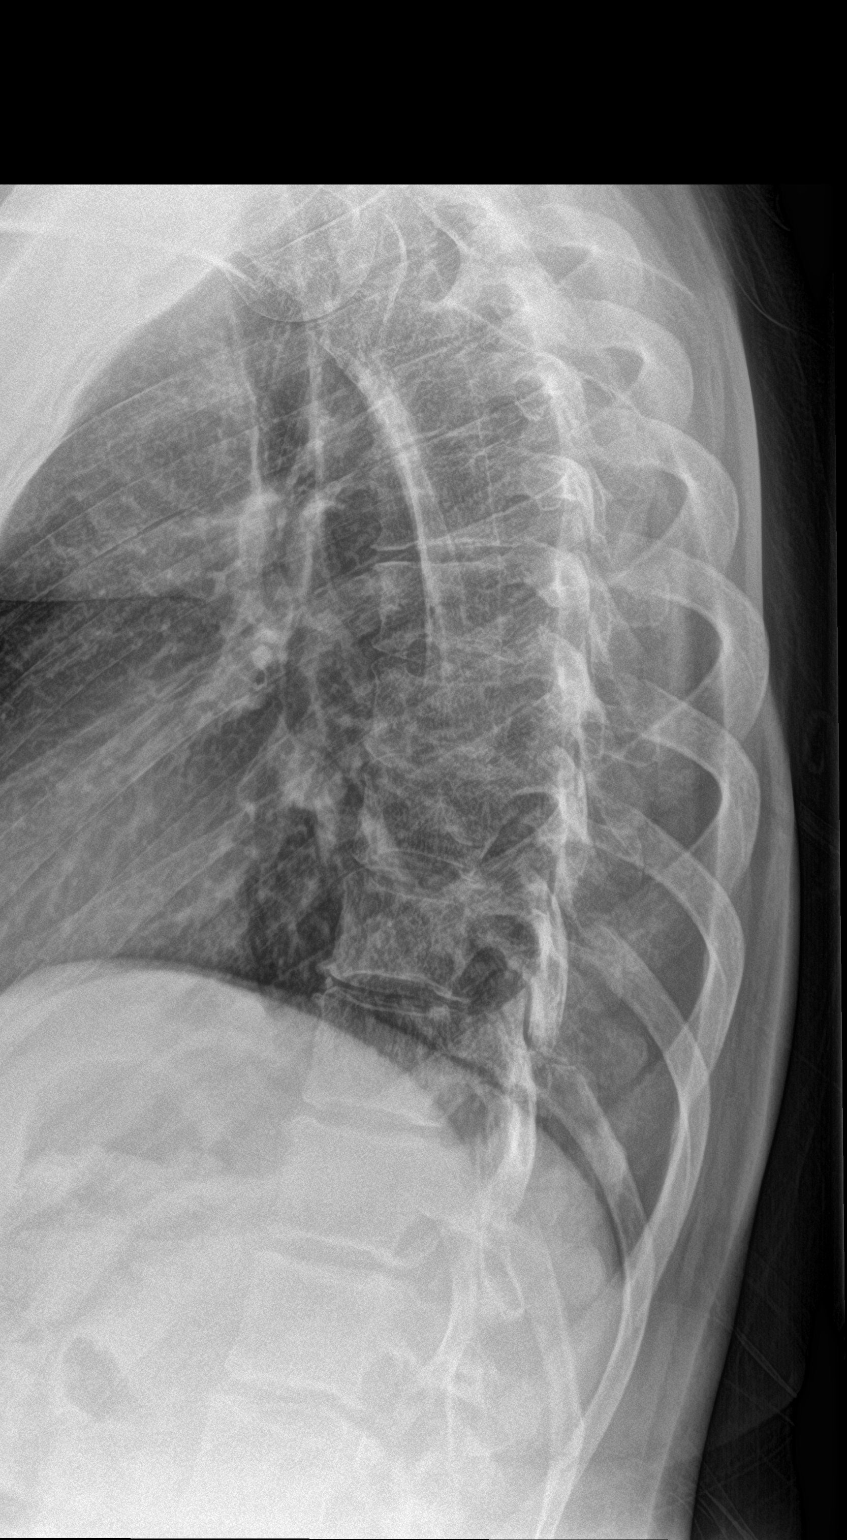

[c-spine swimmers]
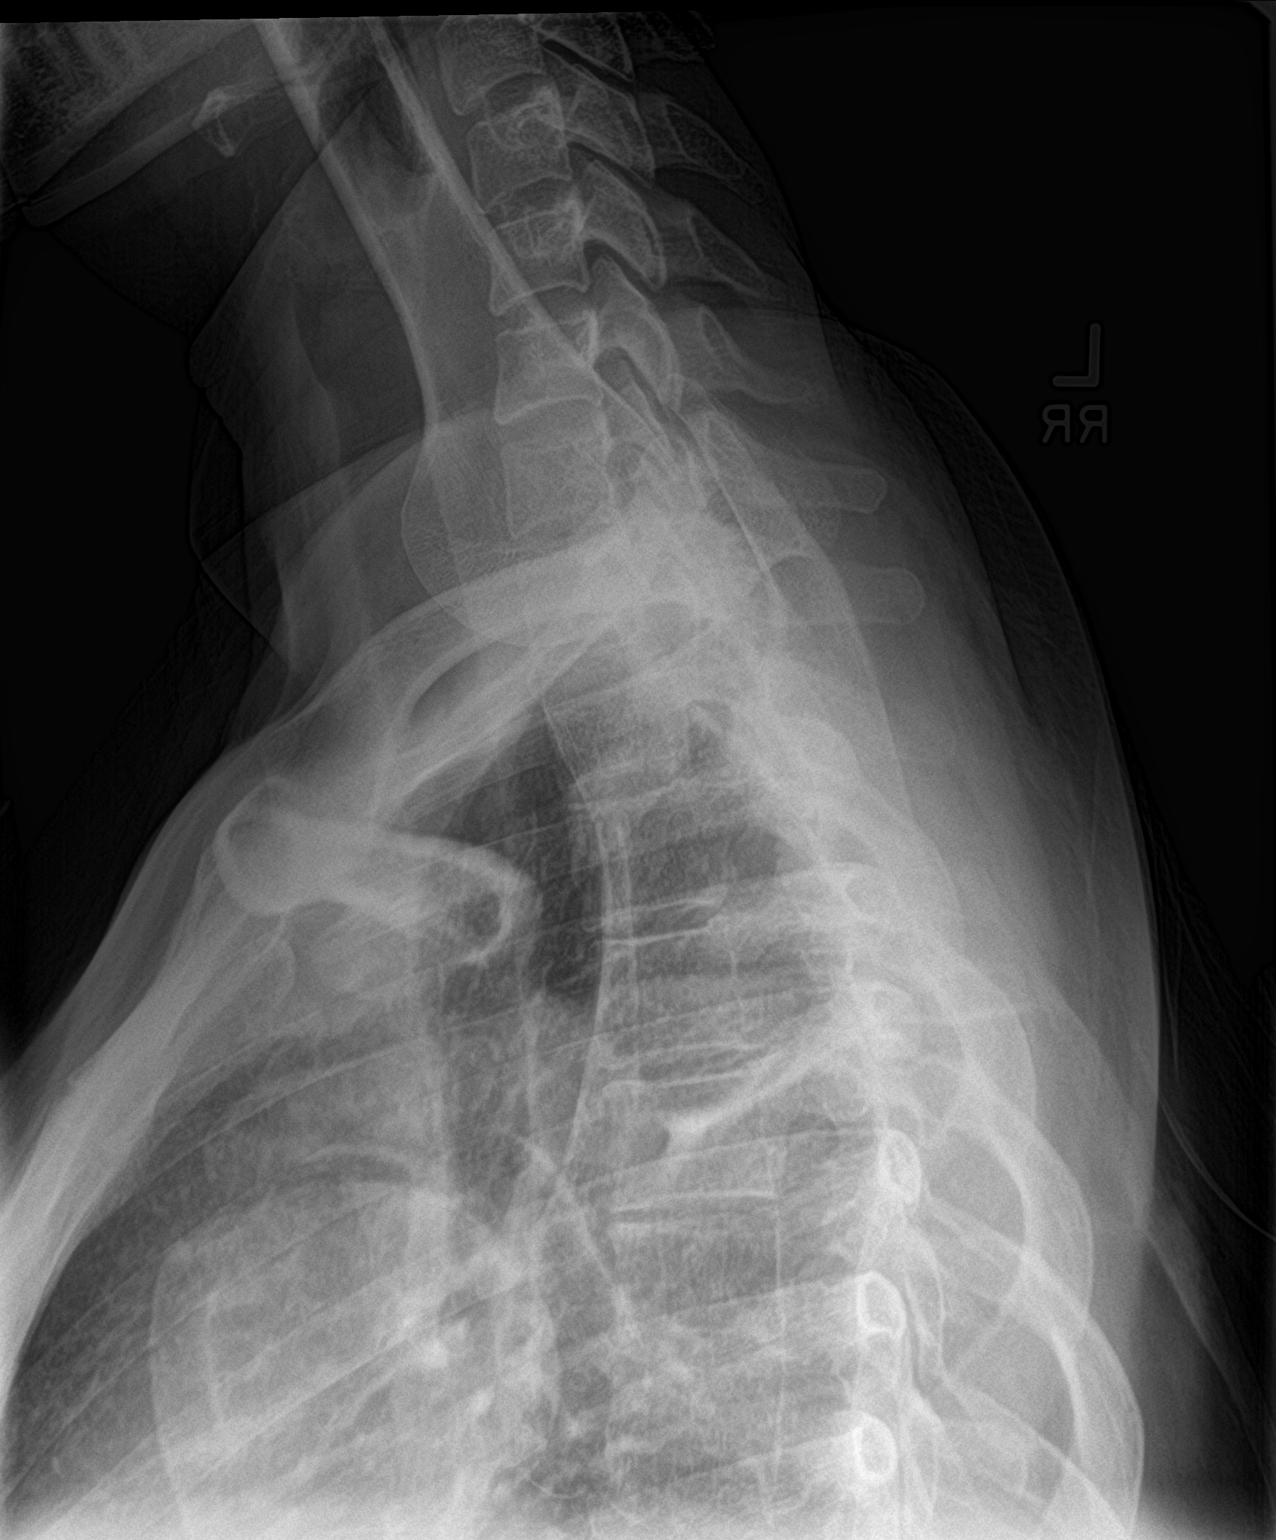

[3 of 3 positions shown; findings below may reference images not displayed]

FINDINGS: Full-length PA radiograph of the spine is provided.

18 degrees of dextroscoliosis of the thoracic spine from the
superior endplate of T4 to the inferior endplate of T9. 20 degrees
of levoscoliosis from the superior endplate of T9 to the inferior
endplate of L1. No intrinsic vertebral anomalies.

Vertebral body heights are maintained. No acute fracture. No static
listhesis.

Disc spaces are maintained.

Visualized portions of the lungs are clear.

No significant pelvic tilt. Soft tissues are unremarkable.
IMPRESSION: S-shaped scoliosis of the thoracic spine.

## 2020-03-05 IMAGING — DX DG LUMBAR SPINE COMPLETE 4+V
5 series · 5 of 5 positions shown · non-contrast
Comparison: None.

CLINICAL DATA: History of scoliosis.  Degenerative disc disease.

EXAM:
LUMBAR SPINE - COMPLETE 4+ VIEW

[l-spine ap]
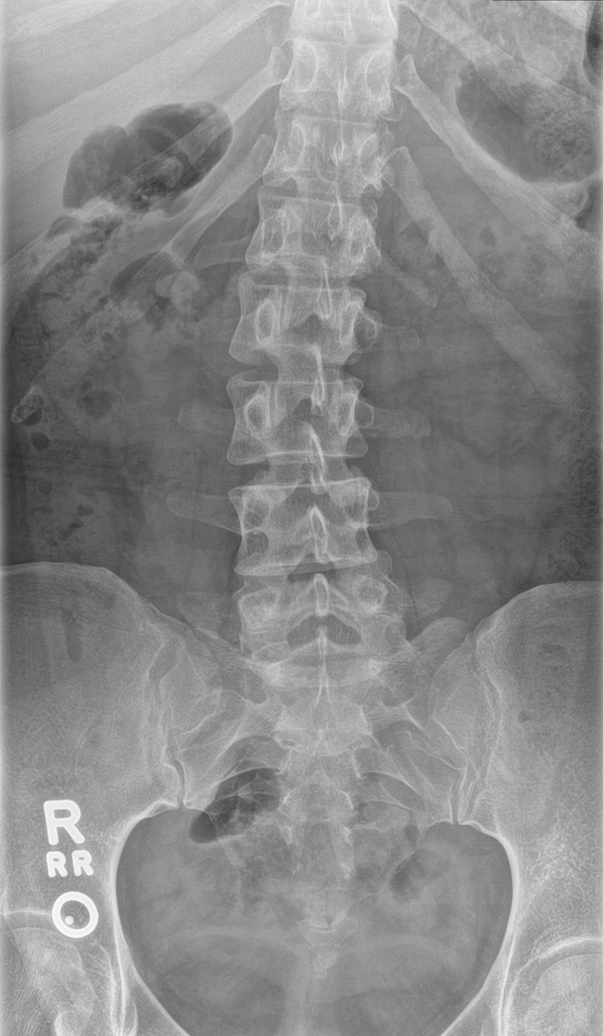

[l-spine obl (1 of 2)]
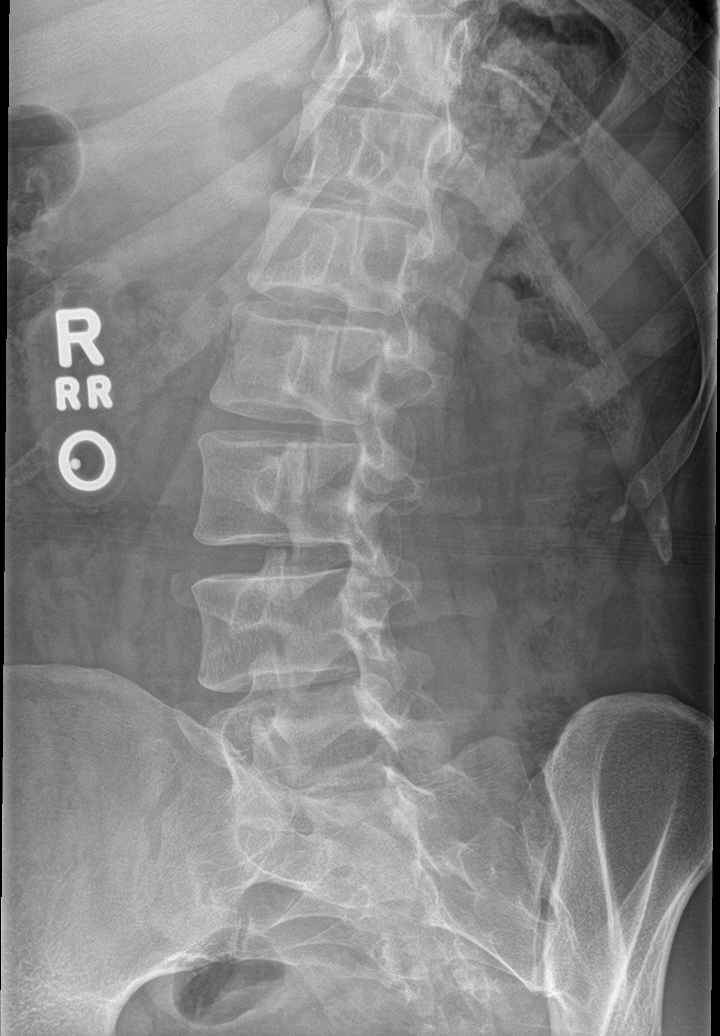

[l-spine obl (2 of 2)]
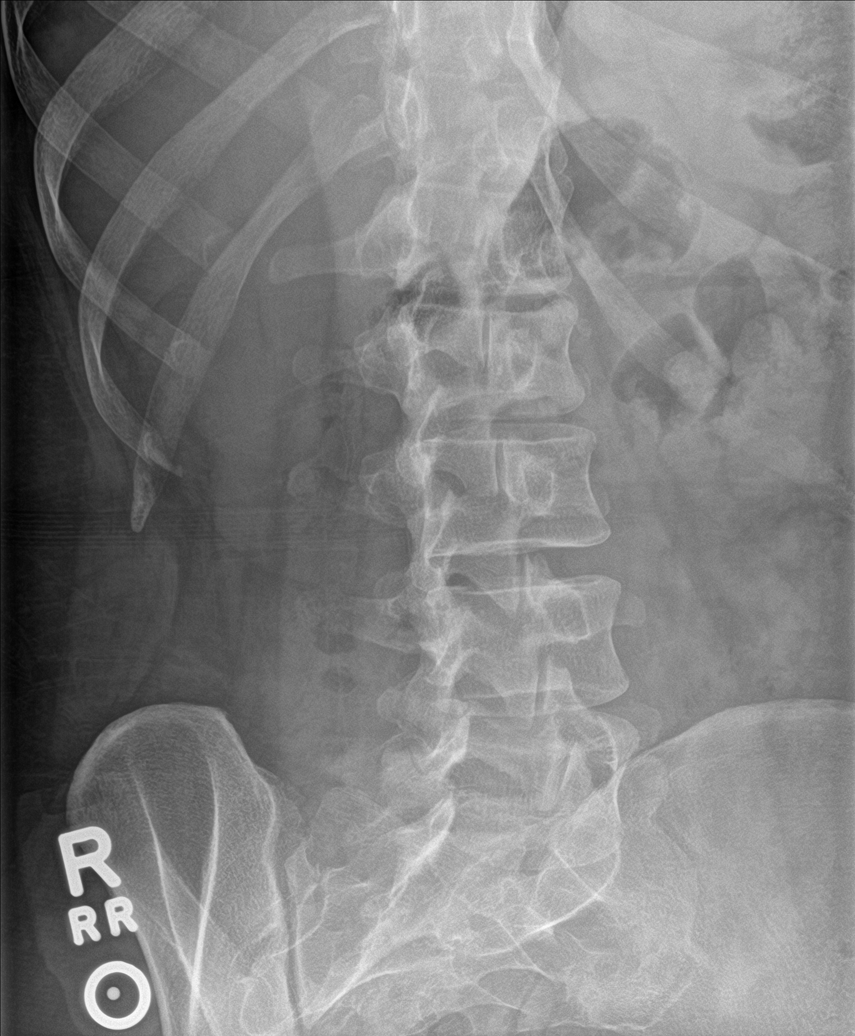

[l-spine lat]
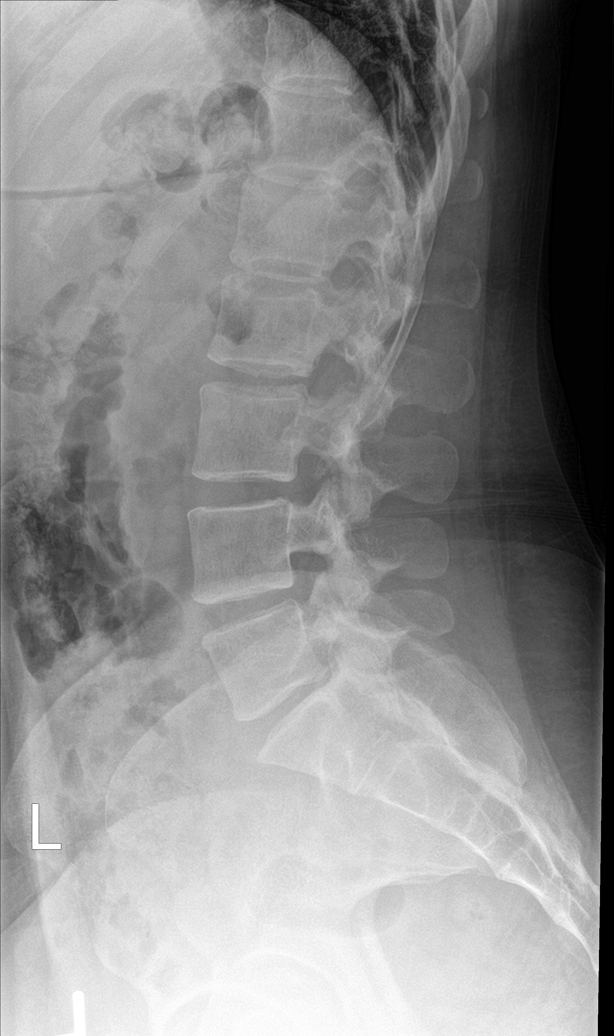

[l-spine spot]
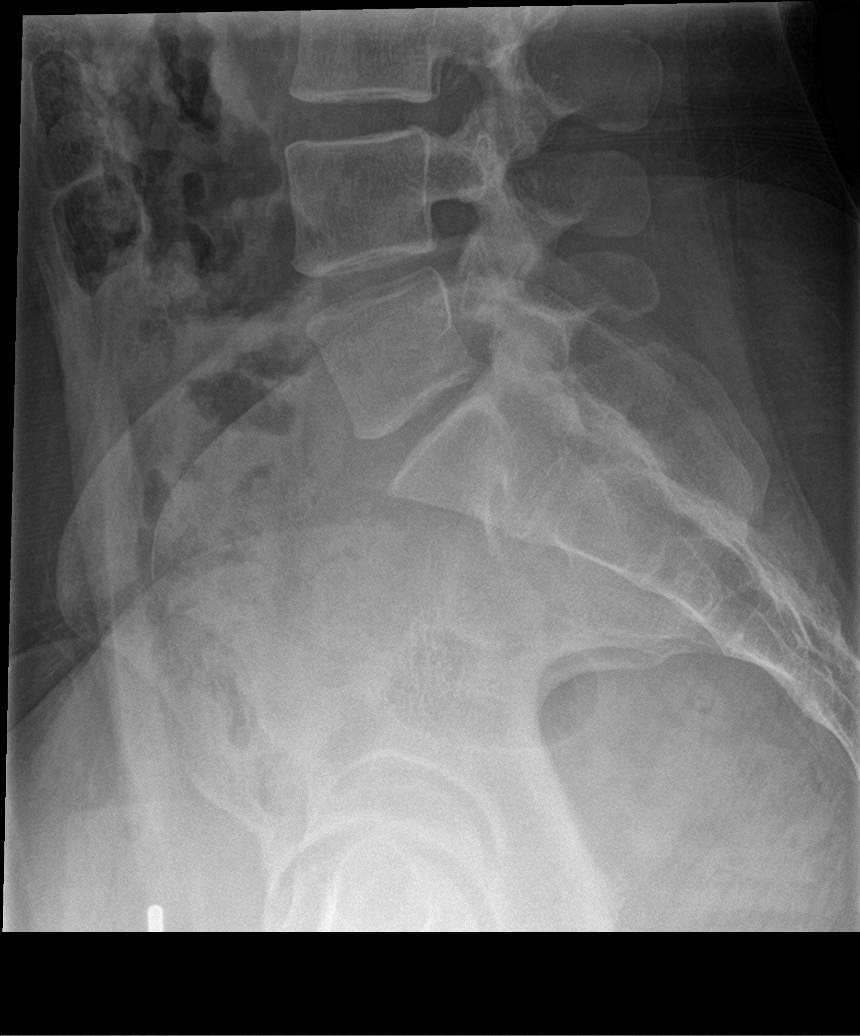

[5 of 5 positions shown; findings below may reference images not displayed]

FINDINGS: 5 nonrib bearing lumbar-type vertebral bodies.

Vertebral body heights are maintained. No acute fracture.

14 degrees of dextroscoliosis from the superior endplate of T12 to
the inferior endplate of L4.

No static listhesis. No spondylolysis.

Degenerative disease with disc height loss at L4-5 and L5-S1.

SI joints are unremarkable.
IMPRESSION: No acute osseous injury of the lumbar spine.

## 2020-03-05 IMAGING — DX DG CERVICAL SPINE COMPLETE 4+V
7 series · 7 of 7 positions shown · non-contrast
Comparison: None.

CLINICAL DATA: Cervical pain

EXAM:
CERVICAL SPINE - COMPLETE 4+ VIEW

[c-spine lat]
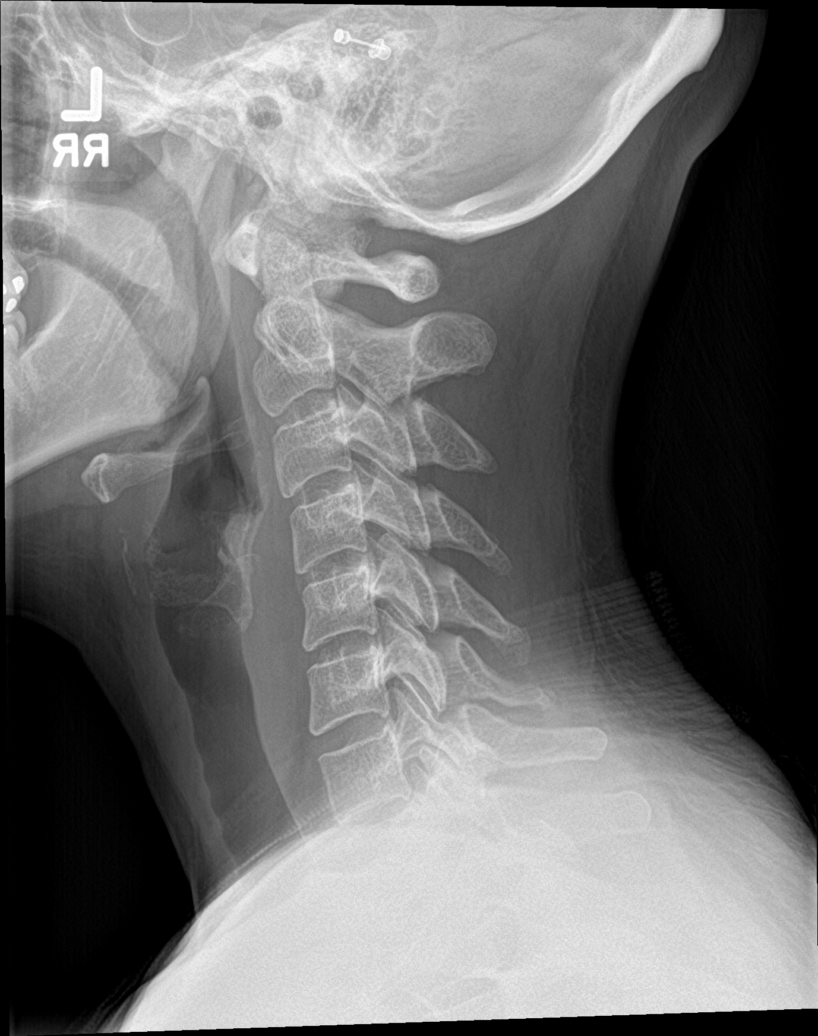

[c-spine obl (1 of 2)]
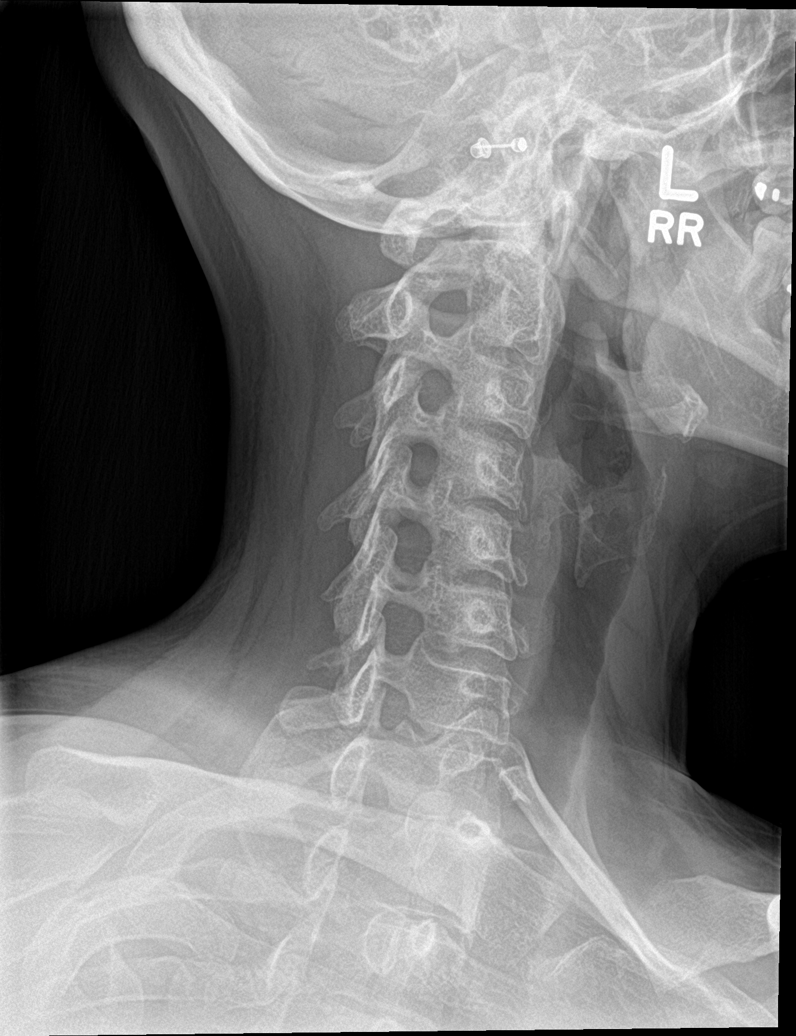

[c-spine obl (2 of 2)]
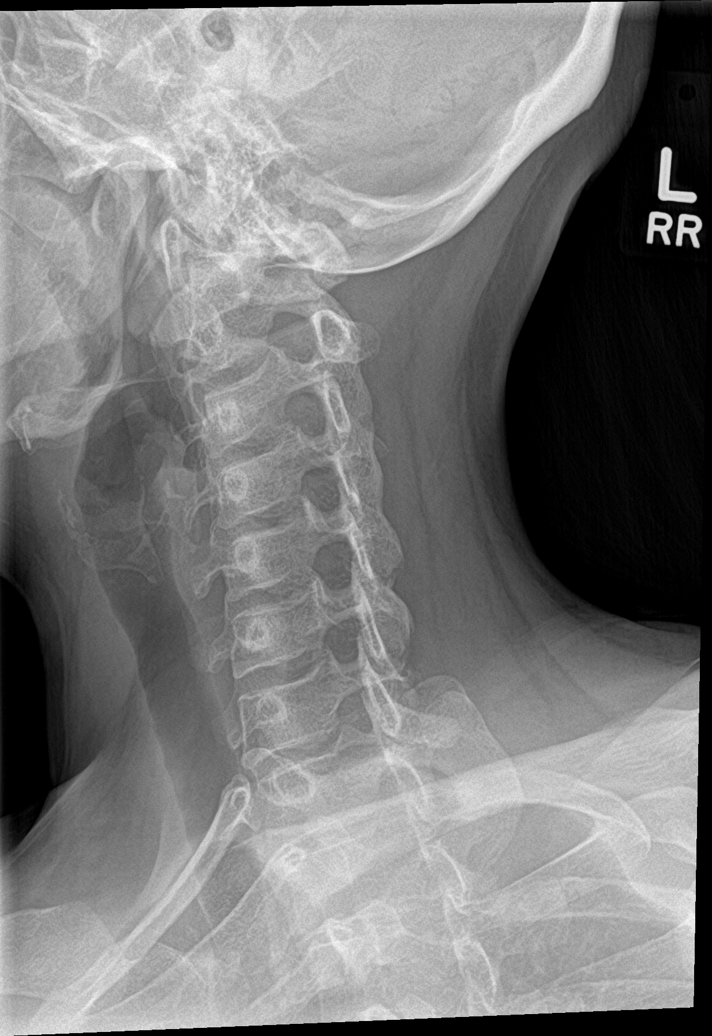

[c-spine ap]
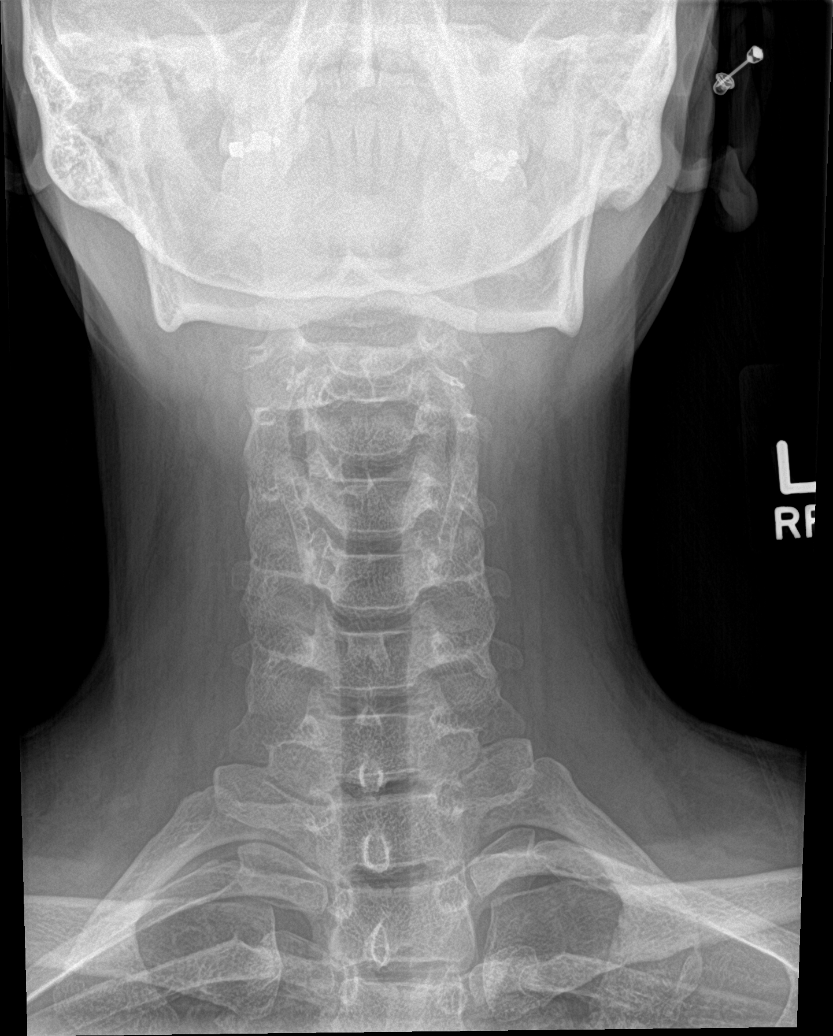

[c-spine open mouth]
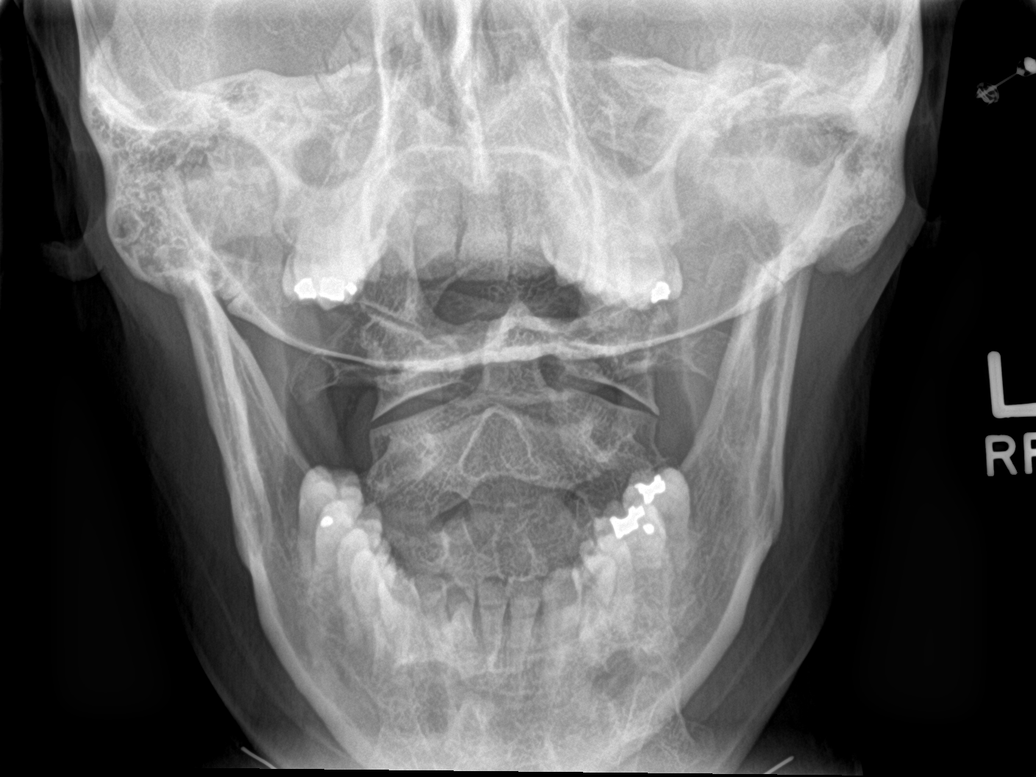

[c-spine swimmers]
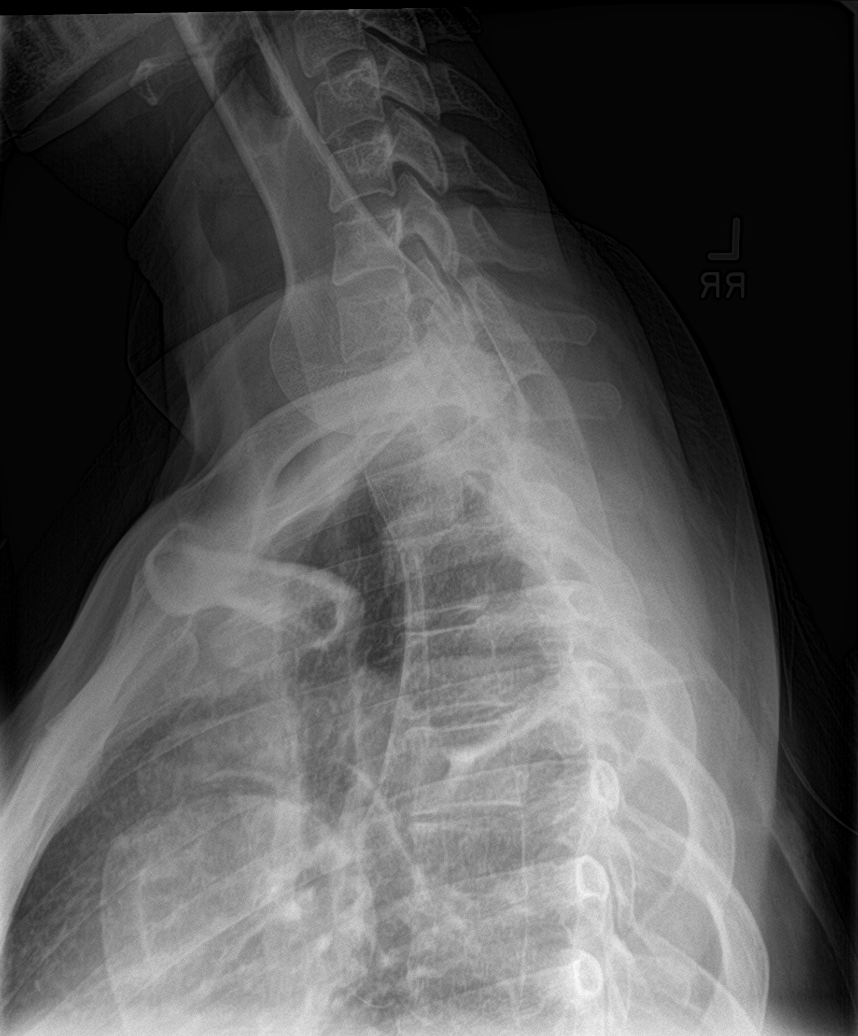

[[person_name]]
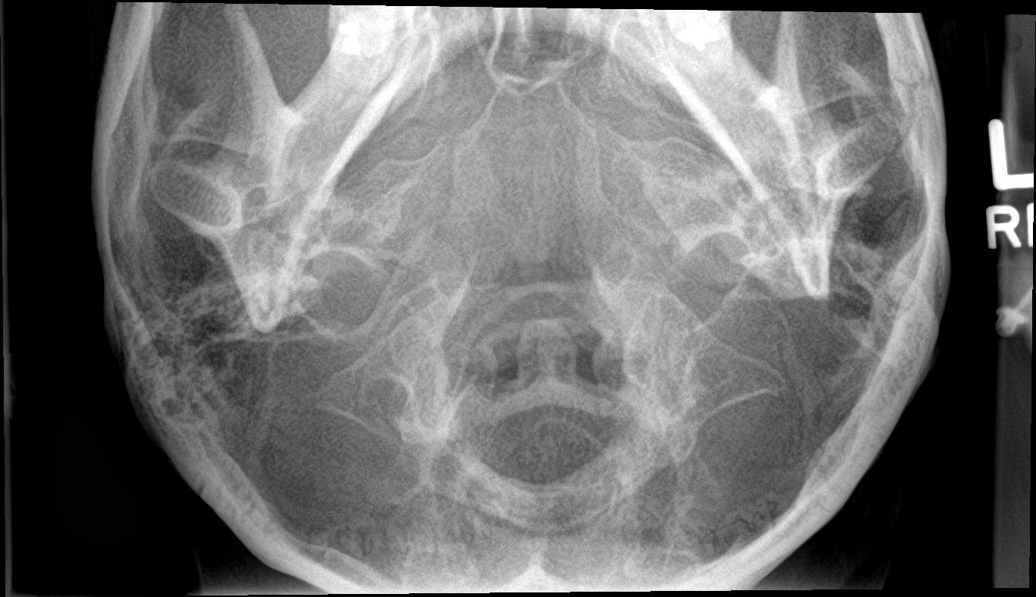

[7 of 7 positions shown; findings below may reference images not displayed]

FINDINGS: There is no evidence of cervical spine fracture or prevertebral soft
tissue swelling. Alignment is normal. No other significant bone
abnormalities are identified.
IMPRESSION: Negative cervical spine radiographs.

## 2020-03-08 MED ORDER — METHYLPHENIDATE HCL ER (OSM) 27 MG PO TBCR: 27.0000 mg | EXTENDED_RELEASE_TABLET | ORAL | 0 refills | Status: DC

## 2020-03-09 ENCOUNTER — Telehealth: Payer: Self-pay

## 2020-03-09 MED ORDER — METHYLPHENIDATE HCL ER (OSM) 27 MG PO TBCR
27.0000 mg | EXTENDED_RELEASE_TABLET | ORAL | 0 refills | Status: DC
Start: 2020-03-09 — End: 2020-03-10

## 2020-03-09 NOTE — Addendum Note (Signed)
Addended bySilvio Pate on: 03/09/2020 04:43 PM   Modules accepted: Orders

## 2020-03-09 NOTE — Telephone Encounter (Signed)
Pt called stating she would like the generic Concerta extended relief  prescription it cost less. She would like it to be sent to the [facility] [place] [address]

## 2020-03-09 NOTE — Telephone Encounter (Signed)
Previously sent as brand name only, pended.

## 2020-03-10 ENCOUNTER — Other Ambulatory Visit: Payer: Self-pay

## 2020-03-10 MED ORDER — METHYLPHENIDATE HCL ER (OSM) 27 MG PO TBCR: 27.0000 mg | EXTENDED_RELEASE_TABLET | ORAL | 0 refills | Status: DC

## 2020-03-10 NOTE — Telephone Encounter (Signed)
Jocelyn Taylor is wanting generic not brand. Please resend to [facility].

## 2020-03-16 NOTE — Telephone Encounter (Signed)
OPEN ERROR

## 2020-04-06 ENCOUNTER — Other Ambulatory Visit: Payer: Self-pay | Admitting: Physician Assistant

## 2020-04-09 MED ORDER — METHYLPHENIDATE HCL ER (OSM) 27 MG PO TBCR: 27.0000 mg | EXTENDED_RELEASE_TABLET | ORAL | 0 refills | Status: DC

## 2020-05-07 ENCOUNTER — Other Ambulatory Visit: Payer: Self-pay | Admitting: Physician Assistant

## 2020-05-07 ENCOUNTER — Telehealth: Payer: Managed Care, Other (non HMO) | Admitting: Physician Assistant

## 2020-05-07 DIAGNOSIS — J019 Acute sinusitis, unspecified: Secondary | ICD-10-CM | POA: Diagnosis not present

## 2020-05-07 MED ORDER — AMOXICILLIN-POT CLAVULANATE 875-125 MG PO TABS: 1.0000 | ORAL_TABLET | Freq: Two times a day (BID) | ORAL | 0 refills | Status: DC

## 2020-05-07 NOTE — Progress Notes (Signed)
We are sorry that you are not feeling well.  Here is how we plan to help!  I will treat you for a sinus infection, but would highly recommend getting tested for COVID as we are still seeing some of it in the community.   Based on what you have shared with me it looks like you have sinusitis.  Sinusitis is inflammation and infection in the sinus cavities of the head.  Based on your presentation I believe you most likely have Acute Bacterial Sinusitis.  This is an infection caused by bacteria and is treated with antibiotics. I have prescribed Augmentin 875mg /125mg  one tablet twice daily with food, for 7 days. You may use an oral decongestant such as Mucinex D or if you have glaucoma or high blood pressure use plain Mucinex. Saline nasal spray help and can safely be used as often as needed for congestion.  If you develop worsening sinus pain, fever or notice severe headache and vision changes, or if symptoms are not better after completion of antibiotic, please schedule an appointment with a health care provider.    Sinus infections are not as easily transmitted as other respiratory infection, however we still recommend that you avoid close contact with loved ones, especially the very young and elderly.  Remember to wash your hands thoroughly throughout the day as this is the number one way to prevent the spread of infection!  Home Care:  Only take medications as instructed by your medical team.  Complete the entire course of an antibiotic.  Do not take these medications with alcohol.  A steam or ultrasonic humidifier can help congestion.  You can place a towel over your head and breathe in the steam from hot water coming from a faucet.  Avoid close contacts especially the very young and the elderly.  Cover your mouth when you cough or sneeze.  Always remember to wash your hands.  Get Help Right Away If:  You develop worsening fever or sinus pain.  You develop a severe head ache or visual  changes.  Your symptoms persist after you have completed your treatment plan.  Make sure you  Understand these instructions.  Will watch your condition.  Will get help right away if you are not doing well or get worse.  Your e-visit answers were reviewed by a board certified advanced clinical practitioner to complete your personal care plan.  Depending on the condition, your plan could have included both over the counter or prescription medications.  If there is a problem please reply  once you have received a response from your provider.  Your safety is important to .  If you have drug allergies check your prescription carefully.    You can use MyChart to ask questions about today's visit, request a non-urgent call back, or ask for a work or school excuse for 24 hours related to this e-Visit. If it has been greater than 24 hours you will need to follow up with your provider, or enter a new e-Visit to address those concerns.  You will get an e-mail in the next two days asking about your experience.  I hope that your e-visit has been valuable and will speed your recovery. Thank you for using e-visits.   Greater than 5 minutes, yet less than 10 minutes of time have been spent researching, coordinating, and implementing care for this patient today

## 2020-05-12 MED ORDER — METHYLPHENIDATE HCL ER (OSM) 27 MG PO TBCR: 27.0000 mg | EXTENDED_RELEASE_TABLET | ORAL | 0 refills | Status: DC

## 2020-05-12 NOTE — Telephone Encounter (Signed)
LVM for patient to call back to get appt scheduled. AM 

## 2020-05-12 NOTE — Telephone Encounter (Signed)
Please schedule appt for patient.

## 2020-05-19 ENCOUNTER — Telehealth (INDEPENDENT_AMBULATORY_CARE_PROVIDER_SITE_OTHER): Payer: Managed Care, Other (non HMO) | Admitting: Physician Assistant

## 2020-05-19 ENCOUNTER — Encounter: Payer: Self-pay | Admitting: Physician Assistant

## 2020-05-19 VITALS — Ht 65.0 in | Wt 185.0 lb

## 2020-05-19 DIAGNOSIS — M79674 Pain in right toe(s): Secondary | ICD-10-CM

## 2020-05-19 DIAGNOSIS — F909 Attention-deficit hyperactivity disorder, unspecified type: Secondary | ICD-10-CM | POA: Diagnosis not present

## 2020-05-19 MED ORDER — DICLOFENAC SODIUM 1 % EX GEL: 4.0000 g | Freq: Four times a day (QID) | CUTANEOUS | 1 refills | Status: DC

## 2020-05-19 MED ORDER — COLCHICINE 0.6 MG PO TABS: ORAL_TABLET | ORAL | 0 refills | Status: DC

## 2020-05-19 NOTE — Progress Notes (Signed)
Right foot  Feels like she broke pinky toe last May  Suitcase  Past 5-6 month having pain at big toe Worse when walking on it No images  Following up for ADHD but doesn't need refills right now  Fairly supportive shoes. Pressure on that part

## 2020-05-21 DIAGNOSIS — M79674 Pain in right toe(s): Secondary | ICD-10-CM | POA: Insufficient documentation

## 2020-05-21 NOTE — Progress Notes (Signed)
Patient ID: Jocelyn Taylor, female   DOB: 10-30-1985, 35 y.o.   MRN: [id number] .Marland KitchenVirtual Visit via Video Note  I connected with Jocelyn Taylor on 05/19/2020 at  9:50 AM EDT by a video enabled telemedicine application and verified that I am speaking with the correct person using two identifiers.  Location: Patient: work Provider: clinic  .Marland KitchenParticipating in visit:  Patient: Jocelyn Taylor Provider: Tandy Gaw PA-C   I discussed the limitations of evaluation and management by telemedicine and the availability of in person appointments. The patient expressed understanding and agreed to proceed.  History of Present Illness: Pt is a 35 yo female with hypothyroidism, ADHD, and chronic low back pain who calls into the clinic for follow-up.  She is doing well on her ADHD medication change.  She went back to the medication she was previously on.  She tolerates this much better.  She denies any increase in anxiety or insomnia.  She does not need refills right now but was told she needed a follow-up.  She denies any headaches or palpitations.  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  She is concerned about pain in her great right toe.  Last name she hit her right fifth metatarsal twice on a suitcase.  It was very painful if she suspected she fractured this area.  It bruised and swelled and was painful for about 8 weeks.  All her symptoms resolved and then suddenly about 6 months ago she started having pain in her same foot right great toe more at the ball of her foot. No new trauma.  She wears fairly good supportive shoes.  She denies any pain over the bunion. It is very warm, red and swollen at times. It is worse when she walks on it and pain is fairly constant. She does wear fairly supportive shoes with little benefit.  She is not really taking any anti-inflammatories.  She has flexible for school and has back patches on the area with little benefit.  .. Active Ambulatory Problems    Diagnosis Date Noted  .  Multiple thyroid nodules 11/07/2013  . Acquired hypothyroidism 11/11/2013  . Hashimoto's thyroiditis 11/12/2013  . Anxiety 11/21/2013  . Inattention 11/21/2013  . Adult ADHD 06/23/2014  . Sacroiliac joint dysfunction of both sides 10/26/2014  . Idiopathic scoliosis 01/22/2015  . Skin nodule 02/10/2017  . 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 Chronic midline thoracic back pain 09/01/2019  . Chronic bilateral low back pain without sciatica 09/01/2019  . Chronic neck pain 11/17/2019  . Myalgia 11/17/2019  . Great toe pain, right 05/21/2020   Resolved Ambulatory Problems    Diagnosis Date Noted  . No Resolved Ambulatory Problems   Past Medical History:  Diagnosis Date  . Goiter     Reviewed med, allergy problem list.    Observations/Objective: No acute distress Normal mood and appearance Normal breathing.   Did not visualize the toe.   .. Today's Vitals   05/19/20 0952  Weight: 185 lb (83.9 kg)  Height: 5\' 5"  (1.651 m)   Body mass index is 30.79 kg/m.    Assessment and Plan: Marland KitchenAna was seen today for adhd and foot pain.  Diagnoses and all orders for this visit:  Adult ADHD  Great toe pain, right -     diclofenac Sodium (VOLTAREN) 1 % GEL; Apply 4 g topically 4 (four) times daily. To affected joint. -     colchicine 0.6 MG tablet; Take 2 tablets first day then 1  tablet a day for 3 days for gout flare. -     DG Foot Complete Right; Future   ADHD good on concerta. Does not need refills right now but switched to different brand and dosing. Doing well. No concerns. Refilled for 6 months pt is very controlled.   Will xray right toe.  Unclear etiology. Very tender per patient. ? Gout. Try colchine and see if helps any. Consider uric acid testing if does.  Sounds like metatarsalgia or osteoarthritis. Continue to wear good supportive shoes.  voltaren gel given.  Oral NSAIDs as needed.  Referral to podiatry made.     Follow Up Instructions:    I discussed the assessment and treatment plan with the patient. The patient was provided an opportunity to ask questions and all were answered. The patient agreed with the plan and demonstrated an understanding of the instructions.   The patient was advised to call back or seek an in-person evaluation if the symptoms worsen or if the condition fails to improve as anticipated.  Tandy Gaw, PA-C

## 2020-06-07 ENCOUNTER — Ambulatory Visit (INDEPENDENT_AMBULATORY_CARE_PROVIDER_SITE_OTHER): Payer: Managed Care, Other (non HMO) | Admitting: Podiatry

## 2020-06-07 ENCOUNTER — Other Ambulatory Visit: Payer: Self-pay

## 2020-06-07 ENCOUNTER — Ambulatory Visit (INDEPENDENT_AMBULATORY_CARE_PROVIDER_SITE_OTHER): Payer: Managed Care, Other (non HMO)

## 2020-06-07 DIAGNOSIS — M79671 Pain in right foot: Secondary | ICD-10-CM

## 2020-06-07 DIAGNOSIS — M79674 Pain in right toe(s): Secondary | ICD-10-CM | POA: Diagnosis not present

## 2020-06-07 DIAGNOSIS — M7751 Other enthesopathy of right foot: Secondary | ICD-10-CM

## 2020-06-07 DIAGNOSIS — M205X1 Other deformities of toe(s) (acquired), right foot: Secondary | ICD-10-CM

## 2020-06-07 IMAGING — DX DG FOOT COMPLETE 3+V*R*
3 series · 3 of 3 positions shown · non-contrast
Comparison: None.

CLINICAL DATA: Great toe pain

EXAM:
RIGHT FOOT COMPLETE - 3+ VIEW

[foot ap]
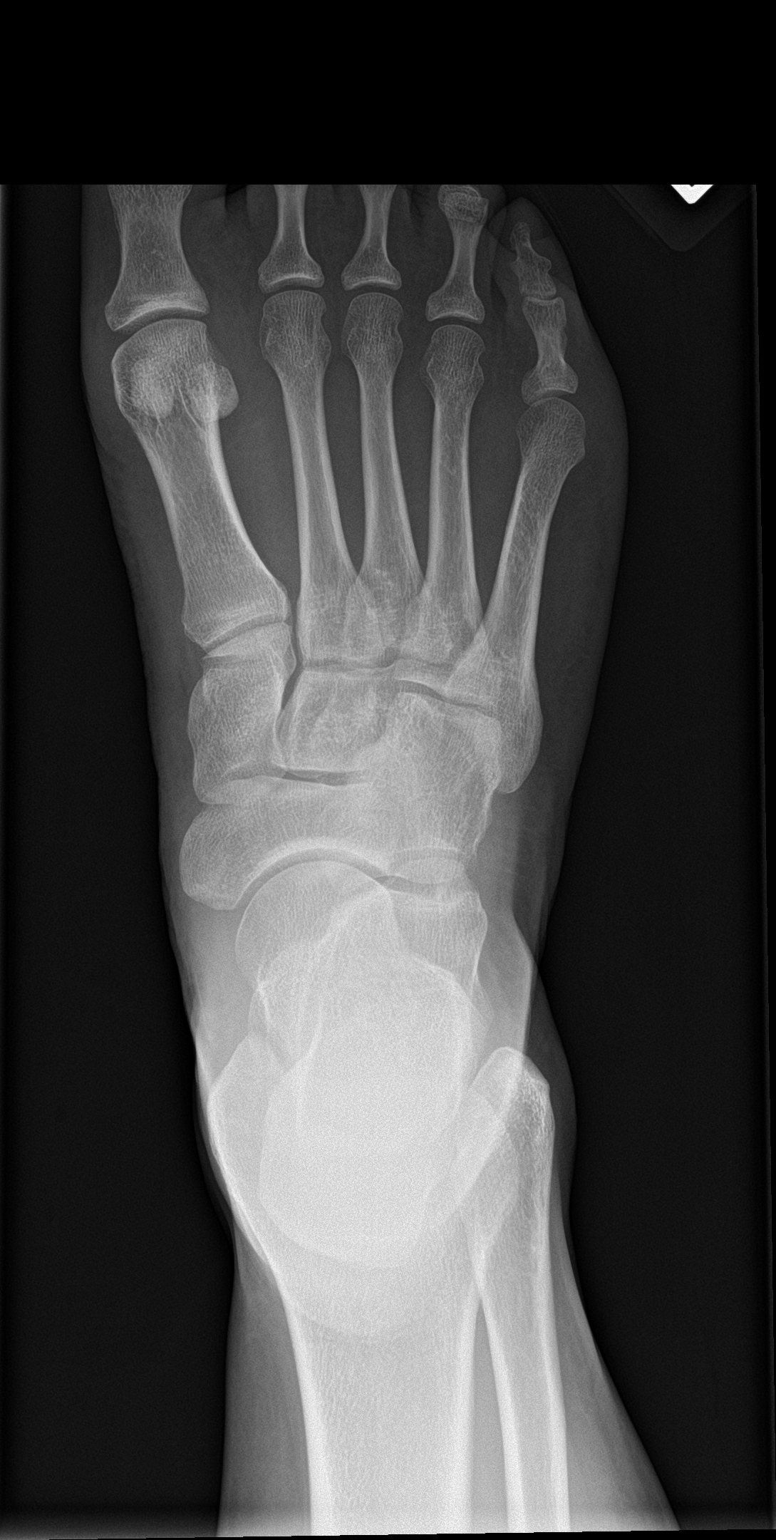

[foot obl]
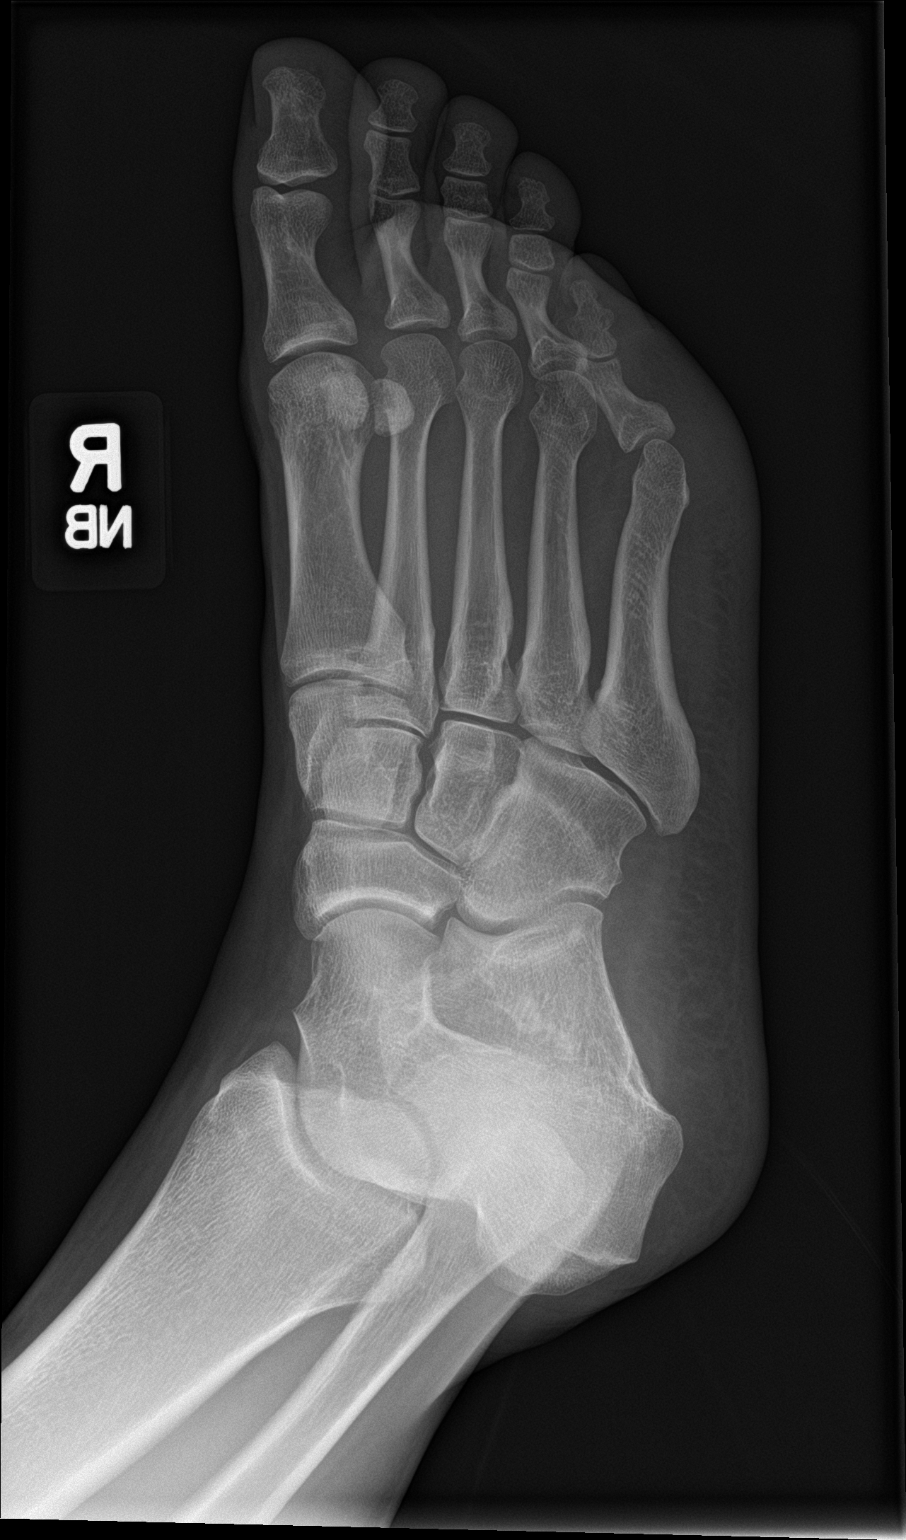

[foot lat]
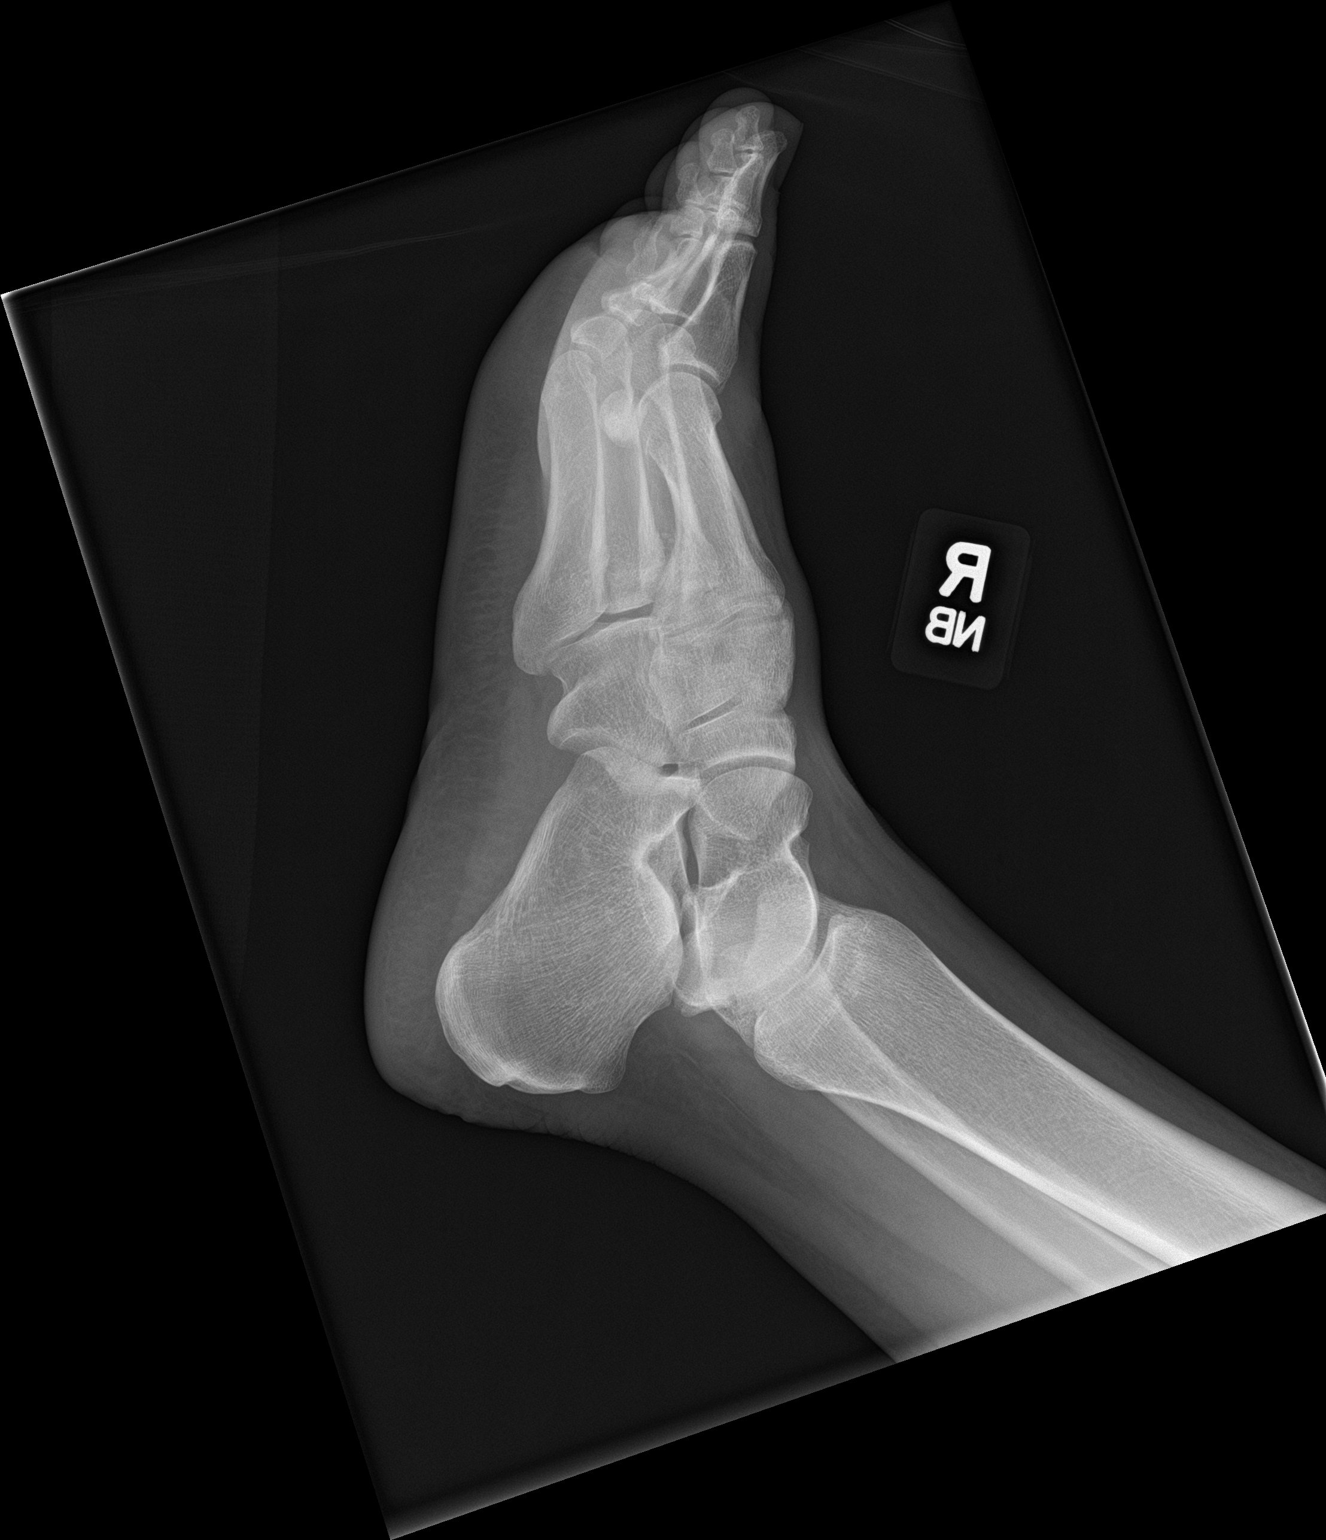

[3 of 3 positions shown; findings below may reference images not displayed]

FINDINGS: There is no evidence of fracture or dislocation. There is no
evidence of arthropathy or other focal bone abnormality. Soft
tissues are unremarkable.
IMPRESSION: Negative.

## 2020-06-08 NOTE — Progress Notes (Signed)
No evidence of any abnormality. Due to the time since injury it could be that the trauma area is small. I would suggest follow up with Dr. Karie Schwalbe sports medicine if this is still bothering you before any additional images like CT are ordered for an in person physical exam.

## 2020-06-10 NOTE — Progress Notes (Signed)
Subjective:   Patient ID: Jocelyn Taylor, female   DOB: 35 y.o.   MRN: [id number]   HPI 35 year old female presents the office today for concerns of discomfort of the right big toe joint which is been ongoing for about 5 months.  She states that she previously saw her primary care physician she was on colchicine which did not help.  She recent x-rays this morning.  No recent injury or falls that she can report.  No other treatment or concerns today.   Review of Systems  All other systems reviewed and are negative.  Past Medical History:  Diagnosis Date  . Goiter     Past Surgical History:  Procedure Laterality Date  . BREAST BIOPSY Right 06/2006  . TUBAL LIGATION Bilateral      Current Outpatient Medications:  .  colchicine 0.6 MG tablet, Take 2 tablets first day then 1 tablet a day for 3 days for gout flare., Disp: 30 tablet, Rfl: 0 .  diclofenac Sodium (VOLTAREN) 1 % GEL, Apply 4 g topically 4 (four) times daily. To affected joint., Disp: 100 g, Rfl: 1 .  ibuprofen (ADVIL) 800 MG tablet, Take 1 tablet (800 mg total) by mouth every 8 (eight) hours as needed., Disp: 90 tablet, Rfl: 0 .  methylphenidate (CONCERTA) 27 MG PO CR tablet, Take 1 tablet (27 mg total) by mouth every morning., Disp: 30 tablet, Rfl: 0 .  methylphenidate (CONCERTA) 27 MG PO CR tablet, Take 1 tablet (27 mg total) by mouth every morning., Disp: 30 tablet, Rfl: 0 .  methylphenidate (CONCERTA) 27 MG PO CR tablet, Take 1 tablet (27 mg total) by mouth every morning., Disp: 30 tablet, Rfl: 0 .  orphenadrine (NORFLEX) 100 MG tablet, Take 1 tablet (100 mg total) by mouth 2 (two) times daily as needed for muscle spasms., Disp: 60 tablet, Rfl: 0  No Known Allergies       Objective:  Physical Exam  General: AAO x3, NAD  Dermatological: Skin is warm, dry and supple bilateral. There are no open sores, no preulcerative lesions, no rash or signs of infection present.  Vascular: Dorsalis Pedis artery and Posterior  Tibial artery pedal pulses are 2/4 bilateral with immedate capillary fill time. There is no pain with calf compression, swelling, warmth, erythema.   Neruologic: Grossly intact via light touch bilateral.    Musculoskeletal: There is mild edema to the right first MPJ there is no significant warmth.  There is faint erythema likely from more from inflammation.  There is mild discomfort with dorsi flexion at the MPJ.  There is no crepitation.  No hypermobility the first ray.  No areas of discomfort.  Gait: Unassisted, Nonantalgic.       Assessment:   35 year old female with capsulitis, hallux limitus right foot     Plan:  -Treatment options discussed including all alternatives, risks, and complications -Etiology of symptoms were discussed -Independent review the x-rays.  These are nonweightbearing however no evidence of acute fracture identified today. -I do think her symptoms are coming more mild hallux limitus, capsulitis of the joint.  I did make an offloading pad for this.  She has inserts but we discussed making new orthotics with first ray cut out to see if this will help improve the symptoms.  Discussed shoe modifications as well.  Offered steroid injection.  She can also use Voltaren gel.  Anti-inflammatories as needed.  Vivi Barrack DPM

## 2020-06-16 ENCOUNTER — Other Ambulatory Visit: Payer: Self-pay | Admitting: Physician Assistant

## 2020-06-16 ENCOUNTER — Encounter: Payer: Self-pay | Admitting: Physician Assistant

## 2020-06-16 DIAGNOSIS — M542 Cervicalgia: Secondary | ICD-10-CM

## 2020-06-16 DIAGNOSIS — M62838 Other muscle spasm: Secondary | ICD-10-CM

## 2020-06-18 ENCOUNTER — Encounter: Payer: Self-pay | Admitting: Sports Medicine

## 2020-06-18 ENCOUNTER — Ambulatory Visit (INDEPENDENT_AMBULATORY_CARE_PROVIDER_SITE_OTHER): Payer: Managed Care, Other (non HMO) | Admitting: Sports Medicine

## 2020-06-18 ENCOUNTER — Other Ambulatory Visit: Payer: Self-pay

## 2020-06-18 DIAGNOSIS — M5136 Other intervertebral disc degeneration, lumbar region: Secondary | ICD-10-CM

## 2020-06-18 DIAGNOSIS — M51369 Other intervertebral disc degeneration, lumbar region without mention of lumbar back pain or lower extremity pain: Secondary | ICD-10-CM | POA: Insufficient documentation

## 2020-06-18 MED ORDER — IBUPROFEN 800 MG PO TABS: 800.0000 mg | ORAL_TABLET | Freq: Three times a day (TID) | ORAL | 2 refills | Status: DC | PRN

## 2020-06-18 MED ORDER — PREDNISONE 50 MG PO TABS: ORAL_TABLET | ORAL | 0 refills | Status: DC

## 2020-06-18 NOTE — Progress Notes (Signed)
    Procedures performed today:    None.  Independent interpretation of notes and tests performed by another provider:   None.  Brief History, Exam, Impression, and Recommendations:    Lumbar degenerative disc disease This is a pleasant 35 year old female, she has a long history of pain in her low back worse with sitting, flexion, Valsalva, radiating into the buttock and thigh but not past the knee. No red flag symptoms, we discussed the evolutionary anthropology of low back pain, we will start with 5 days of prednisone, refilling ibuprofen, aggressive formal physical therapy, x-rays have already been done. Return to see me in 6 weeks, MRI for interventional planning if no better.    ___________________________________________ Ihor Austin. Benjamin Stain, M.D., ABFM., CAQSM. Primary Care and Sports Medicine [facility]  Adjunct Instructor of Family Medicine  [employer]

## 2020-06-18 NOTE — Assessment & Plan Note (Signed)
This is a pleasant 35 year old female, she has a long history of pain in her low back worse with sitting, flexion, Valsalva, radiating into the buttock and thigh but not past the knee. No red flag symptoms, we discussed the evolutionary anthropology of low back pain, we will start with 5 days of prednisone, refilling ibuprofen, aggressive formal physical therapy, x-rays have already been done. Return to see me in 6 weeks, MRI for interventional planning if no better.

## 2020-06-21 ENCOUNTER — Other Ambulatory Visit: Payer: Self-pay

## 2020-06-21 ENCOUNTER — Ambulatory Visit (INDEPENDENT_AMBULATORY_CARE_PROVIDER_SITE_OTHER): Payer: Managed Care, Other (non HMO) | Admitting: Rehabilitative and Restorative Service Providers"

## 2020-06-21 DIAGNOSIS — R29898 Other symptoms and signs involving the musculoskeletal system: Secondary | ICD-10-CM

## 2020-06-21 DIAGNOSIS — M5416 Radiculopathy, lumbar region: Secondary | ICD-10-CM

## 2020-06-21 DIAGNOSIS — R293 Abnormal posture: Secondary | ICD-10-CM | POA: Diagnosis not present

## 2020-06-21 NOTE — Patient Instructions (Addendum)
Access Code: [email or web address]: [email or web address]: 05/02/2022Prepared by: Dimples Probus HoltExercises  Prone Press Up - 2 x daily - 7 x weekly - 1 sets - 10 reps - 2-3 sec hold  Supine Piriformis Stretch with Leg Straight - 2 x daily - 7 x weekly - 1 sets - 3 reps - 30 sec hold  Supine Sciatic Nerve Glide - 2 x daily - 7 x weekly - 1 sets - 8-10 reps - 1-2 sec hold  Supine Transversus Abdominis Bracing with Pelvic Floor Contraction - 2 x daily - 7 x weekly - 1 sets - 10 reps - 10sec hold Patient Education  Posture and Body Mechanics   Trigger Point Dry Needling  . What is Trigger Point Dry Needling (DN)? o DN is a physical therapy technique used to treat muscle pain and dysfunction. Specifically, DN helps deactivate muscle trigger points (muscle knots).  o A thin filiform needle is used to penetrate the skin and stimulate the underlying trigger point. The goal is for a local twitch response (LTR) to occur and for the trigger point to relax. No medication of any kind is injected during the procedure.   . What Does Trigger Point Dry Needling Feel Like?  o The procedure feels different for each individual patient. Some patients report that they do not actually feel the needle enter the skin and overall the process is not painful. Very mild bleeding may occur. However, many patients feel a deep cramping in the muscle in which the needle was inserted. This is the local twitch response.   [name] How Will I feel after the treatment? o Soreness is normal, and the onset of soreness may not occur for a few hours. Typically this soreness does not last longer than two days.  o Bruising is uncommon, however; ice can be used to decrease any possible bruising.  o In rare cases feeling tired or nauseous after the treatment is normal. In addition, your symptoms may get worse before they get better, this period will typically not last longer than 24 hours.   . What Can I do After My Treatment? o Increase  your hydration by drinking more water for the next 24 hours. o You may place ice or heat on the areas treated that have become sore, however, do not use heat on inflamed or bruised areas. Heat often brings more relief post needling. o You can continue your regular activities, but vigorous activity is not recommended initially after the treatment for 24 hours. o DN is best combined with other physical therapy such as strengthening, stretching, and other therapies.

## 2020-06-21 NOTE — Therapy (Signed)
[facility] [address] [place], [place], [address] Phone: [phone]   Fax:  [phone]  Physical Therapy Evaluation  Patient Details  Name: Jocelyn Taylor MRN: [id number] Date of Birth: 24-Oct-1985 Referring Provider (PT): Dr Benjamin Stain   Encounter Date: 06/21/2020   PT End of Session - 06/21/20 0719    Visit Number 1    Number of Visits 12    Date for PT Re-Evaluation 08/02/20    PT Start Time 0718    PT Stop Time 0800    PT Time Calculation (min) 42 min    Activity Tolerance Patient tolerated treatment well           Past Medical History:  Diagnosis Date  . Goiter     Past Surgical History:  Procedure Laterality Date  . BREAST BIOPSY Right 06/2006  . TUBAL LIGATION Bilateral     There were no vitals filed for this visit.    Subjective Assessment - 06/21/20 0722    Subjective Patient reports that she has scoliosis and has had LBP for some time. She has had severe pain in the Rt hip and butt area for about 3 weeks. She has had chiropractic care and used a TENS unit but things are not getting any better. She has no known injury. She awoke with increased pain. She has a 34 yr old who wants her to hold him and regular mom stuff.    Pertinent History scoliosis diagnosed at 35 yr old; L4/5 DDD; 2 children 6 and 3 yrs old vaginal deliveries    Patient Stated Goals get rid of pain and move normally    Currently in Pain? Yes    Pain Score 5     Pain Location Back    Pain Orientation Right    Pain Descriptors / Indicators Sharp;Radiating    Pain Type Acute pain;Chronic pain    Pain Radiating Towards to Rt posterior hip to proximal posterior thigh    Pain Onset 1 to 4 weeks ago    Pain Frequency Constant    Aggravating Factors  worse with turning certain ways; can't get comfortable to sleep; worse with sitting still and standing from sitting; bending; lifting; reaching; coughing; sneezing; car rides    Pain  Relieving Factors meds helped ~20%; less painful after she has been moving for a little while - has a TENS unit for a week but can't tell that she has had much response              [facility] PT Assessment - 06/21/20 0001      Assessment   Medical Diagnosis Lumbar dysfunction    Referring Provider (PT) Dr Benjamin Stain    Onset Date/Surgical Date 06/04/20    Hand Dominance Right    Next MD Visit 6 weeks    Prior Therapy chiripractic care for ~ past 10 yrs ~ monthly      Precautions   Precautions None      Restrictions   Weight Bearing Restrictions No      Balance Screen   Has the patient fallen in the past 6 months No    Has the patient had a decrease in activity level because of a fear of falling?  No    Is the patient reluctant to leave their home because of a fear of falling?  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    Leisure one walk a day for ~ 15 min - has a 3 and 34 yr old; household chores      Observation/Other Assessments   Focus on Therapeutic Outcomes (FOTO)  51      Sensation   Additional Comments WFL's      AROM   Right/Left Hip --   tight end range rotation Rt > Lt   Lumbar Flexion 25% painful    Lumbar Extension 20% less painful that flexion    Lumbar - Right Side Bend 70%    Lumbar - Left Side Bend 70% mild pain    Lumbar - Right Rotation 70%    Lumbar - Left Rotation 70%      Strength   Overall Strength Comments functional strength - not tested resistively      Flexibility   Hamstrings tight Rt ~ 50 deg with pain into Rt buttock; Lt ~ 70 deg    Quadriceps WFL's bilat    ITB WFL's bilat    Piriformis tight Rt      Palpation   Spinal mobility hypomobile lumbar L3/4/5 and sacrum with PA mobs     SI assessment  Rt hemipelvis lower than Lt in standing; scoliosis    Palpation comment muscular tightness in the Rt posterior hip through the piriformis; glut min/med      Special Tests   Other special tests (+) SLR; slump Rt                      Objective measurements completed on examination: See above findings.       [facility] Adult PT Treatment/Exercise - 06/21/20 0001      Lumbar Exercises: Stretches   Press Ups 5 reps   2-3 sec hold   Piriformis Stretch Right;2 reps;30 seconds   supine travell   Other Lumbar Stretch Exercise supine neural mobilization Rt LE 5 reps 2-3 sec hold                  PT Education - 06/21/20 0759    Education Details HEP POC DN posture    Person(s) Educated Patient    Methods Explanation;Demonstration;Tactile cues;Verbal cues;Handout    Comprehension Verbalized understanding;Returned demonstration;Verbal cues required;Tactile cues required               PT Long Term Goals - 06/21/20 1304      PT LONG TERM GOAL #1   Title Decrease pain allowing patient to transfer form sit to stand and walk for functional distances with minimal pain to no or discomfort.    Time 6    Period Weeks    Status New    Target Date 08/02/20      PT LONG TERM GOAL #2   Title Improve core strength and stability allowing patient to preform lifting and bending activities associated with ADL's and work activities with minimal to no pain or discomfort    Time 6    Period Weeks    Status New    Target Date 08/02/20      PT LONG TERM GOAL #3   Title Patient to verbalize and demonstrate proper body mechanics for transfers, lifting, bending    Time 6    Period Weeks    Status New    Target  Date 08/02/20      PT LONG TERM GOAL #4   Title Independent in HEP    Time 6    Period Weeks    Status New    Target Date 08/02/20      PT LONG TERM GOAL #5   Title Improve functional limitation score to 71    Time 6    Period Weeks    Status New     Target Date 08/02/20                  Plan - 06/21/20 1259    Clinical Impression Statement Patient presents with ~ 3 weeks history of Rt sided LBP and Rt LE pain with no known injury. She has a history of LBP over the past 10 yrs or more. She has chiropractic manipulation ~ monthly and has done so for several years. Patient has abnormal posture with Rt hemipelvis lower than Lt in standing; limited trunk and LE mobility and ROM; muscular tightness to palpation through the Rt lumbar and posterior hip musculature; pain with transitional movements and functional activities. Patient will benefit from PT to address problems identified.    Stability/Clinical Decision Making Stable/Uncomplicated    Clinical Decision Making Low    Rehab Potential Good    PT Frequency 2x / week    PT Duration 6 weeks    PT Treatment/Interventions ADLs/Self Care Home Management;Aquatic Therapy;Cryotherapy;Electrical Stimulation;Iontophoresis 4mg /ml Dexamethasone;Moist Heat;Ultrasound;Functional mobility training;Therapeutic activities;Therapeutic exercise;Neuromuscular re-education;Patient/family education;Manual techniques;Dry needling;Taping    PT Next Visit Plan review HEP; progress with core stabilization; DN and manual work Rt posterior hip musculature; modalities as indicated    PT Home Exercise Plan [email or web address]    Consulted and Agree with Plan of Care Patient           Patient will benefit from skilled therapeutic intervention in order to improve the following deficits and impairments:  Decreased range of motion,Increased fascial restricitons,Increased muscle spasms,Pain,Decreased balance,Hypomobility,Impaired flexibility,Improper body mechanics,Other (comment),Decreased mobility,Decreased strength,Postural dysfunction  Visit Diagnosis: Radiculopathy, lumbar region  Other symptoms and signs involving the musculoskeletal system  Abnormal posture     Problem List Patient Active Problem List    Diagnosis Date Noted  . Lumbar degenerative disc disease 06/18/2020  . Great toe pain, right 05/21/2020  . Chronic neck pain 11/17/2019  . Myalgia 11/17/2019  . 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 Skin nodule 02/10/2017  . Idiopathic scoliosis 01/22/2015  . Sacroiliac joint dysfunction of both sides 10/26/2014  . Adult ADHD 06/23/2014  . Anxiety 11/21/2013  . Inattention 11/21/2013  . Hashimoto's thyroiditis 11/12/2013  . Acquired hypothyroidism 11/11/2013  . Multiple thyroid nodules 11/07/2013    Lorriane Dehart 11/09/2013 PT, MPH  06/21/2020, 1:13 PM  [facility] [address] [place], [place], [place] Phone: [phone]   Fax:  [phone]  Name: TALINE NASS MRN: Precious Bard Date of Birth: October 12, 1985

## 2020-06-25 ENCOUNTER — Other Ambulatory Visit: Payer: Self-pay

## 2020-06-25 ENCOUNTER — Ambulatory Visit (INDEPENDENT_AMBULATORY_CARE_PROVIDER_SITE_OTHER): Payer: Managed Care, Other (non HMO) | Admitting: Rehabilitative and Restorative Service Providers"

## 2020-06-25 DIAGNOSIS — M5416 Radiculopathy, lumbar region: Secondary | ICD-10-CM

## 2020-06-25 DIAGNOSIS — R293 Abnormal posture: Secondary | ICD-10-CM

## 2020-06-25 DIAGNOSIS — R29898 Other symptoms and signs involving the musculoskeletal system: Secondary | ICD-10-CM

## 2020-06-25 NOTE — Therapy (Addendum)
[facility] [place] [address] [place], [place], [address] Phone: [phone]   Fax:  [phone]  Physical Therapy Treatment  Patient Details  Name: Jocelyn Taylor MRN: [id number] Date of Birth: April 23, 1985 Referring Provider (PT): Dr Benjamin Stain   Encounter Date: 06/25/2020   PT End of Session - 06/25/20 0830    Visit Number 2    Number of Visits 12    Date for PT Re-Evaluation 08/02/20    PT Start Time 0800    PT Stop Time 0840   MH end of treatment   PT Time Calculation (min) 40 min    Activity Tolerance Patient tolerated treatment well           Past Medical History:  Diagnosis Date  . Goiter     Past Surgical History:  Procedure Laterality Date  . BREAST BIOPSY Right 06/2006  . TUBAL LIGATION Bilateral     There were no vitals filed for this visit.   Subjective Assessment - 06/25/20 0831    Subjective Some better. Having a little more pain this morning - may have slept qrong. Working on exerciess at home.    Currently in Pain? Yes    Pain Score 4     Pain Location Back    Pain Descriptors / Indicators Radiating;[facility] Adult PT Treatment/Exercise - 06/25/20 0001      Lumbar Exercises: Stretches   Press Ups 10 reps   2-3 sec hold   Piriformis Stretch Right;3 reps;30 seconds   supine travell   Other Lumbar Stretch Exercise supine neural mobilization Rt LE 5 reps 2-3 sec hold      Lumbar Exercises: Standing   Wall Slides 10 reps;3 seconds   VC to engage core     Lumbar Exercises: Seated   Sit to Stand 10 reps   VC to engage core     Lumbar Exercises: Supine   Clam 5 reps;3 seconds    Clam Limitations alternating LE's green TB min resistance      Moist Heat Therapy   Number Minutes Moist Heat 8 Minutes    Moist Heat Location Lumbar Spine;Hip      Manual Therapy   Manual therapy comments skilled palpation to assess response to DN and manual  work    Joint Mobilization GT PA mobs    Soft tissue mobilization posterior hip through the Rt piriformis and gluts    Myofascial Release Rt posterior hip    Passive ROM Rt hip IR/ER hip extended knee flexed pt prone           Treatment included dry needling Rt posterior hip in piriformis and glut max/med  Note good release of tight musculature with DN Patient provided with information re- DN         PT Education - 06/25/20 0839    Education Details DN HEP    Person(s) Educated Patient    Methods Explanation;Demonstration;Tactile cues;Verbal cues;Handout    Comprehension Verbalized understanding;Returned demonstration;Verbal cues required;Tactile cues required               PT Long Term Goals - 06/21/20 1304      PT LONG TERM GOAL #1   Title Decrease pain allowing patient to transfer form sit to stand and walk for functional distances with minimal pain to no or  discomfort.    Time 6    Period Weeks    Status New    Target Date 08/02/20      PT LONG TERM GOAL #2   Title Improve core strength and stability allowing patient to preform lifting and bending activities associated with ADL's and work activities with minimal to no pain or discomfort    Time 6    Period Weeks    Status New    Target Date 08/02/20      PT LONG TERM GOAL #3   Title Patient to verbalize and demonstrate proper body mechanics for transfers, lifting, bending    Time 6    Period Weeks    Status New    Target Date 08/02/20      PT LONG TERM GOAL #4   Title Independent in HEP    Time 6    Period Weeks    Status New    Target Date 08/02/20      PT LONG TERM GOAL #5   Title Improve functional limitation score to 71    Time 6    Period Weeks    Status New    Target Date 08/02/20                 Plan - 06/25/20 7616    Clinical Impression Statement Patient reports improvement with HEP. Feels better post DN today. Less radicular pain. Good response to DN, manual work and  exercise today.    Rehab Potential Good    PT Frequency 2x / week    PT Duration 6 weeks    PT Treatment/Interventions ADLs/Self Care Home Management;Aquatic Therapy;Cryotherapy;Electrical Stimulation;Iontophoresis 4mg /ml Dexamethasone;Moist Heat;Ultrasound;Functional mobility training;Therapeutic activities;Therapeutic exercise;Neuromuscular re-education;Patient/family education;Manual techniques;Dry needling;Taping    PT Next Visit Plan review HEP; progress with core stabilization; assess response to DN and manual work Rt posterior hip musculature; modalities as indicated    PT Home Exercise Plan [email or web address]    Consulted and Agree with Plan of Care Patient           Patient will benefit from skilled therapeutic intervention in order to improve the following deficits and impairments:     Visit Diagnosis: Radiculopathy, lumbar region  Other symptoms and signs involving the musculoskeletal system  Abnormal posture     Problem List Patient Active Problem List   Diagnosis Date Noted  . Lumbar degenerative disc disease 06/18/2020  . Great toe pain, right 05/21/2020  . Chronic neck pain 11/17/2019  . Myalgia 11/17/2019  . 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 Skin nodule 02/10/2017  . Idiopathic scoliosis 01/22/2015  . Sacroiliac joint dysfunction of both sides 10/26/2014  . Adult ADHD 06/23/2014  . Anxiety 11/21/2013  . Inattention 11/21/2013  . Hashimoto's thyroiditis 11/12/2013  . Acquired hypothyroidism 11/11/2013  . Multiple thyroid nodules 11/07/2013    Jocelyn Taylor 11/09/2013 PT, MPH  06/25/2020, 8:48 AM  [facility] [address] [place], [place], [place] Phone: [phone]   Fax:  [phone]  Name: Jocelyn Taylor MRN: Precious Bard Date of Birth: Jul 11, 1985

## 2020-06-25 NOTE — Patient Instructions (Addendum)
Trigger Point Dry Needling  . What is Trigger Point Dry Needling (DN)? o DN is a physical therapy technique used to treat muscle pain and dysfunction. Specifically, DN helps deactivate muscle trigger points (muscle knots).  o A thin filiform needle is used to penetrate the skin and stimulate the underlying trigger point. The goal is for a local twitch response (LTR) to occur and for the trigger point to relax. No medication of any kind is injected during the procedure.   . What Does Trigger Point Dry Needling Feel Like?  o The procedure feels different for each individual patient. Some patients report that they do not actually feel the needle enter the skin and overall the process is not painful. Very mild bleeding may occur. However, many patients feel a deep cramping in the muscle in which the needle was inserted. This is the local twitch response.   [name] How Will I feel after the treatment? o Soreness is normal, and the onset of soreness may not occur for a few hours. Typically this soreness does not last longer than two days.  o Bruising is uncommon, however; ice can be used to decrease any possible bruising.  o In rare cases feeling tired or nauseous after the treatment is normal. In addition, your symptoms may get worse before they get better, this period will typically not last longer than 24 hours.   . What Can I do After My Treatment? o Increase your hydration by drinking more water for the next 24 hours. o You may place ice or heat on the areas treated that have become sore, however, do not use heat on inflamed or bruised areas. Heat often brings more relief post needling. o You can continue your regular activities, but vigorous activity is not recommended initially after the treatment for 24 hours. o DN is best combined with other physical therapy such as strengthening, stretching, and other therapies.   [email or web address] Code: [email or web address]: [email or web address]:  05/06/2022Prepared by: Khristine Verno HoltExercises  Prone Press Up - 2 x daily - 7 x weekly - 1 sets - 10 reps - 2-3 sec hold  Supine Piriformis Stretch with Leg Straight - 2 x daily - 7 x weekly - 1 sets - 3 reps - 30 sec hold  Supine Sciatic Nerve Glide - 2 x daily - 7 x weekly - 1 sets - 8-10 reps - 1-2 sec hold  Supine Transversus Abdominis Bracing with Pelvic Floor Contraction - 2 x daily - 7 x weekly - 1 sets - 10 reps - 10sec hold  Hooklying Isometric Clamshell - 2 x daily - 7 x weekly - 1 sets - 10 reps - 3 sec hold  Sit to Stand - 2 x daily - 7 x weekly - 1 sets - 10 reps - 3-5 sec hold  Wall Quarter Squat - 2 x daily - 7 x weekly - 1-2 sets - 10 reps - 5-10 sec hold

## 2020-06-30 ENCOUNTER — Ambulatory Visit (INDEPENDENT_AMBULATORY_CARE_PROVIDER_SITE_OTHER): Payer: Managed Care, Other (non HMO) | Admitting: Physical Therapy

## 2020-06-30 ENCOUNTER — Other Ambulatory Visit: Payer: Self-pay

## 2020-06-30 DIAGNOSIS — R29898 Other symptoms and signs involving the musculoskeletal system: Secondary | ICD-10-CM | POA: Diagnosis not present

## 2020-06-30 DIAGNOSIS — M5416 Radiculopathy, lumbar region: Secondary | ICD-10-CM | POA: Diagnosis not present

## 2020-06-30 DIAGNOSIS — R293 Abnormal posture: Secondary | ICD-10-CM

## 2020-06-30 NOTE — Therapy (Signed)
[facility] [place] [address] [place], [place], [address] Phone: [phone]   Fax:  [phone]  Physical Therapy Treatment  Patient Details  Name: Jocelyn Taylor MRN: [id number] Date of Birth: 27-Oct-1985 Referring Provider (PT): Dr Benjamin Stain   Encounter Date: 06/30/2020   PT End of Session - 06/30/20 1239    Visit Number 3    Number of Visits 12    Date for PT Re-Evaluation 08/02/20    PT Start Time 1147    PT Stop Time 1228    PT Time Calculation (min) 41 min    Activity Tolerance Patient tolerated treatment well    Behavior During Therapy [facility] for tasks assessed/performed           Past Medical History:  Diagnosis Date  . Goiter     Past Surgical History:  Procedure Laterality Date  . BREAST BIOPSY Right 06/2006  . TUBAL LIGATION Bilateral     There were no vitals filed for this visit.   Subjective Assessment - 06/30/20 1148    Subjective Mornings are the worst, stiff and painful (points to Rt SI / glute).  Stretches give some relief.  She notes mild improvement in symptoms after DN.    Pertinent History scoliosis diagnosed at 35 yr old; L4/5 DDD; 2 children 6 and 3 yrs old vaginal deliveries    Patient Stated Goals get rid of pain and move normally    Currently in Pain? Yes    Pain Score 3     Pain Location Back    Pain Orientation Right    Pain Descriptors / Indicators Sharp    Pain Radiating Towards Rt hip to mid posterior thigh    Aggravating Factors  transition to standing after sitting a while; sitting    Pain Relieving Factors meds, some stretches.              [facility] PT Assessment - 06/30/20 0001      Assessment   Medical Diagnosis Lumbar dysfunction    Referring Provider (PT) Dr Benjamin Stain    Onset Date/Surgical Date 06/04/20    Hand Dominance Right    Next MD Visit 6 weeks    Prior Therapy chiripractic care for ~ past 10 yrs ~ monthly            [facility] Adult PT  Treatment/Exercise - 06/30/20 0001      Self-Care   Self-Care Other Self-Care Comments;Posture    Posture Pt encouraged to utilize lumbar support in car and work chair to help avoid sacral sitting.    Other Self-Care Comments  Pt instructed in self-massage with roller stick to Rt hamstring, ball to hip musculature. pt returned demo with cues.  Tenderness found in both areas      Lumbar Exercises: Stretches   Passive Hamstring Stretch Right;3 reps;30 seconds   hookying.   Passive Hamstring Stretch Limitations Unable to straighten RLE while sitting up.  Good tolerance with strap in hooklying.    Standing Extension 3 reps;5 seconds    Piriformis Stretch Right;3 reps;30 seconds   supine travell   Other Lumbar Stretch Exercise supine neural mobilization Rt LE 5 reps 2-3 sec hold      Lumbar Exercises: Seated   Sit to Stand 10 reps   VC to engage core   Other Seated Lumbar Exercises TA sets with lap press x 2 reps of 3-5 sec for demo.      Lumbar Exercises: Supine   Bridge 10  reps   VC to engage core   Other Supine Lumbar Exercises reviewed sit to/from supine via log roll.  pt returned demo 2 x with cues.             PT Long Term Goals - 06/21/20 1304      PT LONG TERM GOAL #1   Title Decrease pain allowing patient to transfer form sit to stand and walk for functional distances with minimal pain to no or discomfort.    Time 6    Period Weeks    Status New    Target Date 08/02/20      PT LONG TERM GOAL #2   Title Improve core strength and stability allowing patient to preform lifting and bending activities associated with ADL's and work activities with minimal to no pain or discomfort    Time 6    Period Weeks    Status New    Target Date 08/02/20      PT LONG TERM GOAL #3   Title Patient to verbalize and demonstrate proper body mechanics for transfers, lifting, bending    Time 6    Period Weeks    Status New    Target Date 08/02/20      PT LONG TERM GOAL #4   Title  Independent in HEP    Time 6    Period Weeks    Status New    Target Date 08/02/20      PT LONG TERM GOAL #5   Title Improve functional limitation score to 71    Time 6    Period Weeks    Status New    Target Date 08/02/20                 Plan - 06/30/20 1234    Clinical Impression Statement Pt unable to straighten RLE (with foot on ground) when in a seated position as this increases pain in Rt SI area.  Pt able to tolerated hooklying stretch to Rt hamstring without increase in pain, as long as leg not lifted too high.  Pt reported reduction of pain in LE and posterior Rt hip after completing stretches and self massage with cues. Pt noticing improvement in pain with transitional movements. Pt leaving for trip by car; given instructions for self care / posture/ using back support to continue making progress towards goals.    Rehab Potential Good    PT Frequency 2x / week    PT Duration 6 weeks    PT Treatment/Interventions ADLs/Self Care Home Management;Aquatic Therapy;Cryotherapy;Electrical Stimulation;Iontophoresis 4mg /ml Dexamethasone;Moist Heat;Ultrasound;Functional mobility training;Therapeutic activities;Therapeutic exercise;Neuromuscular re-education;Patient/family education;Manual techniques;Dry needling;Taping    PT Next Visit Plan progress with core stabilization; DN and manual work Rt biceps femoris / posterior hip musculature.    PT Home Exercise Plan [email or web address]    Consulted and Agree with Plan of Care Patient           Patient will benefit from skilled therapeutic intervention in order to improve the following deficits and impairments:  Decreased range of motion,Increased fascial restricitons,Increased muscle spasms,Pain,Decreased balance,Hypomobility,Impaired flexibility,Improper body mechanics,Other (comment),Decreased mobility,Decreased strength,Postural dysfunction  Visit Diagnosis: Radiculopathy, lumbar region  Other symptoms and signs involving the  musculoskeletal system  Abnormal posture     Problem List Patient Active Problem List   Diagnosis Date Noted  . Lumbar degenerative disc disease 06/18/2020  . Great toe pain, right 05/21/2020  . Chronic neck pain 11/17/2019  . Myalgia 11/17/2019  . ADHD (attention deficit hyperactivity disorder), inattentive  type 12/23/2017  . Skin nodule 02/10/2017  . Idiopathic scoliosis 01/22/2015  . Sacroiliac joint dysfunction of both sides 10/26/2014  . Adult ADHD 06/23/2014  . Anxiety 11/21/2013  . Inattention 11/21/2013  . Hashimoto's thyroiditis 11/12/2013  . Acquired hypothyroidism 11/11/2013  . Multiple thyroid nodules 11/07/2013   Mayer Camel, PTA 06/30/20 12:44 PM  [facility] [place] [address] [place], [place], [address] Phone: [phone]   Fax:  [phone]  Name: Jocelyn Taylor MRN: [id number] Date of Birth: Sep 10, 1985

## 2020-07-02 ENCOUNTER — Encounter: Payer: Managed Care, Other (non HMO) | Admitting: Physical Therapy

## 2020-07-05 ENCOUNTER — Other Ambulatory Visit: Payer: Self-pay

## 2020-07-05 ENCOUNTER — Ambulatory Visit (INDEPENDENT_AMBULATORY_CARE_PROVIDER_SITE_OTHER): Payer: Managed Care, Other (non HMO) | Admitting: Rehabilitative and Restorative Service Providers"

## 2020-07-05 ENCOUNTER — Encounter: Payer: Self-pay | Admitting: Rehabilitative and Restorative Service Providers"

## 2020-07-05 DIAGNOSIS — R293 Abnormal posture: Secondary | ICD-10-CM | POA: Diagnosis not present

## 2020-07-05 DIAGNOSIS — M5416 Radiculopathy, lumbar region: Secondary | ICD-10-CM | POA: Diagnosis not present

## 2020-07-05 DIAGNOSIS — R29898 Other symptoms and signs involving the musculoskeletal system: Secondary | ICD-10-CM

## 2020-07-05 NOTE — Patient Instructions (Addendum)
Access Code: [email or web address]: [email or web address]: 05/16/2022Prepared by: Jazzy Parmer HoltExercises  Prone Press Up - 2 x daily - 7 x weekly - 1 sets - 10 reps - 2-3 sec hold  Supine Piriformis Stretch with Leg Straight - 2 x daily - 7 x weekly - 1 sets - 3 reps - 30 sec hold  Supine Sciatic Nerve Glide - 2 x daily - 7 x weekly - 1 sets - 8-10 reps - 1-2 sec hold  Supine Transversus Abdominis Bracing with Pelvic Floor Contraction - 2 x daily - 7 x weekly - 1 sets - 10 reps - 10sec hold  Hooklying Isometric Clamshell - 2 x daily - 7 x weekly - 1 sets - 10 reps - 3 sec hold  Sit to Stand - 2 x daily - 7 x weekly - 1 sets - 10 reps - 3-5 sec hold  Wall Quarter Squat - 2 x daily - 7 x weekly - 1-2 sets - 10 reps - 5-10 sec hold  Supine Bridge with Resistance Band - 2 x daily - 7 x weekly - 1-2 sets - 10 reps - 5-10 sec hold  Standing Lumbar Extension - 2 x daily - 7 x weekly - 1 sets - 2-3 reps - 2-3 sec hold Trigger Point Dry Needling  . What is Trigger Point Dry Needling (DN)? o DN is a physical therapy technique used to treat muscle pain and dysfunction. Specifically, DN helps deactivate muscle trigger points (muscle knots).  o A thin filiform needle is used to penetrate the skin and stimulate the underlying trigger point. The goal is for a local twitch response (LTR) to occur and for the trigger point to relax. No medication of any kind is injected during the procedure.   . What Does Trigger Point Dry Needling Feel Like?  o The procedure feels different for each individual patient. Some patients report that they do not actually feel the needle enter the skin and overall the process is not painful. Very mild bleeding may occur. However, many patients feel a deep cramping in the muscle in which the needle was inserted. This is the local twitch response.   [name] How Will I feel after the treatment? o Soreness is normal, and the onset of soreness may not occur for a few hours. Typically this  soreness does not last longer than two days.  o Bruising is uncommon, however; ice can be used to decrease any possible bruising.  o In rare cases feeling tired or nauseous after the treatment is normal. In addition, your symptoms may get worse before they get better, this period will typically not last longer than 24 hours.   . What Can I do After My Treatment? o Increase your hydration by drinking more water for the next 24 hours. o You may place ice or heat on the areas treated that have become sore, however, do not use heat on inflamed or bruised areas. Heat often brings more relief post needling. o You can continue your regular activities, but vigorous activity is not recommended initially after the treatment for 24 hours. o DN is best combined with other physical therapy such as strengthening, stretching, and other therapies.

## 2020-07-05 NOTE — Therapy (Signed)
[facility] [place] [address] [place], [place], [address] Phone: [phone]   Fax:  [phone]  Physical Therapy Treatment  Patient Details  Name: Jocelyn Taylor MRN: [id number] Date of Birth: February 22, 1985 Referring Provider (PT): Dr Benjamin Stain   Encounter Date: 07/05/2020   PT End of Session - 07/05/20 1147    Visit Number 4    Number of Visits 12    Date for PT Re-Evaluation 08/02/20    PT Start Time 1147    PT Stop Time 1232    PT Time Calculation (min) 45 min    Activity Tolerance Patient tolerated treatment well           Past Medical History:  Diagnosis Date  . Goiter     Past Surgical History:  Procedure Laterality Date  . BREAST BIOPSY Right 06/2006  . TUBAL LIGATION Bilateral     There were no vitals filed for this visit.   Subjective Assessment - 07/05/20 1148    Subjective Travelling the past 5 days and is having more pain related to sitting in the car and sleeping on a hard bed. She has done some of her exercises at home. DN seemed to help and she was not sore. Can't find a comfortable place at all - standing, sitting, lying down.    Currently in Pain? Yes    Pain Score 5     Pain Location Back    Pain Orientation Right    Pain Descriptors / Indicators Sharp    Pain Type Acute pain;Chronic pain    Pain Radiating Towards Rt posterior hip to thigh and calf    Pain Onset More than a month ago    Pain Frequency Constant              [facility] PT Assessment - 07/05/20 0001      Assessment   Medical Diagnosis Lumbar dysfunction    Referring Provider (PT) Dr Benjamin Stain    Onset Date/Surgical Date 06/04/20    Hand Dominance Right    Next MD Visit 6 weeks    Prior Therapy chiripractic care for ~ past 10 yrs ~ monthly      Flexibility   Piriformis tight Rt      Palpation   Spinal mobility hypomobile lumbar L3/4/5 and sacrum with PA mobs    Palpation comment muscular tightness in the Rt  posterior hip through the piriformis; glut min/med into Rt hamstrings                         [facility] Adult PT Treatment/Exercise - 07/05/20 0001      Lumbar Exercises: Stretches   Passive Hamstring Stretch Right;1 rep;10 seconds    Passive Hamstring Stretch Limitations stretch to nerve tension tolerance - limited mobility    Standing Extension 3 reps;5 seconds    Press Ups 10 reps   2-3 sec hold   Piriformis Stretch Right;3 reps;30 seconds   supine travell   Other Lumbar Stretch Exercise supine neural mobilization Rt LE 5 reps 2-3 sec hold      Lumbar Exercises: Aerobic   Nustep L5 x 6 min UE's 10      Lumbar Exercises: Standing   Wall Slides 10 reps;3 seconds   VC to engage core     Lumbar Exercises: Seated   Sit to Stand 10 reps   VC to engage core     Lumbar Exercises: Supine   Clam 10  reps;3 seconds   alternating LE's VC to engage core   Bridge with clamshell 10 reps;3 seconds   green TB distal thighs   Other Supine Lumbar Exercises sit to/from supine via log roll with cues.      Manual Therapy   Manual therapy comments skilled palpation to assess response to DN and manual work    Joint Mobilization Greater trochanter PA mobs    Soft tissue mobilization posterior hip through the Rt piriformis and gluts; bilat lumbar musculature    Myofascial Release Rt posterior hip    Passive ROM Rt hip IR/ER hip extended knee flexed pt prone            Trigger Point Dry Needling - 07/05/20 0001    Consent Given? Yes    Education Handout Provided Yes    Dry Needling Comments bilat lumbar: Rt posterior hip    Gluteus Minimus Response Palpable increased muscle length    Gluteus Medius Response Palpable increased muscle length    Piriformis Response Palpable increased muscle length    Quadratus Lumborum Response Palpable increased muscle length                PT Education - 07/05/20 1226    Education Details HEP DN    Person(s) Educated Patient    Methods  Explanation;Demonstration;Tactile cues;Verbal cues;Handout    Comprehension Verbalized understanding;Returned demonstration;Verbal cues required;Tactile cues required               PT Long Term Goals - 06/21/20 1304      PT LONG TERM GOAL #1   Title Decrease pain allowing patient to transfer form sit to stand and walk for functional distances with minimal pain to no or discomfort.    Time 6    Period Weeks    Status New    Target Date 08/02/20      PT LONG TERM GOAL #2   Title Improve core strength and stability allowing patient to preform lifting and bending activities associated with ADL's and work activities with minimal to no pain or discomfort    Time 6    Period Weeks    Status New    Target Date 08/02/20      PT LONG TERM GOAL #3   Title Patient to verbalize and demonstrate proper body mechanics for transfers, lifting, bending    Time 6    Period Weeks    Status New    Target Date 08/02/20      PT LONG TERM GOAL #4   Title Independent in HEP    Time 6    Period Weeks    Status New    Target Date 08/02/20      PT LONG TERM GOAL #5   Title Improve functional limitation score to 71    Time 6    Period Weeks    Status New    Target Date 08/02/20                 Plan - 07/05/20 1153    Clinical Impression Statement Persistent and increased pain likely related to travel over the weekend. Discussed and reviewed exercises at length providing patient will print out of all exercises, exercise summary and exercise log. She continues to have nerve irritation with radicular symptoms into the Rt posterior thigh to calf. There is palpable tightness in the QL and lumbar paraspinals bilat as well as into the Rt posterior hip including piriformis and gluts. Good response to DN  and manual work. Needs to progress with core stabilization.    Rehab Potential Good    PT Frequency 2x / week    PT Duration 6 weeks    PT Treatment/Interventions ADLs/Self Care Home  Management;Aquatic Therapy;Cryotherapy;Electrical Stimulation;Iontophoresis 4mg /ml Dexamethasone;Moist Heat;Ultrasound;Functional mobility training;Therapeutic activities;Therapeutic exercise;Neuromuscular re-education;Patient/family education;Manual techniques;Dry needling;Taping    PT Next Visit Plan progress with core stabilization; DN and manual work Rt biceps femoris / posterior hip musculature. Add core stabilization as tolerated - which may include standing TB exercises; antirotation exercise and progress to hinged hip lifting.    PT Home Exercise Plan [email or web address]    Consulted and Agree with Plan of Care Patient           Patient will benefit from skilled therapeutic intervention in order to improve the following deficits and impairments:     Visit Diagnosis: Radiculopathy, lumbar region  Other symptoms and signs involving the musculoskeletal system  Abnormal posture     Problem List Patient Active Problem List   Diagnosis Date Noted  . Lumbar degenerative disc disease 06/18/2020  . Great toe pain, right 05/21/2020  . Chronic neck pain 11/17/2019  . Myalgia 11/17/2019  . 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 Skin nodule 02/10/2017  . Idiopathic scoliosis 01/22/2015  . Sacroiliac joint dysfunction of both sides 10/26/2014  . Adult ADHD 06/23/2014  . Anxiety 11/21/2013  . Inattention 11/21/2013  . Hashimoto's thyroiditis 11/12/2013  . Acquired hypothyroidism 11/11/2013  . Multiple thyroid nodules 11/07/2013    Olney Monier 11/09/2013 PT, MPH  07/05/2020, 12:45 PM  [facility] [address] [place] [address] [place], [place], [place] Phone: [phone]   Fax:  [phone]  Name: Jocelyn Taylor MRN: Precious Bard Date of Birth: 1985-03-12

## 2020-07-07 ENCOUNTER — Other Ambulatory Visit: Payer: Self-pay

## 2020-07-07 ENCOUNTER — Ambulatory Visit (INDEPENDENT_AMBULATORY_CARE_PROVIDER_SITE_OTHER): Payer: Managed Care, Other (non HMO) | Admitting: Rehabilitative and Restorative Service Providers"

## 2020-07-07 DIAGNOSIS — R293 Abnormal posture: Secondary | ICD-10-CM

## 2020-07-07 DIAGNOSIS — M5416 Radiculopathy, lumbar region: Secondary | ICD-10-CM

## 2020-07-07 DIAGNOSIS — R29898 Other symptoms and signs involving the musculoskeletal system: Secondary | ICD-10-CM

## 2020-07-07 NOTE — Patient Instructions (Signed)
Access Code: [email or web address]: [email or web address]: 05/18/2022Prepared by: Zacary Bauer HoltExercises  Prone Press Up - 2 x daily - 7 x weekly - 1 sets - 10 reps - 2-3 sec hold  Supine Piriformis Stretch with Leg Straight - 2 x daily - 7 x weekly - 1 sets - 3 reps - 30 sec hold  Supine Sciatic Nerve Glide - 2 x daily - 7 x weekly - 1 sets - 8-10 reps - 1-2 sec hold  Supine Transversus Abdominis Bracing with Pelvic Floor Contraction - 2 x daily - 7 x weekly - 1 sets - 10 reps - 10sec hold  Hooklying Isometric Clamshell - 2 x daily - 7 x weekly - 1 sets - 10 reps - 3 sec hold  Sit to Stand - 2 x daily - 7 x weekly - 1 sets - 10 reps - 3-5 sec hold  Wall Quarter Squat - 2 x daily - 7 x weekly - 1-2 sets - 10 reps - 5-10 sec hold  Supine Bridge with Resistance Band - 2 x daily - 7 x weekly - 1-2 sets - 10 reps - 5-10 sec hold  Standing Lumbar Extension - 2 x daily - 7 x weekly - 1 sets - 2-3 reps - 2-3 sec hold  Standing Bilateral Low Shoulder Row with Anchored Resistance - 2 x daily - 7 x weekly - 1-3 sets - 10 reps - 2-3 sec hold  Shoulder Extension with Resistance - 2 x daily - 7 x weekly - 1-3 sets - 10 reps - 2-3 sec hold  Anti-Rotation Lateral Stepping with Press - 2 x daily - 7 x weekly - 1-2 sets - 10 reps - 2-3 sec hold

## 2020-07-07 NOTE — Therapy (Signed)
[facility] [place] [address] [place] [address] [place], [place], [address] Phone: [phone]   Fax:  [phone]  Physical Therapy Treatment  Patient Details  Name: Jocelyn Taylor MRN: [id number] Date of Birth: November 15, 1985 Referring Provider (PT): Dr Benjamin Stain   Encounter Date: 07/07/2020   PT End of Session - 07/07/20 1150    Visit Number 5    Number of Visits 12    Date for PT Re-Evaluation 08/02/20    PT Start Time 1149    PT Stop Time 1230    PT Time Calculation (min) 41 min    Activity Tolerance Patient tolerated treatment well           Past Medical History:  Diagnosis Date  . Goiter     Past Surgical History:  Procedure Laterality Date  . BREAST BIOPSY Right 06/2006  . TUBAL LIGATION Bilateral     There were no vitals filed for this visit.   Subjective Assessment - 07/07/20 1151    Subjective DN seemed to help but she was a little sore after DN but felt it helped some. Working on exercises at home.    Currently in Pain? Yes    Pain Score 4     Pain Location Back    Pain Orientation Right    Pain Descriptors / Indicators Sharp    Pain Type Chronic pain    Pain Onset More than a month ago    Pain Frequency Constant                             [facility] Adult PT Treatment/Exercise - 07/07/20 0001      Lumbar Exercises: Stretches   Passive Hamstring Stretch Right;1 rep;10 seconds    Passive Hamstring Stretch Limitations stretch to nerve tension tolerance - improving mobility    Standing Extension 3 reps;5 seconds    Press Ups 5 reps   2-3 sec hold through available range   Piriformis Stretch Right;30 seconds;1 rep   supine travell   Other Lumbar Stretch Exercise supine neural mobilization Rt LE 5 reps 2-3 sec hold      Lumbar Exercises: Standing   Wall Slides 10 reps;3 seconds   VC to engage core   Row Strengthening;Both;10 reps;Theraband    Theraband Level (Row) Level 3 (Green)    Shoulder  Extension Strengthening;Both;10 reps;Theraband    Theraband Level (Shoulder Extension) Level 3 (Green)    Other Standing Lumbar Exercises antirotation green TB x 10 reps each side      Lumbar Exercises: Seated   Sit to Stand 10 reps   VC to engage core     Manual Therapy   Manual therapy comments skilled palpation to assess response to DN and manual work    Joint Mobilization Greater trochanter PA mobs    Soft tissue mobilization posterior hip through the Rt piriformis and gluts; bilat lumbar musculature    Myofascial Release Rt posterior hip    Passive ROM Rt hip IR/ER hip extended knee flexed pt prone            Trigger Point Dry Needling - 07/07/20 0001    Consent Given? Yes    Education Handout Provided Previously provided    Dry Needling Comments bilat lumbar: Rt posterior hip    Gluteus Minimus Response Palpable increased muscle length    Gluteus Medius Response Palpable increased muscle length    Piriformis Response Palpable increased muscle length  Lumbar multifidi Response Palpable increased muscle length    Quadratus Lumborum Response Palpable increased muscle length                PT Education - 07/07/20 1200    Education Details HEP    Person(s) Educated Patient    Methods Explanation;Demonstration;Tactile cues;Verbal cues;Handout    Comprehension Verbalized understanding;Returned demonstration;Verbal cues required;Tactile cues required               PT Long Term Goals - 06/21/20 1304      PT LONG TERM GOAL #1   Title Decrease pain allowing patient to transfer form sit to stand and walk for functional distances with minimal pain to no or discomfort.    Time 6    Period Weeks    Status New    Target Date 08/02/20      PT LONG TERM GOAL #2   Title Improve core strength and stability allowing patient to preform lifting and bending activities associated with ADL's and work activities with minimal to no pain or discomfort    Time 6    Period Weeks     Status New    Target Date 08/02/20      PT LONG TERM GOAL #3   Title Patient to verbalize and demonstrate proper body mechanics for transfers, lifting, bending    Time 6    Period Weeks    Status New    Target Date 08/02/20      PT LONG TERM GOAL #4   Title Independent in HEP    Time 6    Period Weeks    Status New    Target Date 08/02/20      PT LONG TERM GOAL #5   Title Improve functional limitation score to 71    Time 6    Period Weeks    Status New    Target Date 08/02/20                 Plan - 07/07/20 1152    Clinical Impression Statement Patient feels she is making progress with less intense pain. DN is helpful. Good improvement in tissue extensibility and neural tension with DN and manual work. Reviewed exercises and added core stabilization in standing using theraband; will progress with lifting    Rehab Potential Good    PT Frequency 2x / week    PT Duration 6 weeks    PT Treatment/Interventions ADLs/Self Care Home Management;Aquatic Therapy;Cryotherapy;Electrical Stimulation;Iontophoresis 4mg /ml Dexamethasone;Moist Heat;Ultrasound;Functional mobility training;Therapeutic activities;Therapeutic exercise;Neuromuscular re-education;Patient/family education;Manual techniques;Dry needling;Taping    PT Next Visit Plan progress with core stabilization; DN and manual work Rt biceps femoris / posterior hip musculature. Progress with core stabilization and add lifting as tolerated    PT Home Exercise Plan [email or web address]    Consulted and Agree with Plan of Care Patient           Patient will benefit from skilled therapeutic intervention in order to improve the following deficits and impairments:     Visit Diagnosis: Radiculopathy, lumbar region  Other symptoms and signs involving the musculoskeletal system  Abnormal posture     Problem List Patient Active Problem List   Diagnosis Date Noted  . Lumbar degenerative disc disease 06/18/2020  . Great toe pain,  right 05/21/2020  . Chronic neck pain 11/17/2019  . Myalgia 11/17/2019  . 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 Skin nodule 02/10/2017  . Idiopathic scoliosis 01/22/2015  . Sacroiliac joint dysfunction of  both sides 10/26/2014  . Adult ADHD 06/23/2014  . Anxiety 11/21/2013  . Inattention 11/21/2013  . Hashimoto's thyroiditis 11/12/2013  . Acquired hypothyroidism 11/11/2013  . Multiple thyroid nodules 11/07/2013    Braxxton Stoudt Rober Minion PT, MPH  07/07/2020, 12:33 PM  [facility] [address] [place] [address] [place], [place], [address] Phone: [phone]   Fax:  [phone]  Name: KONNOR JORDEN MRN: [id number] Date of Birth: 1985-08-13

## 2020-07-12 ENCOUNTER — Encounter: Payer: Self-pay | Admitting: Rehabilitative and Restorative Service Providers"

## 2020-07-12 ENCOUNTER — Ambulatory Visit (INDEPENDENT_AMBULATORY_CARE_PROVIDER_SITE_OTHER): Payer: Managed Care, Other (non HMO) | Admitting: Rehabilitative and Restorative Service Providers"

## 2020-07-12 ENCOUNTER — Other Ambulatory Visit: Payer: Self-pay

## 2020-07-12 DIAGNOSIS — R293 Abnormal posture: Secondary | ICD-10-CM | POA: Diagnosis not present

## 2020-07-12 DIAGNOSIS — R29898 Other symptoms and signs involving the musculoskeletal system: Secondary | ICD-10-CM

## 2020-07-12 DIAGNOSIS — M5416 Radiculopathy, lumbar region: Secondary | ICD-10-CM | POA: Diagnosis not present

## 2020-07-12 NOTE — Therapy (Signed)
[facility] [place] [address] [place], [place], [address] Phone: [phone]   Fax:  [phone]  Physical Therapy Treatment  Patient Details  Name: Jocelyn Taylor MRN: [id number] Date of Birth: 1985-08-04 Referring Provider (PT): Dr Benjamin Stain   Encounter Date: 07/12/2020   PT End of Session - 07/12/20 1151    Visit Number 6    Number of Visits 12    Date for PT Re-Evaluation 08/02/20    PT Start Time 1150    PT Stop Time 1233    PT Time Calculation (min) 43 min           Past Medical History:  Diagnosis Date  . Goiter     Past Surgical History:  Procedure Laterality Date  . BREAST BIOPSY Right 06/2006  . TUBAL LIGATION Bilateral     There were no vitals filed for this visit.   Subjective Assessment - 07/12/20 1226    Subjective Can tell that her hip and leg feel so much better after the last DN treatment. Can walk better. Having less pain. Awoke Sunday with some increased LBP but it resolved after stretching. Still has pain with sitting and especially with sit to stand. Working on exercises at home. Has been having foot pain but can tell foot pain is decreased with core engaged when walking.    Currently in Pain? Yes    Pain Score 3     Pain Location Back    Pain Orientation Right    Pain Descriptors / Indicators Sharp    Pain Type Chronic pain    Pain Radiating Towards Rt posterior hip - infrequently has pain in thigh and calf    Pain Onset More than a month ago    Pain Frequency Constant              [facility] PT Assessment - 07/12/20 0001      Assessment   Medical Diagnosis Lumbar dysfunction    Referring Provider (PT) Dr Benjamin Stain    Onset Date/Surgical Date 06/04/20    Hand Dominance Right    Next MD Visit 6 weeks    Prior Therapy chiripractic care for ~ past 10 yrs ~ monthly      Posture/Postural Control   Posture Comments improving posture with core engaged      Palpation   Spinal  mobility hypomobile lumbar L3/4/5 and sacrum with PA mobs - decreased pain with testing    Palpation comment muscular tightness in the Rt posterior hip through the piriformis; glut min/med into Rt hamstrings; Rt/Lt QL/lumbar paraspinals/lats                         [facility] Adult PT Treatment/Exercise - 07/12/20 0001      Lumbar Exercises: Stretches   Standing Extension 3 reps;5 seconds    Standing Extension Limitations repeated during treatment 1-2 x/post exercises    Press Ups 5 reps   2-3 sec hold through available range     Lumbar Exercises: Standing   Lifting Limitations working on lifting from 12 and 20 inch surfaces which created some back pain - not given for home program    Wall Slides 3 seconds;20 reps   VC to engage core   Wall Slides Limitations repeated with 10# KB for push out and pull back in squat position x 25 reps      Lumbar Exercises: Seated   Sit to Stand 20 reps   VC  to engage core     Lumbar Exercises: Quadruped   Opposite Arm/Leg Raise Right arm/Left leg;Left arm/Right leg;10 reps;2 seconds    Plank counter plank 60 sec x 3 reps      Manual Therapy   Manual therapy comments skilled palpation to assess response to DN and manual work    Joint Mobilization Greater trochanter PA mobs    Soft tissue mobilization posterior hip through the Rt piriformis and gluts; bilat lumbar musculature    Myofascial Release Rt posterior hip    Passive ROM Rt hip IR/ER hip extended knee flexed pt prone            Trigger Point Dry Needling - 07/12/20 0001    Consent Given? Yes    Education Handout Provided Previously provided    Dry Needling Comments bilat lumbar: Rt posterior hip    Gluteus Minimus Response Palpable increased muscle length    Gluteus Medius Response Palpable increased muscle length    Gluteus Maximus Response Palpable increased muscle length    Piriformis Response Palpable increased muscle length    Lumbar multifidi Response Palpable increased  muscle length    Quadratus Lumborum Response Palpable increased muscle length                PT Education - 07/12/20 1225    Education Details HEP    Person(s) Educated Patient    Methods Explanation;Demonstration;Tactile cues;Verbal cues;Handout    Comprehension Verbalized understanding;Returned demonstration;Verbal cues required;Tactile cues required               PT Long Term Goals - 06/21/20 1304      PT LONG TERM GOAL #1   Title Decrease pain allowing patient to transfer form sit to stand and walk for functional distances with minimal pain to no or discomfort.    Time 6    Period Weeks    Status New    Target Date 08/02/20      PT LONG TERM GOAL #2   Title Improve core strength and stability allowing patient to preform lifting and bending activities associated with ADL's and work activities with minimal to no pain or discomfort    Time 6    Period Weeks    Status New    Target Date 08/02/20      PT LONG TERM GOAL #3   Title Patient to verbalize and demonstrate proper body mechanics for transfers, lifting, bending    Time 6    Period Weeks    Status New    Target Date 08/02/20      PT LONG TERM GOAL #4   Title Independent in HEP    Time 6    Period Weeks    Status New    Target Date 08/02/20      PT LONG TERM GOAL #5   Title Improve functional limitation score to 71    Time 6    Period Weeks    Status New    Target Date 08/02/20                 Plan - 07/12/20 1209    Clinical Impression Statement Good progress with decresaed pain and decreased Rt LE radicular symptoms. Patient has added core stabilization exercises and is making adjustments to ADL's and work tasks, improving her positioning with sitting, sit to stand, standing, walking, bending, lifting. Will progress with core stabilization activities at home    Rehab Potential Good    PT Frequency 2x /  week    PT Duration 6 weeks    PT Treatment/Interventions ADLs/Self Care Home  Management;Aquatic Therapy;Cryotherapy;Electrical Stimulation;Iontophoresis 4mg /ml Dexamethasone;Moist Heat;Ultrasound;Functional mobility training;Therapeutic activities;Therapeutic exercise;Neuromuscular re-education;Patient/family education;Manual techniques;Dry needling;Taping    PT Next Visit Plan progress with core stabilization; DN and manual work Rt biceps femoris / posterior hip musculature. Progress with core stabilization and add lifting as tolerated    PT Home Exercise Plan [email or web address]    Consulted and Agree with Plan of Care Patient           Patient will benefit from skilled therapeutic intervention in order to improve the following deficits and impairments:     Visit Diagnosis: Radiculopathy, lumbar region  Other symptoms and signs involving the musculoskeletal system  Abnormal posture     Problem List Patient Active Problem List   Diagnosis Date Noted  . Lumbar degenerative disc disease 06/18/2020  . Great toe pain, right 05/21/2020  . Chronic neck pain 11/17/2019  . Myalgia 11/17/2019  . 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 Skin nodule 02/10/2017  . Idiopathic scoliosis 01/22/2015  . Sacroiliac joint dysfunction of both sides 10/26/2014  . Adult ADHD 06/23/2014  . Anxiety 11/21/2013  . Inattention 11/21/2013  . Hashimoto's thyroiditis 11/12/2013  . Acquired hypothyroidism 11/11/2013  . Multiple thyroid nodules 11/07/2013    Zakya Halabi 11/09/2013 PT, MPH  07/12/2020, 12:52 PM  [facility] [address] [place], [place], [place] Phone: [phone]   Fax:  [phone]  Name: Jocelyn Taylor MRN: Precious Bard Date of Birth: 02/16/1986

## 2020-07-12 NOTE — Patient Instructions (Signed)
Access Code: [email or web address]: [email or web address]: 05/23/2022Prepared by: Kialee Kham HoltExercises  Prone Press Up - 2 x daily - 7 x weekly - 1 sets - 10 reps - 2-3 sec hold  Supine Piriformis Stretch with Leg Straight - 2 x daily - 7 x weekly - 1 sets - 3 reps - 30 sec hold  Supine Sciatic Nerve Glide - 2 x daily - 7 x weekly - 1 sets - 8-10 reps - 1-2 sec hold  Supine Transversus Abdominis Bracing with Pelvic Floor Contraction - 2 x daily - 7 x weekly - 1 sets - 10 reps - 10sec hold  Hooklying Isometric Clamshell - 2 x daily - 7 x weekly - 1 sets - 10 reps - 3 sec hold  Sit to Stand - 2 x daily - 7 x weekly - 1 sets - 10 reps - 3-5 sec hold  Wall Quarter Squat - 2 x daily - 7 x weekly - 1-2 sets - 10 reps - 5-10 sec hold  Supine Bridge with Resistance Band - 2 x daily - 7 x weekly - 1-2 sets - 10 reps - 5-10 sec hold  Standing Lumbar Extension - 2 x daily - 7 x weekly - 1 sets - 2-3 reps - 2-3 sec hold  Standing Bilateral Low Shoulder Row with Anchored Resistance - 2 x daily - 7 x weekly - 1-3 sets - 10 reps - 2-3 sec hold  Shoulder Extension with Resistance - 2 x daily - 7 x weekly - 1-3 sets - 10 reps - 2-3 sec hold  Anti-Rotation Lateral Stepping with Press - 2 x daily - 7 x weekly - 1-2 sets - 10 reps - 2-3 sec hold  Plank on Counter - 2 x daily - 7 x weekly - 1 sets - 3 reps - 60 sec hold

## 2020-07-15 ENCOUNTER — Other Ambulatory Visit: Payer: Self-pay

## 2020-07-15 ENCOUNTER — Ambulatory Visit (INDEPENDENT_AMBULATORY_CARE_PROVIDER_SITE_OTHER): Payer: Managed Care, Other (non HMO) | Admitting: Rehabilitative and Restorative Service Providers"

## 2020-07-15 ENCOUNTER — Encounter: Payer: Self-pay | Admitting: Rehabilitative and Restorative Service Providers"

## 2020-07-15 DIAGNOSIS — R29898 Other symptoms and signs involving the musculoskeletal system: Secondary | ICD-10-CM

## 2020-07-15 DIAGNOSIS — R293 Abnormal posture: Secondary | ICD-10-CM

## 2020-07-15 DIAGNOSIS — M5416 Radiculopathy, lumbar region: Secondary | ICD-10-CM

## 2020-07-15 NOTE — Therapy (Signed)
[facility] [place] [address] [place], [place], [address] Phone: [phone]   Fax:  [phone]  Physical Therapy Treatment  Patient Details  Name: Jocelyn Taylor MRN: [id number] Date of Birth: [birth year] Referring Provider (PT): Dr Benjamin Stain   Encounter Date: 07/15/2020   PT End of Session - 07/15/20 1151    Visit Number 7    Number of Visits 12    Date for PT Re-Evaluation 08/02/20    PT Start Time 1150    PT Stop Time 1235    PT Time Calculation (min) 45 min    Activity Tolerance Patient tolerated treatment well           Past Medical History:  Diagnosis Date  . Goiter     Past Surgical History:  Procedure Laterality Date  . BREAST BIOPSY Right 06/2006  . TUBAL LIGATION Bilateral     There were no vitals filed for this visit.   Subjective Assessment - 07/15/20 1152    Subjective Still hurts. Painful when she wakes up. She has pain when she moves sit to stand. She has been working on exercises. She notices the nerve stretch is gettting further which is better. She continues to have pain on an almost constant basis. She has done more sitting today. Nothing relieves pain fully.    Currently in Pain? Yes    Pain Score 5     Pain Location Back    Pain Orientation Right    Pain Descriptors / Indicators Sharp    Pain Type Chronic pain    Pain Onset More than a month ago    Pain Frequency Constant                             [facility] Adult PT Treatment/Exercise - 07/15/20 0001      Self-Care   Other Self-Care Comments  myofacial ball release to pelvic floor sitting on small yellow ball      Lumbar Exercises: Stretches   Standing Extension 3 reps;5 seconds    Press Ups 10 reps   2-3 sec hold through available range     Lumbar Exercises: Standing   Wall Slides 3 seconds;20 reps   VC to engage core; 10# KB push pull   Row Strengthening;Both;10 reps;Theraband    Theraband Level (Row)  Level 4 (Blue)    Shoulder Extension Strengthening;Both;10 reps;Theraband    Theraband Level (Shoulder Extension) Level 4 (Blue)    Shoulder Extension Limitations lat pull x 10 blue TB    Other Standing Lumbar Exercises antirotation green TB x 10 reps each side      Lumbar Exercises: Seated   Sit to Stand 20 reps   VC to engage core     Lumbar Exercises: Quadruped   Opposite Arm/Leg Raise Right arm/Left leg;Left arm/Right leg;10 reps;2 seconds    Plank counter plank 60 sec x 3 reps      Manual Therapy   Manual therapy comments skilled palpation to assess response to DN and manual work    Joint Mobilization Greater trochanter PA mobs    Soft tissue mobilization posterior hip through the Rt piriformis and gluts; bilat lumbar musculature; Lt > Rt tissue btn ischial tuberosity to coccyx    Myofascial Release Rt posterior hip    Passive ROM Rt hip IR/ER hip extended knee flexed pt prone    Kinesiotex --   1 strip etither side of spine to  promote neutral spine posture especially for sitting and sit to stand                      PT Long Term Goals - 06/21/20 1304      PT LONG TERM GOAL #1   Title Decrease pain allowing patient to transfer form sit to stand and walk for functional distances with minimal pain to no or discomfort.    Time 6    Period Weeks    Status New    Target Date 08/02/20      PT LONG TERM GOAL #2   Title Improve core strength and stability allowing patient to preform lifting and bending activities associated with ADL's and work activities with minimal to no pain or discomfort    Time 6    Period Weeks    Status New    Target Date 08/02/20      PT LONG TERM GOAL #3   Title Patient to verbalize and demonstrate proper body mechanics for transfers, lifting, bending    Time 6    Period Weeks    Status New    Target Date 08/02/20      PT LONG TERM GOAL #4   Title Independent in HEP    Time 6    Period Weeks    Status New    Target Date 08/02/20       PT LONG TERM GOAL #5   Title Improve functional limitation score to 71    Time 6    Period Weeks    Status New    Target Date 08/02/20                 Plan - 07/15/20 1240    Clinical Impression Statement Persistent pain which is almost constant in nature; greater with prolonged sitting and sit to stand. Note tightness in pelvic floor muscuature between ischial tuberosities and coccyx Lt > Rt; QL; Rt posterior hip glut min/med to piriformis. Good response to DN/manual work and self release with small ball. Trial of kinesotape to maintain more neutral spinal curve with sitting and sit to stand.    Rehab Potential Good    PT Frequency 2x / week    PT Duration 6 weeks    PT Treatment/Interventions ADLs/Self Care Home Management;Aquatic Therapy;Cryotherapy;Electrical Stimulation;Iontophoresis 4mg /ml Dexamethasone;Moist Heat;Ultrasound;Functional mobility training;Therapeutic activities;Therapeutic exercise;Neuromuscular re-education;Patient/family education;Manual techniques;Dry needling;Taping    PT Next Visit Plan progress with core stabilization; DN and manual work Rt biceps femoris / posterior hip musculature. Progress with core stabilization and add lifting as tolerated Assess response to pelvic floor release with ball and trial of kineso tape    PT Home Exercise Plan [email or web address]    Consulted and Agree with Plan of Care Patient           Patient will benefit from skilled therapeutic intervention in order to improve the following deficits and impairments:     Visit Diagnosis: Radiculopathy, lumbar region  Other symptoms and signs involving the musculoskeletal system  Abnormal posture     Problem List Patient Active Problem List   Diagnosis Date Noted  . Lumbar degenerative disc disease 06/18/2020  . Great toe pain, right 05/21/2020  . Chronic neck pain 11/17/2019  . Myalgia 11/17/2019  . ADHD (attention deficit hyperactivity disorder), inattentive type 12/23/2017   . Skin nodule 02/10/2017  . Idiopathic scoliosis 01/22/2015  . Sacroiliac joint dysfunction of both sides 10/26/2014  . Adult ADHD 06/23/2014  . Anxiety 11/21/2013  .  Inattention 11/21/2013  . Hashimoto's thyroiditis 11/12/2013  . Acquired hypothyroidism 11/11/2013  . Multiple thyroid nodules 11/07/2013    Izaah Westman Rober Minion PT, MPH  07/15/2020, 12:55 PM  [facility] [address] [place], [place], [address] Phone: [phone]   Fax:  [phone]  Name: CHINENYE KATZENBERGER MRN: [id number] Date of Birth: 1985-10-10

## 2020-07-15 NOTE — Patient Instructions (Signed)

## 2020-07-20 ENCOUNTER — Encounter (INDEPENDENT_AMBULATORY_CARE_PROVIDER_SITE_OTHER): Payer: Managed Care, Other (non HMO)

## 2020-07-20 DIAGNOSIS — M5136 Other intervertebral disc degeneration, lumbar region: Secondary | ICD-10-CM | POA: Diagnosis not present

## 2020-07-20 DIAGNOSIS — M51369 Other intervertebral disc degeneration, lumbar region without mention of lumbar back pain or lower extremity pain: Secondary | ICD-10-CM

## 2020-07-20 MED ORDER — TRAMADOL HCL 50 MG PO TABS
50.0000 mg | ORAL_TABLET | Freq: Three times a day (TID) | ORAL | 0 refills | Status: DC | PRN
Start: 2020-07-20 — End: 2020-07-23

## 2020-07-20 MED ORDER — GABAPENTIN 300 MG PO CAPS: ORAL_CAPSULE | ORAL | 3 refills | Status: DC

## 2020-07-20 NOTE — Assessment & Plan Note (Signed)
Axial DDD with radicular pain now, worsened after a short period of improvement after conservative treatment. Proceeding with MRI, adding tramadol and gabapentin to hold her over in the meantime.

## 2020-07-20 NOTE — Telephone Encounter (Signed)
I spent 5 total minutes of online digital evaluation and management services. 

## 2020-07-21 NOTE — Telephone Encounter (Signed)
I suspect she just needs to wait a little longer before she hears about the MRI scheduling?

## 2020-07-22 ENCOUNTER — Telehealth: Payer: Self-pay | Admitting: General Practice

## 2020-07-22 MED ORDER — PREDNISONE 10 MG (48) PO TBPK: ORAL_TABLET | Freq: Every day | ORAL | 0 refills | Status: DC

## 2020-07-22 NOTE — Addendum Note (Signed)
Addended by: Monica Becton on: 07/22/2020 11:09 AM   Modules accepted: Orders

## 2020-07-22 NOTE — Telephone Encounter (Signed)
Transition Care Management Unsuccessful Follow-up Telephone Call  Date of discharge and from where:  [facility] 07/22/20  Attempts:  1st Attempt  Reason for unsuccessful TCM follow-up call:  Left voice message    

## 2020-07-23 ENCOUNTER — Encounter: Payer: Self-pay | Admitting: Podiatry

## 2020-07-23 ENCOUNTER — Other Ambulatory Visit: Payer: Self-pay

## 2020-07-23 ENCOUNTER — Ambulatory Visit (INDEPENDENT_AMBULATORY_CARE_PROVIDER_SITE_OTHER): Payer: Managed Care, Other (non HMO) | Admitting: Podiatry

## 2020-07-23 DIAGNOSIS — M7751 Other enthesopathy of right foot: Secondary | ICD-10-CM | POA: Diagnosis not present

## 2020-07-23 DIAGNOSIS — M5386 Other specified dorsopathies, lumbar region: Secondary | ICD-10-CM

## 2020-07-23 MED ORDER — TRAMADOL HCL 50 MG PO TABS: 50.0000 mg | ORAL_TABLET | Freq: Three times a day (TID) | ORAL | 0 refills | Status: DC | PRN

## 2020-07-23 NOTE — Progress Notes (Signed)
Subjective: 35 year old female presents the office today initially for follow-up evaluation of right foot pain.  In regards to that she states that she is feeling better she can wear inserts and she use Voltaren gel for about 2 weeks and overall her foot is doing better.  However since I last saw her she states that she is a flareup of sciatica and she has been in the emergency department.  She is awaiting MRI of her lumbar spine.  She is on tramadol every 6 hours and she is taking gabapentin 3 mg at nighttime.  She states she cannot feel her toes on her right foot and she is having pain in her entire leg on the right side.  Denies any systemic complaints such as fevers, chills, nausea, vomiting. No acute changes since last appointment, and no other complaints at this time.   Objective: AAO x3, NAD DP/PT pulses palpable bilaterally, CRT less than 3 seconds Difficult to evaluate the discomfort that she was having the right foot.  Imaging range of motion intact.  Sensation is decreased today with Semmes Weinstein monofilament in the right side.  No pain with calf compression, swelling, warmth, erythema  Assessment: Right leg sciatica versus lumbar spine pathology  Plan: -All treatment options discussed with the patient including all alternatives, risks, complications.  -She is follow-up with her primary care physician's office in regards to her back, leg pain.  Currently awaiting a MRI of her back.  She is taking gabapentin 3 mg at nighttime.  We discussed going ahead and increasing this to twice a day before going to 3 times a day.  Continue tramadol as prescribed.  Awaiting MRI of her spine. -Follow-up with her as needed but encouraged to call any questions or concerns. -Patient encouraged to call the office with any questions, concerns, change in symptoms.   Vivi Barrack DPM

## 2020-07-23 NOTE — Addendum Note (Signed)
Addended by: Monica Becton on: 07/23/2020 07:19 PM   Modules accepted: Orders

## 2020-07-26 ENCOUNTER — Other Ambulatory Visit: Payer: Self-pay

## 2020-07-26 ENCOUNTER — Encounter: Payer: Self-pay | Admitting: Rehabilitative and Restorative Service Providers"

## 2020-07-26 ENCOUNTER — Ambulatory Visit (INDEPENDENT_AMBULATORY_CARE_PROVIDER_SITE_OTHER): Payer: Managed Care, Other (non HMO) | Admitting: Rehabilitative and Restorative Service Providers"

## 2020-07-26 DIAGNOSIS — R29898 Other symptoms and signs involving the musculoskeletal system: Secondary | ICD-10-CM

## 2020-07-26 DIAGNOSIS — R293 Abnormal posture: Secondary | ICD-10-CM | POA: Diagnosis not present

## 2020-07-26 DIAGNOSIS — M5416 Radiculopathy, lumbar region: Secondary | ICD-10-CM | POA: Diagnosis not present

## 2020-07-26 NOTE — Telephone Encounter (Signed)
Transition Care Management Unsuccessful Follow-up Telephone Call  Date of discharge and from where:  [facility] 07/22/20  Attempts:  2nd Attempt  Reason for unsuccessful TCM follow-up call:  Left voice message

## 2020-07-26 NOTE — Therapy (Signed)
[facility] [place] [address] [place] [address] [place], [place], [address] Phone: [phone]   Fax:  [phone]  Physical Therapy Treatment  Patient Details  Name: Jocelyn Taylor MRN: [id number] Date of Birth: 10/26/1985 Referring Provider (PT): Dr Benjamin Stain   Encounter Date: 07/26/2020   PT End of Session - 07/26/20 1203    Visit Number 8    Number of Visits 12    Date for PT Re-Evaluation 08/02/20    PT Start Time 1148    PT Stop Time 1237    PT Time Calculation (min) 49 min    Activity Tolerance Patient tolerated treatment well           Past Medical History:  Diagnosis Date  . Goiter     Past Surgical History:  Procedure Laterality Date  . BREAST BIOPSY Right 06/2006  . TUBAL LIGATION Bilateral     There were no vitals filed for this visit.   Subjective Assessment - 07/26/20 1154    Subjective Patient reports that she started having increased radiating pain in the Rt LE further down into the leg. She began to increased pain in the LB. She feel and was unable to get out of the floor and ended up in the ER via ambulance. Patient has had continued increased pain in the Rt LE. She is awaiting insurance approval for MRI which has been denied to date. She fell again Friday, 07/21/20 and had continued increased pain. The pain in the Rt LE is significantly increased. Now having numbness in the Rt toes. Medication does not help that much.    Patient Stated Goals get rid of pain and move normally    Currently in Pain? Yes    Pain Score 7     Pain Location Back    Pain Orientation Right    Pain Descriptors / Indicators Sharp    Pain Type Chronic pain    Pain Radiating Towards Rt postrerior hip into the Rt LE to toes    Pain Onset More than a month ago    Pain Frequency Constant    Aggravating Factors  transitiion to standing after sitting; sitting    Pain Relieving Factors nothing              [facility] PT Assessment - 07/26/20  0001      Assessment   Medical Diagnosis Lumbar dysfunction    Referring Provider (PT) Dr Benjamin Stain    Onset Date/Surgical Date 06/04/20    Hand Dominance Right    Next MD Visit 6 weeks    Prior Therapy chiripractic care for ~ past 10 yrs ~ monthly      Posture/Postural Control   Posture Comments forward flexed posture improving posture follwoing treatment and with core engaged      AROM   Overall AROM Comments unable to assess trunk or LE mobility due to significant increase in pain      Palpation   Palpation comment increased muscular tightness in the Rt posterior hip through the piriformis; glut min/med into Rt hamstrings; Rt/Lt QL/lumbar paraspinals/lats                         [facility] Adult PT Treatment/Exercise - 07/26/20 0001      Lumbar Exercises: Stretches   Standing Extension 3 reps;5 seconds    Press Ups 10 reps   2-3 sec hold through limited range     Moist Heat Therapy   Number  Minutes Moist Heat 10 Minutes    Moist Heat Location Lumbar Spine;Hip      Manual Therapy   Manual therapy comments skilled palpation to assess response to DN and manual work    Joint Mobilization Greater trochanter PA mobs    Soft tissue mobilization posterior hip through the Rt piriformis and gluts; bilat lumbar musculature; Lt > Rt tissue btn ischial tuberosity to coccyx    Myofascial Release Rt posterior hip    Passive ROM Rt hip IR/ER hip extended knee flexed pt prone            Trigger Point Dry Needling - 07/26/20 0001    Consent Given? Yes    Education Handout Provided Previously provided    Dry Needling Comments Rt    Gluteus Minimus Response Palpable increased muscle length    Gluteus Medius Response Palpable increased muscle length    Gluteus Maximus Response Palpable increased muscle length    Piriformis Response Palpable increased muscle length    Quadratus Lumborum Response Palpable increased muscle length                     PT Long  Term Goals - 06/21/20 1304      PT LONG TERM GOAL #1   Title Decrease pain allowing patient to transfer form sit to stand and walk for functional distances with minimal pain to no or discomfort.    Time 6    Period Weeks    Status New    Target Date 08/02/20      PT LONG TERM GOAL #2   Title Improve core strength and stability allowing patient to preform lifting and bending activities associated with ADL's and work activities with minimal to no pain or discomfort    Time 6    Period Weeks    Status New    Target Date 08/02/20      PT LONG TERM GOAL #3   Title Patient to verbalize and demonstrate proper body mechanics for transfers, lifting, bending    Time 6    Period Weeks    Status New    Target Date 08/02/20      PT LONG TERM GOAL #4   Title Independent in HEP    Time 6    Period Weeks    Status New    Target Date 08/02/20      PT LONG TERM GOAL #5   Title Improve functional limitation score to 71    Time 6    Period Weeks    Status New    Target Date 08/02/20                 Plan - 07/26/20 1248    Clinical Impression Statement Patient returns with significant increase in Rt LB and posterior hip painand increased radicular Rt LE pain with pain extending into the Rt calf and foot. She now has numbness in the Rt toes. Patient is tearful as she relates events of the past week. She did not tolerate assessment of ROM; strength; LE mobility which were all deferred today. She did respond well to manual work, DN, prone exercises reporting decreased pain and demonstrating improved upright posture. Educated patient on positioning, lifting, back care. Suggested patient try purchasing a lumbar support to provide short term support and help manage symptoms. Patient is awaiting insurance approval of MRI.    Rehab Potential Good    PT Frequency 2x / week    PT Duration  6 weeks    PT Treatment/Interventions ADLs/Self Care Home Management;Aquatic Therapy;Cryotherapy;Electrical  Stimulation;Iontophoresis 4mg /ml Dexamethasone;Moist Heat;Ultrasound;Functional mobility training;Therapeutic activities;Therapeutic exercise;Neuromuscular re-education;Patient/family education;Manual techniques;Dry needling;Taping    PT Next Visit Plan treatment as pain allows - progress with core stabilization; DN and manual work Rt biceps femoris / posterior hip musculature. Progress with core stabilization and add lifting as tolerated; continue pelvic floor release with ball    PT Home Exercise Plan [id number]    Consulted and Agree with Plan of Care Patient           Patient will benefit from skilled therapeutic intervention in order to improve the following deficits and impairments:     Visit Diagnosis: Radiculopathy, lumbar region  Other symptoms and signs involving the musculoskeletal system  Abnormal posture     Problem List Patient Active Problem List   Diagnosis Date Noted  . Lumbar degenerative disc disease 06/18/2020  . Great toe pain, right 05/21/2020  . Chronic neck pain 11/17/2019  . Myalgia 11/17/2019  . Hemorrhoids 09/10/2018  . 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 Skin nodule 02/10/2017  . Idiopathic scoliosis 01/22/2015  . Sacroiliac joint dysfunction of both sides 10/26/2014  . Adult ADHD 06/23/2014  . Anxiety 11/21/2013  . Inattention 11/21/2013  . Hashimoto's thyroiditis 11/12/2013  . Acquired hypothyroidism 11/11/2013  . Multiple thyroid nodules 11/07/2013    Jocelyn Taylor 11/09/2013 PT, MPH  07/26/2020, 1:07 PM  [facility] [address] [place] [address], [place], [place] Phone: [phone]   Fax:  [phone]  Name: SARAHANNE NOVAKOWSKI MRN: Precious Bard Date of Birth: 22-Jul-1985

## 2020-07-27 ENCOUNTER — Telehealth: Payer: Self-pay | Admitting: Sports Medicine

## 2020-07-27 NOTE — Telephone Encounter (Signed)
Called the insurance line,.  Peer-to-peer scheduled for 07/28/2020 at 12:15 PM.

## 2020-07-27 NOTE — Telephone Encounter (Signed)
Transition Care Management Follow-up Telephone Call  Date of discharge and from where: 07/21/2020 from [facility]  How have you been since you were released from the hospital?Pt stated that she is in a lot of pain and is hoping that the MRI PA appeal is approved.   Any questions or concerns? No  Items Reviewed:  Did the pt receive and understand the discharge instructions provided? Yes   Medications obtained and verified? Yes   Other? No   Any new allergies since your discharge? No   Dietary orders reviewed? n/a  Do you have support at home? Yes   Functional Questionnaire: (I = Independent and D = Dependent) ADLs: I  Bathing/Dressing- I  Meal Prep- I  Eating- I  Maintaining continence- I  Transferring/Ambulation- D - pt is having difficulty walking and she is not able to feel her toes.   Managing Meds- I  Follow up appointments reviewed:   PCP Hospital f/u appt confirmed? No    Specialist Hospital f/u appt confirmed? No    Are transportation arrangements needed? No   If their condition worsens, is the pt aware to call PCP or go to the Emergency Dept.? Yes  Was the patient provided with contact information for the PCP's office or ED? Yes  Was to pt encouraged to call back with questions or concerns? Yes

## 2020-07-28 ENCOUNTER — Encounter: Payer: Self-pay | Admitting: Rehabilitative and Restorative Service Providers"

## 2020-07-28 ENCOUNTER — Ambulatory Visit (INDEPENDENT_AMBULATORY_CARE_PROVIDER_SITE_OTHER): Payer: Managed Care, Other (non HMO) | Admitting: Rehabilitative and Restorative Service Providers"

## 2020-07-28 ENCOUNTER — Encounter: Payer: Self-pay | Admitting: Podiatry

## 2020-07-28 DIAGNOSIS — M5416 Radiculopathy, lumbar region: Secondary | ICD-10-CM | POA: Diagnosis not present

## 2020-07-28 DIAGNOSIS — R29898 Other symptoms and signs involving the musculoskeletal system: Secondary | ICD-10-CM | POA: Diagnosis not present

## 2020-07-28 DIAGNOSIS — R293 Abnormal posture: Secondary | ICD-10-CM | POA: Diagnosis not present

## 2020-07-28 NOTE — Therapy (Signed)
[facility] [place] [address] [place], [place], [address] Phone: [phone]   Fax:  [phone]  Physical Therapy Treatment  Patient Details  Name: Jocelyn Taylor MRN: [id number] Date of Birth: April 25, 1985 Referring Provider (PT): Dr Benjamin Stain   Encounter Date: 07/28/2020   PT End of Session - 07/28/20 1110    Visit Number 9    Number of Visits 12    Date for PT Re-Evaluation 08/02/20    PT Start Time 1108    PT Stop Time 1156    PT Time Calculation (min) 48 min    Activity Tolerance Patient tolerated treatment well           Past Medical History:  Diagnosis Date  . Goiter     Past Surgical History:  Procedure Laterality Date  . BREAST BIOPSY Right 06/2006  . TUBAL LIGATION Bilateral     There were no vitals filed for this visit.   Subjective Assessment - 07/28/20 1111    Subjective Patient reports that the dry needling seemed to help - noticed less radicular pain into the Rt LE. She has been able to add some of the exercises as pain allows. MRI has been denied.    Currently in Pain? Yes    Pain Score 5     Pain Location Back    Pain Orientation Right    Pain Descriptors / Indicators Sharp;Tingling;Stabbing    Pain Type Chronic pain    Pain Onset More than a month ago    Pain Frequency Constant    Aggravating Factors  transition to standing after sitting; sitting    Pain Relieving Factors DN                             [facility] Adult PT Treatment/Exercise - 07/28/20 0001      Lumbar Exercises: Stretches   Standing Extension 3 reps;5 seconds    Press Ups 10 reps   2-3 sec hold through limited range   Gastroc Stretch Right;3 reps;20 seconds   standing - to ankle tolerance   Other Lumbar Stretch Exercise supine neural mobilization Rt LE 5 reps 2-3 sec hold      Moist Heat Therapy   Number Minutes Moist Heat 8 Minutes    Moist Heat Location Lumbar Spine;Hip      Cryotherapy   Number  Minutes Cryotherapy 8 Minutes    Cryotherapy Location Ankle    Type of Cryotherapy Ice pack      Manual Therapy   Manual therapy comments skilled palpation to assess response to DN and manual work    Joint Mobilization Greater trochanter PA mobs    Soft tissue mobilization posterior hip through the Rt piriformis and gluts; bilat lumbar musculature; Lt > Rt tissue btn ischial tuberosity to coccyx    Myofascial Release Rt posterior hip    Passive ROM Rt hip IR/ER hip extended knee flexed pt prone                       PT Long Term Goals - 06/21/20 1304      PT LONG TERM GOAL #1   Title Decrease pain allowing patient to transfer form sit to stand and walk for functional distances with minimal pain to no or discomfort.    Time 6    Period Weeks    Status New    Target Date 08/02/20  PT LONG TERM GOAL #2   Title Improve core strength and stability allowing patient to preform lifting and bending activities associated with ADL's and work activities with minimal to no pain or discomfort    Time 6    Period Weeks    Status New    Target Date 08/02/20      PT LONG TERM GOAL #3   Title Patient to verbalize and demonstrate proper body mechanics for transfers, lifting, bending    Time 6    Period Weeks    Status New    Target Date 08/02/20      PT LONG TERM GOAL #4   Title Independent in HEP    Time 6    Period Weeks    Status New    Target Date 08/02/20      PT LONG TERM GOAL #5   Title Improve functional limitation score to 71    Time 6    Period Weeks    Status New    Target Date 08/02/20                 Plan - 07/28/20 1141    Clinical Impression Statement Less painful today. Patient modified sleeping position to have LE elevated and upper body flat. Tried a lumbar support but is not crazy about it - does not seem to keep her from movingexcessively. She will try again. Patient tolerated some modified exercises today. Good response to DN/manual  therapy and extension exercises.    Rehab Potential Good    PT Frequency 2x / week    PT Duration 6 weeks    PT Treatment/Interventions ADLs/Self Care Home Management;Aquatic Therapy;Cryotherapy;Electrical Stimulation;Iontophoresis 4mg /ml Dexamethasone;Moist Heat;Ultrasound;Functional mobility training;Therapeutic activities;Therapeutic exercise;Neuromuscular re-education;Patient/family education;Manual techniques;Dry needling;Taping    PT Next Visit Plan treatment as pain allows - progress with core stabilization; DN and manual work Rt biceps femoris / posterior hip musculature. Progress with core stabilization and add lifting as tolerated; continue pelvic floor release with ball as tolerated - continue to work toward calming symptoms    PT Home Exercise Plan [id number]    Consulted and Agree with Plan of Care Patient           Patient will benefit from skilled therapeutic intervention in order to improve the following deficits and impairments:     Visit Diagnosis: Radiculopathy, lumbar region  Other symptoms and signs involving the musculoskeletal system  Abnormal posture     Problem List Patient Active Problem List   Diagnosis Date Noted  . Lumbar degenerative disc disease 06/18/2020  . Great toe pain, right 05/21/2020  . Chronic neck pain 11/17/2019  . Myalgia 11/17/2019  . Hemorrhoids 09/10/2018  . 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 Skin nodule 02/10/2017  . Idiopathic scoliosis 01/22/2015  . Sacroiliac joint dysfunction of both sides 10/26/2014  . Adult ADHD 06/23/2014  . Anxiety 11/21/2013  . Inattention 11/21/2013  . Hashimoto's thyroiditis 11/12/2013  . Acquired hypothyroidism 11/11/2013  . Multiple thyroid nodules 11/07/2013    Kharon Hixon 11/09/2013 PT, MPH  07/28/2020, 11:46 AM  [facility] [address] [place], [place], [place] Phone: [phone]   Fax:   [phone]  Name: Jocelyn Taylor MRN: Precious Bard Date of Birth: 11/06/1985

## 2020-07-28 NOTE — Telephone Encounter (Signed)
Auth approved, #[id number] exp 10/19/20.

## 2020-07-30 ENCOUNTER — Ambulatory Visit (INDEPENDENT_AMBULATORY_CARE_PROVIDER_SITE_OTHER): Payer: Managed Care, Other (non HMO)

## 2020-07-30 ENCOUNTER — Other Ambulatory Visit: Payer: Self-pay

## 2020-07-30 ENCOUNTER — Ambulatory Visit (INDEPENDENT_AMBULATORY_CARE_PROVIDER_SITE_OTHER): Payer: Managed Care, Other (non HMO) | Admitting: Podiatry

## 2020-07-30 DIAGNOSIS — S93401A Sprain of unspecified ligament of right ankle, initial encounter: Secondary | ICD-10-CM

## 2020-07-30 DIAGNOSIS — S99911A Unspecified injury of right ankle, initial encounter: Secondary | ICD-10-CM

## 2020-07-30 DIAGNOSIS — M5386 Other specified dorsopathies, lumbar region: Secondary | ICD-10-CM

## 2020-07-30 NOTE — Progress Notes (Signed)
Subjective: 35 year old female presents the office today for concerns of right ankle injury.  She states that after she saw me in the office last week a couple hours later she twisted her ankle she has difficulty feeling her foot.  She is noted swelling and pain immediately and over the last week she has been trying ice and take Tylenol and the swelling has not improved.  She did not wear flat shoe again given the swelling. Denies any systemic complaints such as fevers, chills, nausea, vomiting. No acute changes since last appointment, and no other complaints at this time.   Objective: AAO x3, NAD DP/PT pulses palpable bilaterally, CRT less than 3 seconds No specific area of pinpoint tenderness.  There is edema localized to the lateral aspect ankle and also to the dorsal foot.  There is no significant tenderness to the foot and no significant tenderness of the medial ankle.  The majority tenderness is on the lateral ankle complex.  There is an increase in anterior drawer compared to contra extremity and mild increase in talar tilt test.  No severe tenderness on the fibula itself.  No proximal tib-fib pain.  Achilles tendon intact.  Mild discomfort on the peroneal tendon on the right side.  Difficult to fully evaluate as she has numbness to her ankle and foot in general given her nerve issues. No pain with calf compression, swelling, warmth, erythema  Assessment: Severe ankle sprain right  Plan: -All treatment options discussed with the patient including all alternatives, risks, complications.  -X-rays obtained reviewed.  No definitive evidence of acute fracture. -Cam was dispensed for immobilization.  Discussed continue tramadol but anti-inflammatories, ibuprofen as needed as well.  Ice as well as well as elevation. -Discussed wearing shoe with a heel on the left side to help keep her even to help avoid any further excess pressure on her back, hip. -Patient encouraged to call the office with any  questions, concerns, change in symptoms.   Vivi Barrack DPM

## 2020-07-31 ENCOUNTER — Ambulatory Visit (INDEPENDENT_AMBULATORY_CARE_PROVIDER_SITE_OTHER): Payer: Managed Care, Other (non HMO)

## 2020-07-31 DIAGNOSIS — M5136 Other intervertebral disc degeneration, lumbar region: Secondary | ICD-10-CM

## 2020-07-31 DIAGNOSIS — M545 Low back pain, unspecified: Secondary | ICD-10-CM | POA: Diagnosis not present

## 2020-07-31 IMAGING — MR MR LUMBAR SPINE W/O CM
4 of 5 series · 26 of 48 positions shown · non-contrast
Comparison: None.

CLINICAL DATA: Low back pain with radiculopathy; technologist note
states pain radiating down right side

EXAM:
MRI LUMBAR SPINE WITHOUT CONTRAST
TECHNIQUE: Multiplanar, multisequence MR imaging of the lumbar spine was
performed. No intravenous contrast was administered.

[Series 2: T2 · sagittal · 4.0mm · 0.81mm/px · 6 of 15 slices shown (1 of 2)]
[im 1/15]
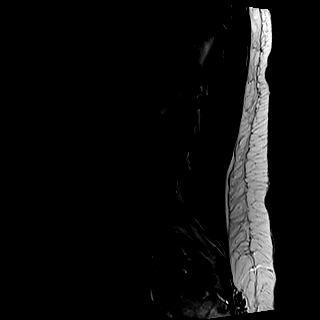
[im 3/15]
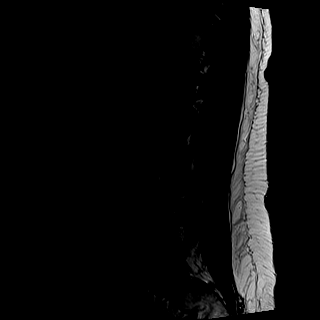
[im 6/15]
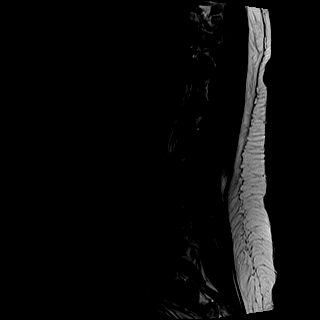
[im 9/15]
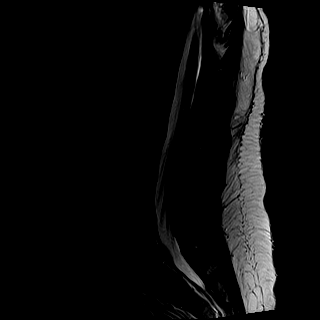
[im 12/15]
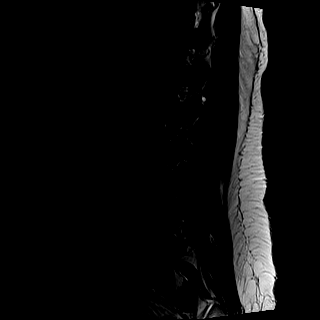
[im 15/15]
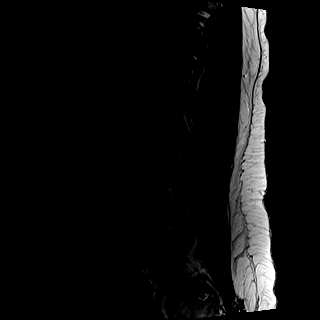

[Series 3: T1 · sagittal · 4.0mm · 0.41mm/px · 6 of 15 slices shown (1 of 2)]
[im 1/15]
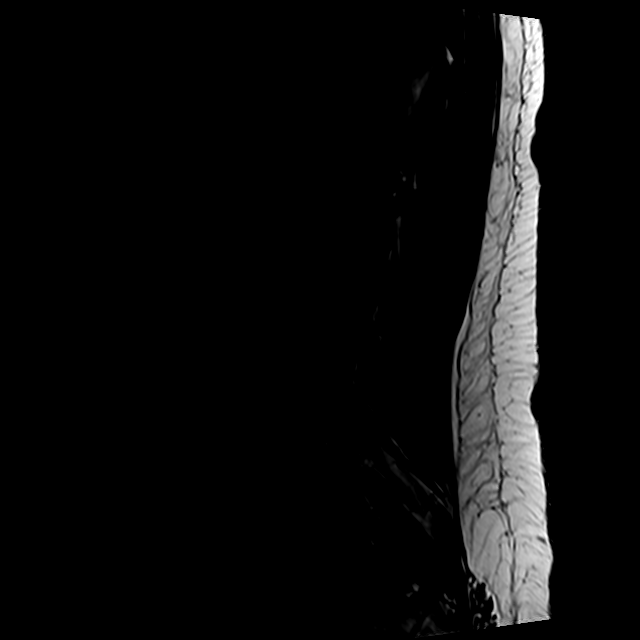
[im 3/15]
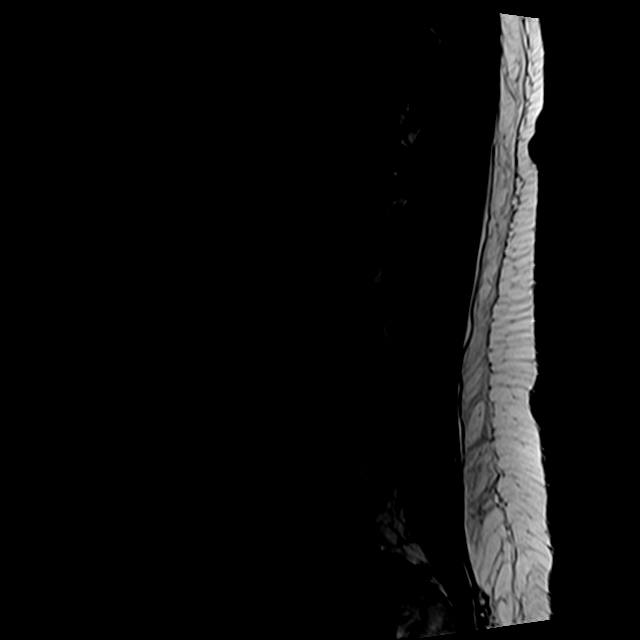
[im 6/15]
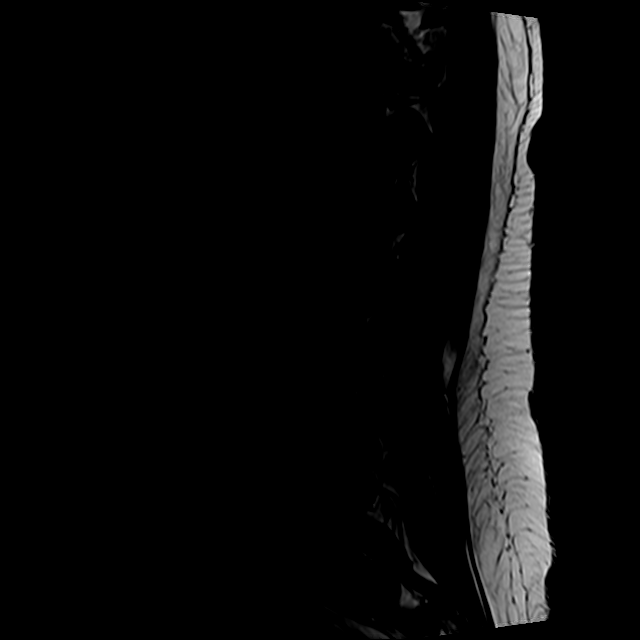
[im 9/15]
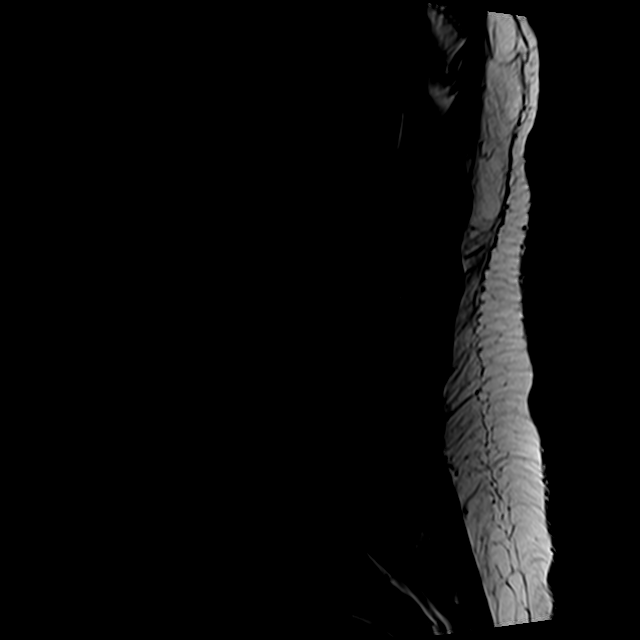
[im 12/15]
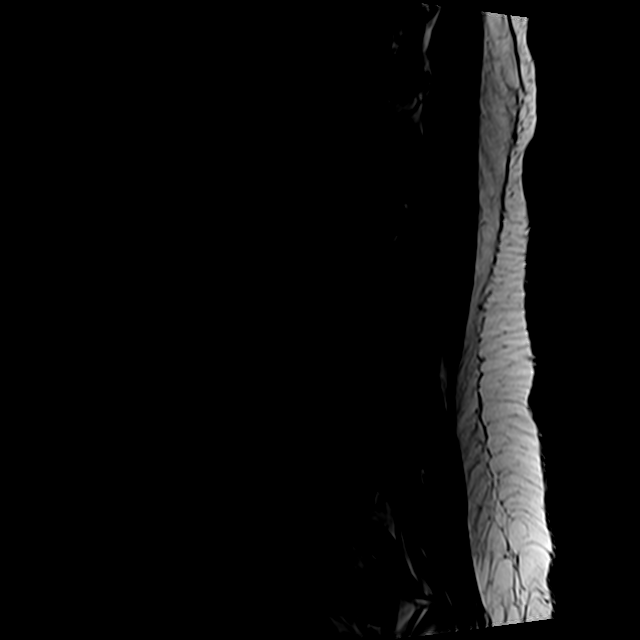
[im 15/15]
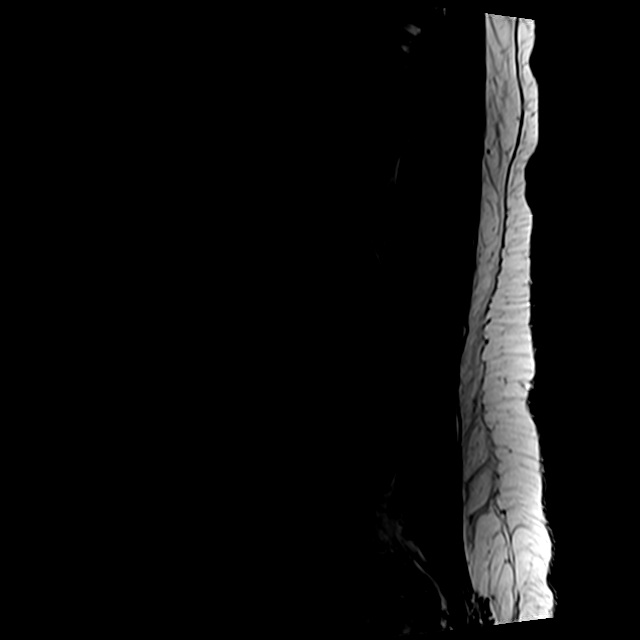

[Series 5: T2 · axial · 4.0mm · 0.78mm/px · z∈[-145,+62]mm · 9 of 37 slices shown (2 of 2)]
[im 1/37]
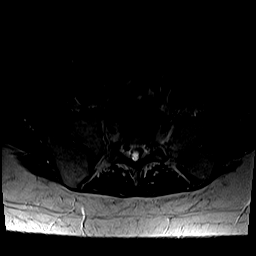
[im 6/37]
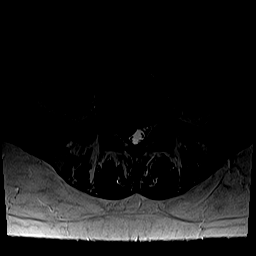
[im 11/37]
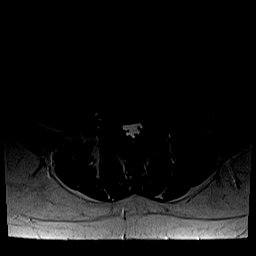
[im 16/37]
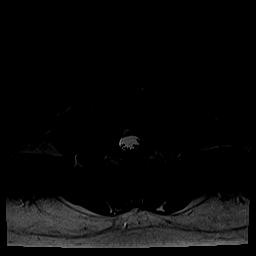
[im 19/37]
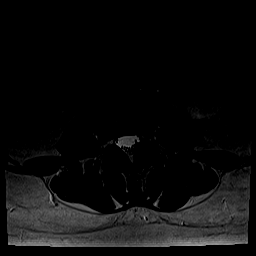
[im 21/37]
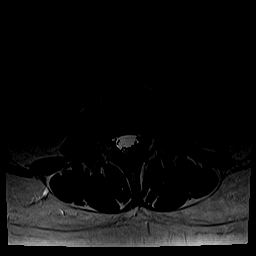
[im 26/37]
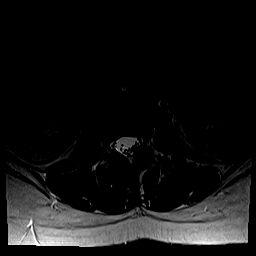
[im 31/37]
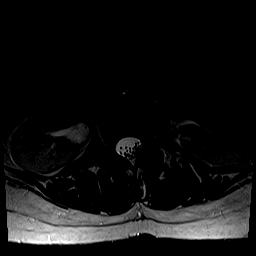
[im 37/37]
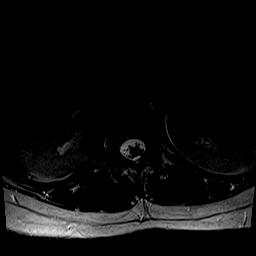

[Series 6: T1 · axial · 4.0mm · 0.39mm/px · z∈[-145,+33]mm · 5 of 37 slices shown (2 of 2)]
[im 1/37]
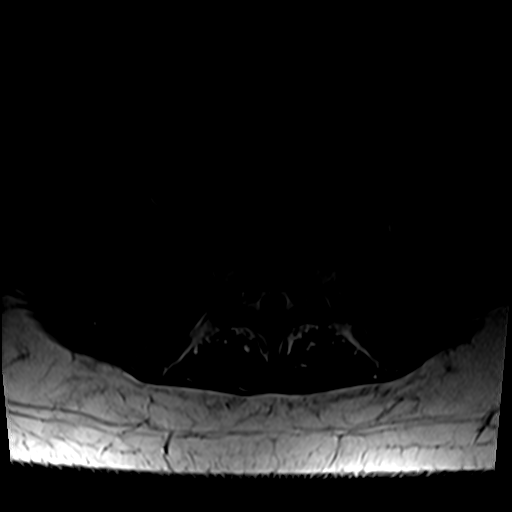
[im 6/37]
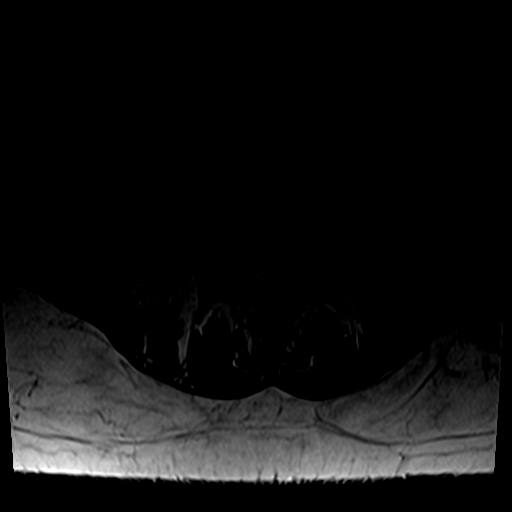
[im 11/37]
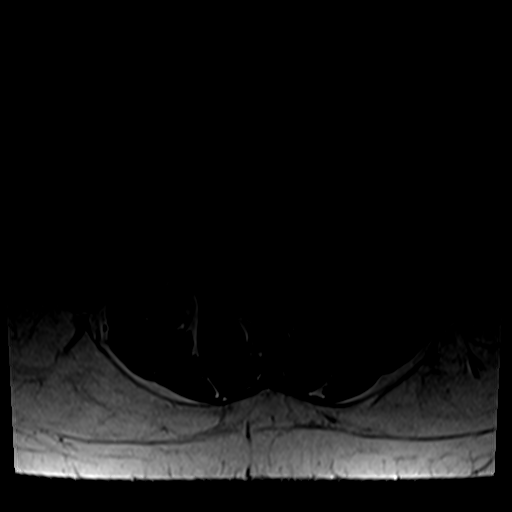
[im 19/37]
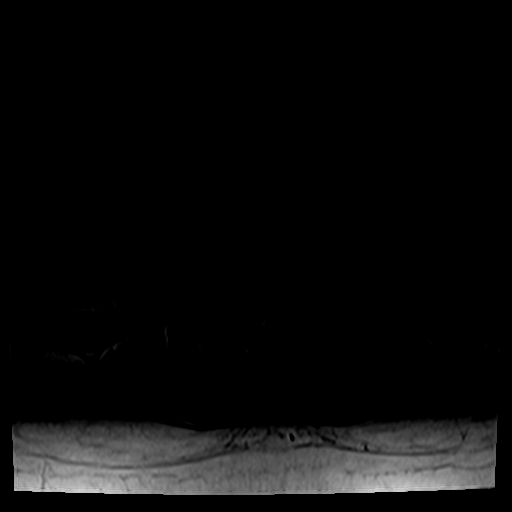
[im 31/37]
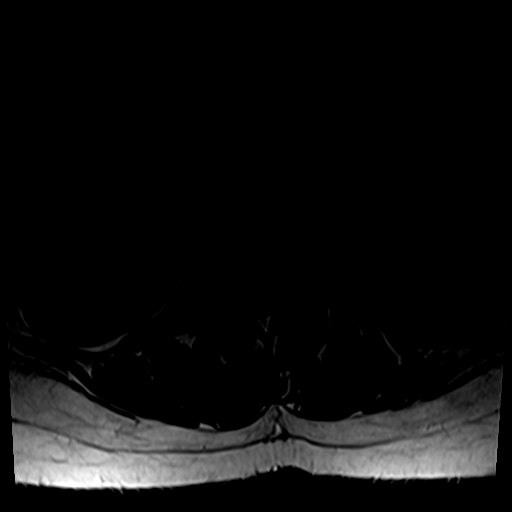

[26 of 48 positions shown; findings below may reference images not displayed]

FINDINGS: Segmentation:  Standard.

Alignment: Dextrocurvature. No significant anteroposterior
listhesis.

Vertebrae:  Vertebral body heights are maintained.

Conus medullaris and cauda equina: Conus extends to the L1-L2 level.
Conus and cauda equina appear normal.

Paraspinal and other soft tissues: Unremarkable.

Disc levels:

L1-L2:  No canal or foraminal stenosis.

L2-L3:  No canal or foraminal stenosis.

L3-L4:  No canal or foraminal stenosis.

L4-L5: Disc desiccation without height loss. Disc bulge. Mild facet
arthropathy. No canal or foraminal stenosis.

L5-S1: Disc bulge slightly eccentric to the right. Superimposed
right subarticular disc extrusion extending just above and below
disc level. No canal stenosis. Effacement of the right subarticular
recess with compression of the traversing S1 nerve roots. Mild right
foraminal stenosis. No left foraminal stenosis.
IMPRESSION: Lower lumbar degenerative changes as detailed above. Most notably, a
disc herniation at L5-S1 compresses the traversing right S1 nerve
roots.

## 2020-08-02 ENCOUNTER — Telehealth: Payer: Self-pay | Admitting: Podiatry

## 2020-08-02 ENCOUNTER — Other Ambulatory Visit: Payer: Self-pay | Admitting: Physician Assistant

## 2020-08-02 ENCOUNTER — Other Ambulatory Visit: Payer: Self-pay

## 2020-08-02 DIAGNOSIS — M5136 Other intervertebral disc degeneration, lumbar region: Secondary | ICD-10-CM

## 2020-08-02 NOTE — Telephone Encounter (Signed)
I misunderstood pt and she is not coming to [place] but would like you to contact [place]  to see if she could pick it up there tomorrow. Please let pt know.

## 2020-08-02 NOTE — Telephone Encounter (Signed)
Pt is coming to pick up a meduim boot, since there was not one in [place] on Friday. Can someone put one up front for her.

## 2020-08-02 NOTE — Telephone Encounter (Signed)
Pt left message this morning @1158  asking if we had gotten the brace in that we did not have in her size on Friday in [place] office. She would like to pick up in [place] if possible.

## 2020-08-03 ENCOUNTER — Telehealth: Payer: Self-pay | Admitting: Podiatry

## 2020-08-03 ENCOUNTER — Ambulatory Visit (INDEPENDENT_AMBULATORY_CARE_PROVIDER_SITE_OTHER): Payer: Managed Care, Other (non HMO) | Admitting: Sports Medicine

## 2020-08-03 ENCOUNTER — Other Ambulatory Visit: Payer: Self-pay

## 2020-08-03 DIAGNOSIS — M51369 Other intervertebral disc degeneration, lumbar region without mention of lumbar back pain or lower extremity pain: Secondary | ICD-10-CM

## 2020-08-03 DIAGNOSIS — M5136 Other intervertebral disc degeneration, lumbar region: Secondary | ICD-10-CM

## 2020-08-03 MED ORDER — METHYLPHENIDATE HCL ER (OSM) 27 MG PO TBCR: 27.0000 mg | EXTENDED_RELEASE_TABLET | ORAL | 0 refills | Status: DC

## 2020-08-03 NOTE — Assessment & Plan Note (Addendum)
This is a pleasant 35 year old female, she has had low back pain with right-sided radiculopathy in an S1 distribution for some time now, at this point she has failed physical therapy, steroids, analgesics. We obtained an MRI that showed a fairly large L5-S1 disc extrusion with compression of the right S1 nerve root. Due to failure of conservative treatment we are going to proceed with selective right S1 epidural, I would also like a second opinion from Dr. Yevette Edwards considering the size of this disc protrusion. Return to see me 1 month after the epidural injection.  Of note she is having some increasing back and hip pain as she is in a boot on the right side postintervention by podiatry, I think some of her problem is due to a iatrogenic leg length discrepancy, we will add a heel lift to the left side to try and offset this.

## 2020-08-03 NOTE — Progress Notes (Signed)
    Procedures performed today:    None.  Independent interpretation of notes and tests performed by another provider:   MRI personally reviewed, there is a good sized L5-S1 disc extrusion compressing the right S1 nerve.  Brief History, Exam, Impression, and Recommendations:    Lumbar degenerative disc disease This is a pleasant 35 year old female, she has had low back pain with right-sided radiculopathy in an S1 distribution for some time now, at this point she has failed physical therapy, steroids, analgesics. We obtained an MRI that showed a fairly large L5-S1 disc extrusion with compression of the right S1 nerve root. Due to failure of conservative treatment we are going to proceed with selective right S1 epidural, I would also like a second opinion from Dr. Yevette Edwards considering the size of this disc protrusion. Return to see me 1 month after the epidural injection.  Of note she is having some increasing back and hip pain as she is in a boot on the right side postintervention by podiatry, I think some of her problem is due to a iatrogenic leg length discrepancy, we will add a heel lift to the left side to try and offset this.    ___________________________________________ Ihor Austin. Benjamin Stain, M.D., ABFM., CAQSM. Primary Care and Sports Medicine [facility]  Adjunct Instructor of Family Medicine  [employer]

## 2020-08-03 NOTE — Telephone Encounter (Signed)
Patient would lie to come and pick up size medium ankle brace. Patient states she is having to drive here after work, and will get here at closing 5pm. Please advise.

## 2020-08-04 ENCOUNTER — Encounter: Payer: Self-pay | Admitting: Rehabilitative and Restorative Service Providers"

## 2020-08-04 ENCOUNTER — Ambulatory Visit (INDEPENDENT_AMBULATORY_CARE_PROVIDER_SITE_OTHER): Payer: Managed Care, Other (non HMO) | Admitting: Rehabilitative and Restorative Service Providers"

## 2020-08-04 DIAGNOSIS — R29898 Other symptoms and signs involving the musculoskeletal system: Secondary | ICD-10-CM

## 2020-08-04 DIAGNOSIS — M5416 Radiculopathy, lumbar region: Secondary | ICD-10-CM

## 2020-08-04 DIAGNOSIS — R293 Abnormal posture: Secondary | ICD-10-CM

## 2020-08-04 NOTE — Telephone Encounter (Signed)
Called and spoke with the patient and patient has already got the boot. Misty Stanley

## 2020-08-04 NOTE — Therapy (Signed)
[facility] [place] [address] [place], [place], [address] Phone: [phone]   Fax:  [phone]  Physical Therapy Treatment  Patient Details  Name: Jocelyn Taylor MRN: [id number] Date of Birth: 03/23/1985 Referring Provider (PT): Dr Benjamin Stain   Encounter Date: 08/04/2020   PT End of Session - 08/04/20 1155     Visit Number 10    Number of Visits 12    Date for PT Re-Evaluation 08/02/20    PT Start Time 1148    PT Stop Time 1236    PT Time Calculation (min) 48 min    Activity Tolerance Patient tolerated treatment well             Past Medical History:  Diagnosis Date   Goiter     Past Surgical History:  Procedure Laterality Date   BREAST BIOPSY Right 06/2006   TUBAL LIGATION Bilateral     There were no vitals filed for this visit.   Subjective Assessment - 08/04/20 1157     Subjective MRI showed large disc protrusion. She is scheduled for [facility] 08/06/20. Pain is not as bad as is was but she is still having pain in the LB and into the Rt LE. She does have Rt ankle sprain and is in a boot for 2 weeks. Swelling in ankle has decreased. She is trying to level up the Lt foot when walking or standing.    Currently in Pain? Yes    Pain Score 4     Pain Location Back    Pain Orientation Right    Pain Descriptors / Indicators Clance Boll PT Assessment - 08/04/20 0001       Assessment   Medical Diagnosis Lumbar dysfunction    Referring Provider (PT) Dr Benjamin Stain    Onset Date/Surgical Date 06/04/20    Hand Dominance Right    Next MD Visit 6 weeks    Prior Therapy chiripractic care for ~ past 10 yrs ~ monthly      AROM   Overall AROM Comments lumbar mobility/ROM continues to be limited throughout with pain      Flexibility   Hamstrings tight Rt ~ 50 deg with pain into Rt buttock; Lt ~ 70 deg    Quadriceps tight Lt > Rt    Piriformis tight Rt      Palpation   Palpation comment  some improvement but persistent muscular tightness in the Rt posterior hip through the piriformis; glut min/med into Rt hamstrings; Rt/Lt QL/lumbar paraspinals/lats      Ambulation/Gait   Gait Comments ambulates with walking boot Rt LE due to ankle sprain will be in walking boot x 2 weeks                           [facility] Adult PT Treatment/Exercise - 08/04/20 0001       Lumbar Exercises: Stretches   Standing Extension 3 reps;5 seconds    Press Ups 10 reps   2-3 sec hold through limited range note increasing range   Other Lumbar Stretch Exercise supine neural mobilization Rt LE 5 reps 2-3 sec hold      Lumbar Exercises: Standing   Wall Slides 10 reps;3 seconds   push pull 10# KB chest level     Lumbar Exercises: Seated   Sit to Stand 10 reps   core engaged holding 10# KB  Moist Heat Therapy   Number Minutes Moist Heat 10 Minutes    Moist Heat Location Lumbar Spine;Hip      Manual Therapy   Manual therapy comments skilled palpation to assess response to DN and manual work    Joint Mobilization Greater trochanter PA mobs    Soft tissue mobilization posterior hip through the Rt piriformis and gluts; bilat lumbar musculature; Lt > Rt tissue btn ischial tuberosity to coccyx    Myofascial Release Rt posterior hip    Passive ROM Rt hip IR/ER hip extended knee flexed pt prone; knee flexion to stretch quads 20-30 sec x 2 ea side              Trigger Point Dry Needling - 08/04/20 0001     Consent Given? Yes    Education Handout Provided Previously provided    Dry Needling Comments Rt    Gluteus Minimus Response Palpable increased muscle length    Gluteus Medius Response Palpable increased muscle length    Gluteus Maximus Response Palpable increased muscle length    Piriformis Response Palpable increased muscle length    Lumbar multifidi Response Palpable increased muscle length    Quadratus Lumborum Response Palpable increased muscle length                        PT Long Term Goals - 06/21/20 1304       PT LONG TERM GOAL #1   Title Decrease pain allowing patient to transfer form sit to stand and walk for functional distances with minimal pain to no or discomfort.    Time 6    Period Weeks    Status New    Target Date 08/02/20      PT LONG TERM GOAL #2   Title Improve core strength and stability allowing patient to preform lifting and bending activities associated with ADL's and work activities with minimal to no pain or discomfort    Time 6    Period Weeks    Status New    Target Date 08/02/20      PT LONG TERM GOAL #3   Title Patient to verbalize and demonstrate proper body mechanics for transfers, lifting, bending    Time 6    Period Weeks    Status New    Target Date 08/02/20      PT LONG TERM GOAL #4   Title Independent in HEP    Time 6    Period Weeks    Status New    Target Date 08/02/20      PT LONG TERM GOAL #5   Title Improve functional limitation score to 71    Time 6    Period Weeks    Status New    Target Date 08/02/20                   Plan - 08/04/20 1254     Clinical Impression Statement Patient presents with walking boot Rt LE. Dr Ardelle Anton evaluated ankle and diagnosed torn ligaments. She will be in walking boot x 2 weeks. Pt reports that ankle pain and edema have decreased since she has been in the boot. LBP and Rt LE radicular pain have both decreased. She has lumbar HNP as noted - Lower lumbar degenerative changes. Most notably, a  disc herniation at L5-S1 compresses the traversing right S1 nerve roots per radologist report. Patient is scheduled for [facility] Friday, 08/06/20. We will hold PT until the  following week and resume treatment to progress with core strengthening and stabilization.    Rehab Potential Good    PT Frequency 2x / week    PT Duration 6 weeks    PT Treatment/Interventions ADLs/Self Care Home Management;Aquatic Therapy;Cryotherapy;Electrical  Stimulation;Iontophoresis 4mg /ml Dexamethasone;Moist Heat;Ultrasound;Functional mobility training;Therapeutic activities;Therapeutic exercise;Neuromuscular re-education;Patient/family education;Manual techniques;Dry needling;Taping    PT Next Visit Plan treatment as pain allows - progress with core stabilization; DN and manual work Rt biceps femoris / posterior hip musculature. Progress with core stabilization and add lifting as tolerated; continue pelvic floor release with ball as tolerated - continue to work toward calming symptoms    PT Home Exercise Plan [email or web address]    Consulted and Agree with Plan of Care Patient             Patient will benefit from skilled therapeutic intervention in order to improve the following deficits and impairments:     Visit Diagnosis: Radiculopathy, lumbar region  Other symptoms and signs involving the musculoskeletal system  Abnormal posture     Problem List Patient Active Problem List   Diagnosis Date Noted   Lumbar degenerative disc disease 06/18/2020   Great toe pain, right 05/21/2020   Chronic neck pain 11/17/2019   Myalgia 11/17/2019   Hemorrhoids 09/10/2018   ADHD (attention deficit hyperactivity disorder), 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 [place], [place], [place] Phone: [phone]   Fax:  [phone]  Name: JAZMYNE BEAUCHESNE MRN: Precious Bard Date of Birth: 1985/12/13

## 2020-08-06 ENCOUNTER — Other Ambulatory Visit: Payer: Managed Care, Other (non HMO)

## 2020-08-06 ENCOUNTER — Other Ambulatory Visit: Payer: Self-pay

## 2020-08-06 ENCOUNTER — Ambulatory Visit
Admission: RE | Admit: 2020-08-06 | Discharge: 2020-08-06 | Disposition: A | Discharge status: Home / Self Care | Payer: Managed Care, Other (non HMO) | Source: Ambulatory Visit | Attending: Sports Medicine | Admitting: Sports Medicine

## 2020-08-06 ENCOUNTER — Encounter: Payer: Managed Care, Other (non HMO) | Admitting: Rehabilitative and Restorative Service Providers"

## 2020-08-06 DIAGNOSIS — M5136 Other intervertebral disc degeneration, lumbar region: Secondary | ICD-10-CM

## 2020-08-06 IMAGING — XA DG EPIDURAL NERVE ROOT
2 series · 2 of 2 positions shown · non-contrast
Comparison: none

CLINICAL DATA: Lumbosacral spondylosis without myelopathy. Right
leg pain and numbness. Disc extrusion at L5-S1 with S1 nerve root
compression.

[Series 1: ortho standard · 1 of 1 slices shown (1 of 2)]
[im 1/1]
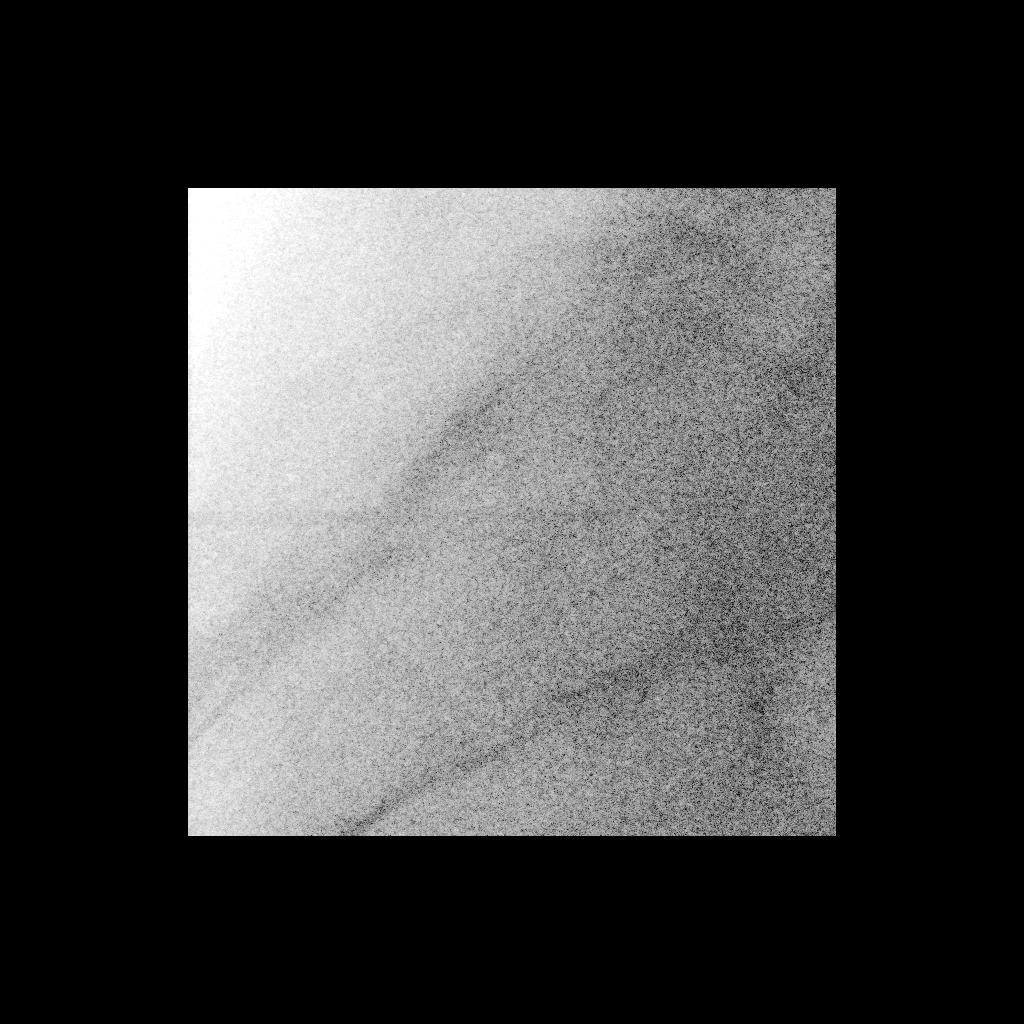

[Series 2: ortho standard · 1 of 1 slices shown (2 of 2)]
[im 1/1]
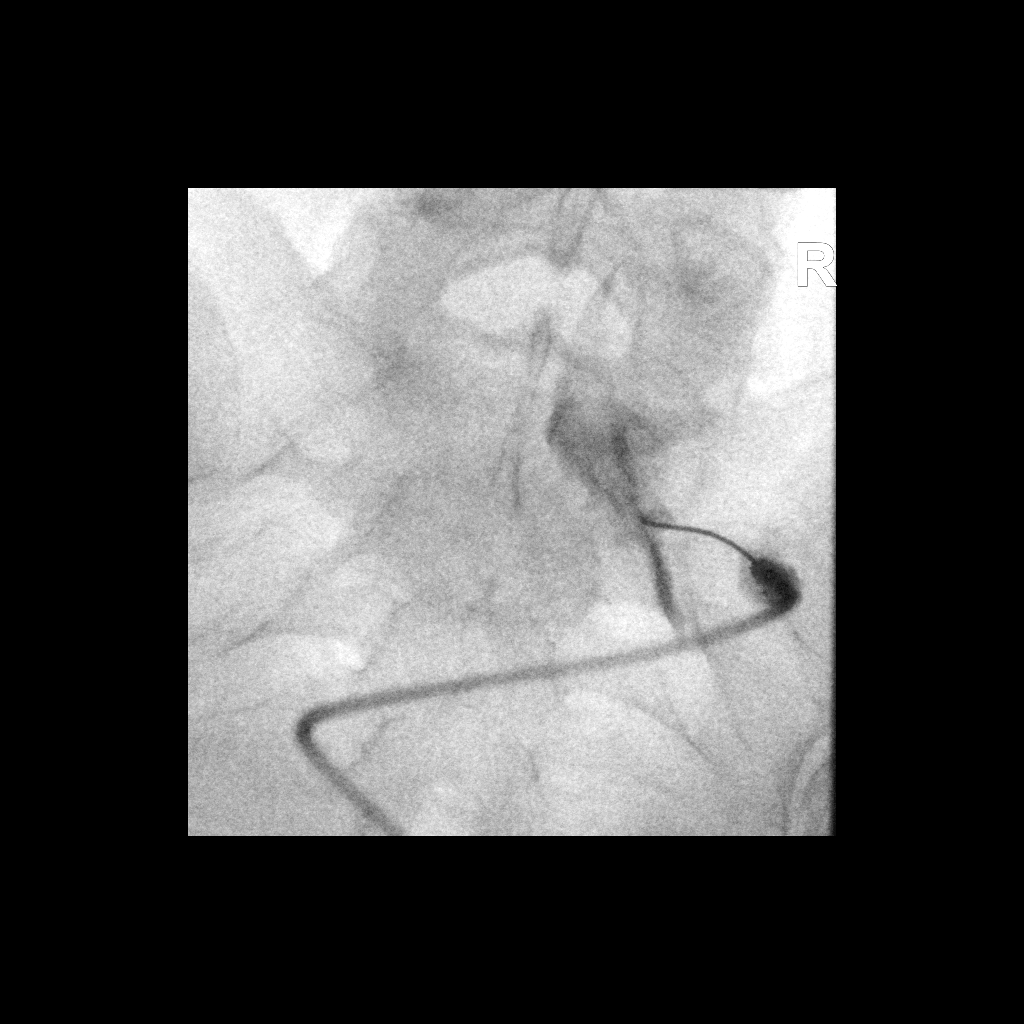

[2 of 2 positions shown; findings below may reference images not displayed]

EXAM:
EPIDURAL/NERVE ROOT

FLUOROSCOPY TIME:  Fluoroscopy Time: 17 seconds

Radiation Exposure Index: 22.63 microGray*m^2

PROCEDURE:
The procedure, risks, benefits, and alternatives were explained to
the patient. Questions regarding the procedure were encouraged and
answered. The patient understands and consents to the procedure.

RIGHT S1 NERVE ROOT BLOCK AND TRANSFORAMINAL EPIDURAL: A posterior
oblique approach was taken to the intervertebral foramen on the
right at the S1 level using a curved 3.5 inch 22 gauge spinal
needle. Injection of Isovue-M 200 outlined the right S1 nerve root
and showed good epidural spread. No vascular opacification is seen.
80 mg of Depo-Medrol mixed with 2 mL of 1% lidocaine were instilled.
The procedure was well-tolerated, and the patient was discharged
thirty minutes following the injection in good condition.

COMPLICATIONS:
None
IMPRESSION: Technically successful injection consisting of a right S1 nerve root
block and transforaminal epidural.

## 2020-08-06 MED ORDER — IOPAMIDOL (ISOVUE-M 200) INJECTION 41%
1.0000 mL | Freq: Once | INTRAMUSCULAR | Status: AC
  Administered 2020-08-06: 1 mL via EPIDURAL

## 2020-08-06 MED ORDER — METHYLPREDNISOLONE ACETATE 40 MG/ML INJ SUSP (RADIOLOG
80.0000 mg | Freq: Once | INTRAMUSCULAR | Status: AC
  Administered 2020-08-06: 80 mg via EPIDURAL

## 2020-08-06 NOTE — Discharge Instructions (Signed)

## 2020-08-13 ENCOUNTER — Ambulatory Visit (INDEPENDENT_AMBULATORY_CARE_PROVIDER_SITE_OTHER): Payer: Managed Care, Other (non HMO) | Admitting: Rehabilitative and Restorative Service Providers"

## 2020-08-13 ENCOUNTER — Encounter: Payer: Self-pay | Admitting: Podiatry

## 2020-08-13 ENCOUNTER — Encounter: Payer: Self-pay | Admitting: Rehabilitative and Restorative Service Providers"

## 2020-08-13 ENCOUNTER — Ambulatory Visit (INDEPENDENT_AMBULATORY_CARE_PROVIDER_SITE_OTHER): Payer: Managed Care, Other (non HMO) | Admitting: Podiatry

## 2020-08-13 ENCOUNTER — Other Ambulatory Visit: Payer: Self-pay

## 2020-08-13 DIAGNOSIS — R29898 Other symptoms and signs involving the musculoskeletal system: Secondary | ICD-10-CM

## 2020-08-13 DIAGNOSIS — S93401D Sprain of unspecified ligament of right ankle, subsequent encounter: Secondary | ICD-10-CM

## 2020-08-13 DIAGNOSIS — R293 Abnormal posture: Secondary | ICD-10-CM

## 2020-08-13 DIAGNOSIS — M5416 Radiculopathy, lumbar region: Secondary | ICD-10-CM | POA: Diagnosis not present

## 2020-08-13 NOTE — Patient Instructions (Signed)
Access Code: [email or web address]: [email or web address]: 06/24/2022Prepared by: Shawndale Kilpatrick HoltExercises  Prone Press Up - 2 x daily - 7 x weekly - 1 sets - 10 reps - 2-3 sec hold  Supine Piriformis Stretch with Leg Straight - 2 x daily - 7 x weekly - 1 sets - 3 reps - 30 sec hold  Supine Sciatic Nerve Glide - 2 x daily - 7 x weekly - 1 sets - 8-10 reps - 1-2 sec hold  Supine Transversus Abdominis Bracing with Pelvic Floor Contraction - 2 x daily - 7 x weekly - 1 sets - 10 reps - 10sec hold  Hooklying Isometric Clamshell - 2 x daily - 7 x weekly - 1 sets - 10 reps - 3 sec hold  Sit to Stand - 2 x daily - 7 x weekly - 1 sets - 10 reps - 3-5 sec hold  Wall Quarter Squat - 2 x daily - 7 x weekly - 1-2 sets - 10 reps - 5-10 sec hold  Supine Bridge with Resistance Band - 2 x daily - 7 x weekly - 1-2 sets - 10 reps - 5-10 sec hold  Standing Lumbar Extension - 2 x daily - 7 x weekly - 1 sets - 2-3 reps - 2-3 sec hold  Standing Bilateral Low Shoulder Row with Anchored Resistance - 2 x daily - 7 x weekly - 1-3 sets - 10 reps - 2-3 sec hold  Shoulder Extension with Resistance - 2 x daily - 7 x weekly - 1-3 sets - 10 reps - 2-3 sec hold  Anti-Rotation Lateral Stepping with Press - 2 x daily - 7 x weekly - 1-2 sets - 10 reps - 2-3 sec hold  Plank on Counter - 2 x daily - 7 x weekly - 1 sets - 3 reps - 60 sec hold  Standing Lat Pull Down with Resistance - Elbows Bent - 2 x daily - 7 x weekly - 1 sets - 10 reps - 3 sec hold  Seated Ankle Circles - 1 x daily - 7 x weekly - 10 reps - 3 sets  Seated Ankle Alphabet - 3 x daily - 7 x weekly - 5 reps - 1 sets  Seated Heel Raise - 3 x daily - 7 x weekly - 10 reps - 1 sets  Seated Toe Raise - 3 x daily - 7 x weekly - 10 reps - 1 sets  Ankle Dorsiflexion with Resistance - 2 x daily - 7 x weekly - 1 sets - 3 reps - 30 sec hold  Ankle Eversion with Resistance - 2 x daily - 7 x weekly - 1 sets - 3 reps - 30 sec hold  Ankle Inversion with Resistance - 2 x daily - 7 x  weekly - 1 sets - 3 reps - 30 sec hold  Ankle and Toe Plantarflexion with Resistance - 2 x daily - 7 x weekly - 1 sets - 3 reps - 30 sec hold

## 2020-08-13 NOTE — Therapy (Signed)
[facility] [place] [address] [place], [place], [address] Phone: [phone]   Fax:  [phone]  Physical Therapy Treatment  Patient Details  Name: Jocelyn Taylor MRN: [id number] Date of Birth: [birth year] Referring Provider (PT): Dr Benjamin Stain   Encounter Date: 08/13/2020   PT End of Session - 08/13/20 1235     Visit Number 11    Number of Visits 24    Date for PT Re-Evaluation 09/24/20    PT Start Time 1148    PT Stop Time 1230    PT Time Calculation (min) 42 min    Activity Tolerance Patient tolerated treatment well             Past Medical History:  Diagnosis Date   Goiter     Past Surgical History:  Procedure Laterality Date   BREAST BIOPSY Right 06/2006   TUBAL LIGATION Bilateral     There were no vitals filed for this visit.   Subjective Assessment - 08/13/20 1232     Subjective Patient reports that the ESI went well last week. She has noted some improvement over the past several days. Symptoms are 50-60% improved. She has started with her previous exercises again. Patient sees neurosurgeon monday and reports that she is ready for surgery - just wants to feel better and "get it over with".    Currently in Pain? Yes    Pain Score 3     Pain Location Back    Pain Orientation Right    Pain Descriptors / Indicators Aching;Tightness;Spasm    Pain Type Chronic pain    Pain Radiating Towards Rt posterior hip to thigh    Pain Onset More than a month ago    Pain Frequency Constant    Aggravating Factors  sitting; moving sit to stand    Pain Relieving Factors ESI, DN                [facility] PT Assessment - 08/13/20 0001       Assessment   Medical Diagnosis Lumbar dysfunction    Referring Provider (PT) Dr Benjamin Stain    Onset Date/Surgical Date 06/04/20    Hand Dominance Right    Prior Therapy chiripractic care for ~ past 10 yrs ~ monthly      Posture/Postural Control   Posture Comments  improving posture and alignment in standing      AROM   Overall AROM Comments lumbar mobility/ROM continues to be limited throughout with pain    Lumbar Flexion 50% painful    Lumbar Extension 60% no pain with prone press up    Lumbar - Right Side Bend 70%    Lumbar - Left Side Bend 70% mild pain    Lumbar - Right Rotation 70%    Lumbar - Left Rotation 70%      Flexibility   Hamstrings tight Rt ~ 55 deg with pain into Rt buttock; Lt ~ 70 deg    Quadriceps tight Lt > Rt    Piriformis tight Rt      Palpation   Palpation comment persistent muscular tightness in the Rt posterior hip through the piriformis; glut min/med into Rt hamstrings; Rt/Lt QL/lumbar paraspinals/lats      Ambulation/Gait   Gait Comments ambulates with walking boot Rt LE due to ankle sprain will be weaning from walking boot  [facility] Adult PT Treatment/Exercise - 08/13/20 0001       Lumbar Exercises: Stretches   Passive Hamstring Stretch Limitations stretch to nerve tension tolerance - improving mobility    Standing Extension 3 reps;5 seconds    Press Ups 10 reps   2-3 sec hold through limited range note increasing range repeated  following DN   Piriformis Stretch Right;3 reps;30 seconds   supine travel with strap   Other Lumbar Stretch Exercise supine neural mobilization Rt LE 5 reps 2-3 sec hold      Lumbar Exercises: Standing   Wall Slides 10 reps;3 seconds   push pull 10# KB chest level   Row Strengthening;Both;10 reps;Theraband    Theraband Level (Row) Level 4 (Blue)    Shoulder Extension Strengthening;Both;10 reps;Theraband    Theraband Level (Shoulder Extension) Level 4 (Blue)    Other Standing Lumbar Exercises antirotation green TB x 10 reps each side      Lumbar Exercises: Seated   Sit to Stand 10 reps   core engaged holding 10# KB     Manual Therapy   Manual therapy comments skilled palpation to assess response to DN and manual work    Joint Mobilization  Greater trochanter PA mobs    Soft tissue mobilization posterior hip through the Rt piriformis and gluts; bilat lumbar musculature; Lt > Rt tissue btn ischial tuberosity to coccyx    Myofascial Release Rt posterior hip    Passive ROM Rt hip IR/ER hip extended knee flexed pt prone; knee flexion to stretch quads 20-30 sec x 2 ea side      Ankle Exercises: Supine   T-Band Eversioin/inversion/DF/PF 5 reps x 3 sec hold yellow TB (issued yellow and red TB for home              Trigger Point Dry Needling - 08/13/20 0001     Consent Given? Yes    Education Handout Provided Previously provided    Dry Needling Comments Rt    Gluteus Minimus Response Palpable increased muscle length    Gluteus Medius Response Palpable increased muscle length    Gluteus Maximus Response Palpable increased muscle length    Piriformis Response Palpable increased muscle length                  PT Education - 08/13/20 1210     Education Details HEP added ankle exercises for HEP    Person(s) Educated Patient    Methods Explanation;Demonstration;Tactile cues;Verbal cues;Handout    Comprehension Verbalized understanding;Returned demonstration;Verbal cues required;Tactile cues required                 PT Long Term Goals - 08/13/20 1240       PT LONG TERM GOAL #1   Title Decrease pain allowing patient to transfer form sit to stand and walk for functional distances with minimal pain to no or discomfort.    Time 6    Period Weeks    Status On-going    Target Date 09/24/20      PT LONG TERM GOAL #2   Title Improve core strength and stability allowing patient to preform lifting and bending activities associated with ADL's and work activities with minimal to no pain or discomfort    Time 6    Period Weeks    Status On-going    Target Date 09/24/20      PT LONG TERM GOAL #3   Title Patient to verbalize and demonstrate proper body mechanics for transfers, lifting,  bending    Time 6    Period  Weeks    Status On-going    Target Date 09/24/20      PT LONG TERM GOAL #4   Title Independent in HEP    Time 6    Period Weeks    Status On-going    Target Date 09/24/20      PT LONG TERM GOAL #5   Title Improve functional limitation score to 71    Time 6    Period Weeks    Status On-going    Target Date 09/24/20                   Plan - 08/13/20 1236     Clinical Impression Statement Patient returns post ESI reporting ~ 50-60% improvement in LBP and Rt LE radicular pain. She has returned to previous exercises with minimal difficulty. Understands that she needs to work on core stabilization and strengthening. She was released by Dr Ardelle Anton and instructed in ankle exercises today per MD request. Patient continues to demonstrate decresaed turnk and LE mobility, ROM; muscular tightness through the Rt lumbar and posteriro hip musculature into the Rt hamstrings. Patient will benefit from conintued PT to address core stabilization and strengthening.    Rehab Potential Good    PT Frequency 2x / week    PT Duration 6 weeks    PT Treatment/Interventions ADLs/Self Care Home Management;Aquatic Therapy;Cryotherapy;Electrical Stimulation;Iontophoresis 4mg /ml Dexamethasone;Moist Heat;Ultrasound;Functional mobility training;Therapeutic activities;Therapeutic exercise;Neuromuscular re-education;Patient/family education;Manual techniques;Dry needling;Taping    PT Next Visit Plan treatment as pain allows - progress with core stabilization; DN and manual work Rt biceps femoris / posterior hip musculature. Progress with core stabilization and add lifting as tolerated; continue pelvic floor release with ball as tolerated    PT Home Exercise Plan [email or web address]    Consulted and Agree with Plan of Care Patient             Patient will benefit from skilled therapeutic intervention in order to improve the following deficits and impairments:     Visit Diagnosis: Radiculopathy, lumbar  region  Other symptoms and signs involving the musculoskeletal system  Abnormal posture     Problem List Patient Active Problem List   Diagnosis Date Noted   Lumbar degenerative disc disease 06/18/2020   Great toe pain, right 05/21/2020   Chronic neck pain 11/17/2019   Myalgia 11/17/2019   Hemorrhoids 09/10/2018   ADHD (attention deficit hyperactivity disorder), inattentive type 12/23/2017   Skin nodule 02/10/2017   Idiopathic scoliosis 01/22/2015   Sacroiliac joint dysfunction of both sides 10/26/2014   Adult ADHD 06/23/2014   Anxiety 11/21/2013   Inattention 11/21/2013   Hashimoto's thyroiditis 11/12/2013   Acquired hypothyroidism 11/11/2013   Multiple thyroid nodules 11/07/2013    Ronita Hargreaves 11/09/2013 PT, MPH  08/13/2020, 12:45 PM  [facility] [address] [place], [place], [place] Phone: [phone]   Fax:  [phone]  Name: LUPE HANDLEY MRN: Precious Bard Date of Birth: 1985-03-30

## 2020-08-13 NOTE — Progress Notes (Signed)
Subjective: 35 year old female presents the office today for follow-up evaluation of right ankle sprain.  She states that after couple days and wearing the boot the swelling much improved.  She said that she is doing a lot better.  She does with ankle brace and when she wears the boot she has a lift on the left heel.  No significant discomfort today.  Denies any systemic complaints such as fevers, chills, nausea, vomiting. No acute changes since last appointment, and no other complaints at this time.   Also since last saw her she had an injection in her back.  There is pain.  Overall feeling somewhat better.  Objective: AAO x3, NAD DP/PT pulses palpable bilaterally, CRT less than 3 seconds There is tenderness palpation along the lateral ankle complex most notably along the ATFL today.  There is increased anterior drawer compared to contralateral extremity.  No pain on peroneal tendons.  No proximal tib-fib pain.  No pain of the foot. No pain with calf compression, swelling, warmth, erythema  Assessment: Right ankle severe sprain  Plan: -All treatment options discussed with the patient including all alternatives, risks, complications.  -Overall she is doing better from pain and swelling standpoint.  Discussed that she can transition to the ankle brace as needed so when she is more even given her sciatica, back issues.  Also recommend physical therapy.  Exercises provided.  She is already going to physical therapy for her back so would like him to her ankle as well.  If not she will let me know and we can refer her specifically for the ankle as well. -Patient encouraged to call the office with any questions, concerns, change in symptoms.   Vivi Barrack DPM

## 2020-08-13 NOTE — Patient Instructions (Signed)
Ankle Sprain, Phase I Rehab An ankle sprain is an injury to the ligaments of your ankle. Ankle sprains cause stiffness, loss of motion, and loss of strength. Ask your health care provider which exercises are safe for you. Do exercises exactly as told by your health care provider and adjust them as directed. It is normal to feel mild stretching, pulling, tightness, or discomfort as you do these exercises. Stop right away if you feel sudden pain or your pain gets worse. Do not begin these exercises until told by your health care provider. Stretching and range-of-motion exercises These exercises warm up your muscles and joints and improve the movement and flexibility of your lower leg and ankle. These exercises also help to relievepain and stiffness. Gastroc and soleus stretch This exercise is also called a calf stretch. It stretches the muscles in the back of the lower leg. These muscles are the gastrocnemius, or gastroc, and the soleus. Sit on the floor with your left / right leg extended. Loop a belt or towel around the ball of your left / right foot. The ball of your foot is on the walking surface, right under your toes. Keep your left / right ankle and foot relaxed and keep your knee straight while you use the belt or towel to pull your foot toward you. You should feel a gentle stretch behind your calf or knee in your gastroc muscle. Hold this position for __________ seconds, then release to the starting position. Repeat the exercise with your knee bent. You can put a pillow or a rolled bath towel under your knee to support it. You should feel a stretch deep in your calf in the soleus muscle or at your Achilles tendon. Repeat __________ times. Complete this exercise __________ times a day. Ankle alphabet  Sit with your left / right leg supported at the lower leg. Do not rest your foot on anything. Make sure your foot has room to move freely. Think of your left / right foot as a paintbrush. Move  your foot to trace each letter of the alphabet in the air. Keep your hip and knee still while you trace. Make the letters as large as you can without feeling discomfort. Trace every letter from A to Z. Repeat __________ times. Complete this exercise __________ times a day. Strengthening exercises These exercises build strength and endurance in your ankle and lower leg. Endurance is the ability to use your muscles for a long time, even after theyget tired. Ankle dorsiflexion  Secure a rubber exercise band or tube to an object, such as a table leg, that will stay still when the band is pulled. Secure the other end around your left / right foot. Sit on the floor facing the object, with your left / right leg extended. The band or tube should be slightly tense when your foot is relaxed. Slowly bring your foot toward you, bringing the top of your foot toward your shin (dorsiflexion), and pulling the band tighter. Hold this position for __________ seconds. Slowly return your foot to the starting position. Repeat __________ times. Complete this exercise __________ times a day. Ankle plantar flexion  Sit on the floor with your left / right leg extended. Loop a rubber exercise tube or band around the ball of your left / right foot. The ball of your foot is on the walking surface, right under your toes. Hold the ends of the band or tube in your hands. The band or tube should be slightly tense when   your foot is relaxed. Slowly point your foot and toes downward to tilt the top of your foot away from your shin (plantar flexion). Hold this position for __________ seconds. Slowly return your foot to the starting position. Repeat __________ times. Complete this exercise __________ times a day. Ankle eversion Sit on the floor with your legs straight out in front of you. Loop a rubber exercise band or tube around the ball of your left / right foot. The ball of your foot is on the walking surface, right under  your toes. Hold the ends of the band in your hands, or secure the band to a stable object. The band or tube should be slightly tense when your foot is relaxed. Slowly push your foot outward, away from your other leg (eversion). Hold this position for __________ seconds. Slowly return your foot to the starting position. Repeat __________ times. Complete this exercise __________ times a day. This information is not intended to replace advice given to you by your health care provider. Make sure you discuss any questions you have with your healthcare provider. Document Revised: 05/28/2018 Document Reviewed: 11/19/2017 Elsevier Patient Education  2022 [employer].  

## 2020-08-18 ENCOUNTER — Encounter: Payer: Self-pay | Admitting: Rehabilitative and Restorative Service Providers"

## 2020-08-18 ENCOUNTER — Other Ambulatory Visit: Payer: Self-pay

## 2020-08-18 ENCOUNTER — Ambulatory Visit (INDEPENDENT_AMBULATORY_CARE_PROVIDER_SITE_OTHER): Payer: Managed Care, Other (non HMO) | Admitting: Rehabilitative and Restorative Service Providers"

## 2020-08-18 ENCOUNTER — Other Ambulatory Visit: Payer: Self-pay | Admitting: Podiatry

## 2020-08-18 DIAGNOSIS — R293 Abnormal posture: Secondary | ICD-10-CM | POA: Diagnosis not present

## 2020-08-18 DIAGNOSIS — S93401A Sprain of unspecified ligament of right ankle, initial encounter: Secondary | ICD-10-CM

## 2020-08-18 DIAGNOSIS — R29898 Other symptoms and signs involving the musculoskeletal system: Secondary | ICD-10-CM

## 2020-08-18 DIAGNOSIS — M5416 Radiculopathy, lumbar region: Secondary | ICD-10-CM | POA: Diagnosis not present

## 2020-08-18 DIAGNOSIS — S93401D Sprain of unspecified ligament of right ankle, subsequent encounter: Secondary | ICD-10-CM

## 2020-08-18 NOTE — Patient Instructions (Signed)
Access Code: [email or web address]: [email or web address]: 06/29/2022Prepared by: Laure Leone HoltExercises  Prone Quadriceps Stretch with Strap - 2 x daily - 7 x weekly - 1 sets - 3 reps - 30 sec hold  Supine Piriformis Stretch with Leg Straight - 2 x daily - 7 x weekly - 1 sets - 3 reps - 30 sec hold  Prone Hip External Rotation AROM - 2 x daily - 7 x weekly - 1 sets - 15 reps - 1-2 sec hold  Wall Quarter Squat - 2 x daily - 7 x weekly - 1-2 sets - 10 reps - 5-10 sec hold  Sit to Stand - 2 x daily - 7 x weekly - 1 sets - 10 reps - 3-5 sec hold  Prone Press Up - 2 x daily - 7 x weekly - 1 sets - 10 reps - 2-3 sec hold  Standing Lumbar Extension - 2 x daily - 7 x weekly - 1 sets - 2-3 reps - 2-3 sec hold  Standing Gastroc Stretch - 2 x daily - 7 x weekly - 1 sets - 3 reps - 30 sec hold  Standing Gastroc Stretch - 2 x daily - 7 x weekly - 1 sets - 3 reps - 30 sec hold  Single Leg Stance - 2 x daily - 7 x weekly - 2 sets - 5 reps - 20 sec hold  Ankle Dorsiflexion with Resistance - 2 x daily - 7 x weekly - 1 sets - 3 reps - 30 sec hold  Ankle and Toe Plantarflexion with Resistance - 2 x daily - 7 x weekly - 1 sets - 3 reps - 30 sec hold  Ankle Eversion with Resistance - 2 x daily - 7 x weekly - 1 sets - 3 reps - 30 sec hold  Ankle Inversion with Resistance - 2 x daily - 7 x weekly - 1 sets - 3 reps - 30 sec hold

## 2020-08-18 NOTE — Therapy (Signed)
[facility] [place] [address] [place], [place], [address] Phone: [phone]   Fax:  [phone]  Physical Therapy Treatment  Patient Details  Name: Jocelyn Taylor MRN: [id number] Date of Birth: [birth year] Referring Provider (PT): Dr Benjamin Stain; Dr Ardelle Anton   Encounter Date: 08/18/2020   PT End of Session - 08/18/20 1254     Visit Number 12    Number of Visits 24    Date for PT Re-Evaluation 09/24/20    PT Start Time 1147    PT Stop Time 1233    PT Time Calculation (min) 46 min    Activity Tolerance Patient tolerated treatment well             Past Medical History:  Diagnosis Date   Goiter     Past Surgical History:  Procedure Laterality Date   BREAST BIOPSY Right 06/2006   TUBAL LIGATION Bilateral     There were no vitals filed for this visit.   Subjective Assessment - 08/18/20 1156     Subjective Patient reports that she reports that she tripped sustaining Rt ankle sprain 07/23/20. she was in boot for 2 weeks and is now in an ankle brace.    Currently in Pain? Yes    Pain Score 2     Pain Location Back    Pain Orientation Right    Pain Descriptors / Indicators Aching;Tightness    Pain Type Chronic pain    Pain Radiating Towards Rt posterior hip to thigh and tingling in calf    Pain Onset More than a month ago    Pain Score 0    Pain Location Ankle    Pain Orientation Right    Pain Descriptors / Indicators Tightness                [facility] PT Assessment - 08/18/20 0001       Assessment   Medical Diagnosis Lumbar dysfunction; Rt ankle sprain    Referring Provider (PT) Dr Benjamin Stain; Dr Ardelle Anton    Onset Date/Surgical Date 06/04/20   ankle 07/23/20   Hand Dominance Right    Prior Therapy chiripractic care for ~ past 10 yrs ~ monthly      AROM   Right/Left Ankle --   tightness and discomfort end range   Right Ankle Dorsiflexion 9    Right Ankle Plantar Flexion 54    Right Ankle Inversion 24     Right Ankle Eversion 12      Strength   Overall Strength Comments pain with resistive testing Rt Eversion; PF strength Rt ankle grossly 4/5      Palpation   Palpation comment tenderness and tightness to palpationlateral Rt ankle through the AFT ligament;  persistent muscular tightness in the Rt posterior hip through the piriformis; glut min/med into Rt hamstrings; Rt/Lt QL/lumbar paraspinals/lats      Ambulation/Gait   Gait Comments ambulates with slight antalgic gait with sandals - weaned from walking boot. Does use ankle brace at times during her day.      Balance   Balance Assessed Yes   decreased SLS Rt compared to Lt                          [facility] Adult PT Treatment/Exercise - 08/18/20 0001       Lumbar Exercises: Stretches   Passive Hamstring Stretch Limitations stretch to nerve tension tolerance - improving mobility    Standing Extension 3 reps;5  seconds    Press Ups 10 reps   2-3 sec hold through limited range note increasing range repeated  following DN   Piriformis Stretch Right;3 reps;30 seconds   supine travel with strap   Gastroc Stretch Right;3 reps;20 seconds    Gastroc Stretch Limitations soleus stretch 20 sec x 2 reps standing    Other Lumbar Stretch Exercise supine neural mobilization Rt LE 5 reps 2-3 sec hold      Lumbar Exercises: Standing   Other Standing Lumbar Exercises SLS 20 sec x 3 reps      Manual Therapy   Manual therapy comments skilled palpation to assess response to DN and manual work    Joint Mobilization Greater trochanter PA mobs    Soft tissue mobilization posterior hip through the Rt piriformis and gluts; bilat lumbar musculature; Lt > Rt tissue btn ischial tuberosity to coccyx    Myofascial Release Rt posterior hip    Passive ROM Rt hip IR/ER hip extended knee flexed pt prone; knee flexion to stretch quads 20-30 sec x 2 ea side      Ankle Exercises: Supine   T-Band Eversioin/inversion/DF/PF 5 reps x 3 sec hold yellow TB  (issued yellow and red TB for home    Other Supine Ankle Exercises ankle pumps; circles; alphabet LE elevated              Trigger Point Dry Needling - 08/18/20 0001     Consent Given? Yes    Education Handout Provided Previously provided    Dry Needling Comments Rt    Gluteus Minimus Response Palpable increased muscle length    Gluteus Medius Response Palpable increased muscle length    Gluteus Maximus Response Palpable increased muscle length    Piriformis Response Palpable increased muscle length                  PT Education - 08/18/20 1255     Education Details HEP    Person(s) Educated Patient    Methods Explanation;Demonstration;Tactile cues;Verbal cues;Handout    Comprehension Verbalized understanding;Returned demonstration;Verbal cues required;Tactile cues required              PT Short Term Goals - 08/18/20 1301       PT SHORT TERM GOAL #1   Title Increase Rt ankle ROM by 3-6 degrees throughout    Time 4    Period Weeks    Status New    Target Date 09/24/20      PT SHORT TERM GOAL #2   Title 5/5 strength Rt ankle    Time 4    Period Weeks    Status New    Target Date 09/24/20      PT SHORT TERM GOAL #3   Title ambulate without abnormal gait pattern related to ankle mobility/stiffness/weakness    Time 4    Period Weeks    Status New    Target Date 09/24/20      PT SHORT TERM GOAL #4   Title Independent in ankle exercises program for home    Time 4    Period Weeks    Status New    Target Date 09/24/20               PT Long Term Goals - 08/13/20 1240       PT LONG TERM GOAL #1   Title Decrease pain allowing patient to transfer form sit to stand and walk for functional distances with minimal pain to no or  discomfort.    Time 6    Period Weeks    Status On-going    Target Date 09/24/20      PT LONG TERM GOAL #2   Title Improve core strength and stability allowing patient to preform lifting and bending activities  associated with ADL's and work activities with minimal to no pain or discomfort    Time 6    Period Weeks    Status On-going    Target Date 09/24/20      PT LONG TERM GOAL #3   Title Patient to verbalize and demonstrate proper body mechanics for transfers, lifting, bending    Time 6    Period Weeks    Status On-going    Target Date 09/24/20      PT LONG TERM GOAL #4   Title Independent in HEP    Time 6    Period Weeks    Status On-going    Target Date 09/24/20      PT LONG TERM GOAL #5   Title Improve functional limitation score to 71    Time 6    Period Weeks    Status On-going    Target Date 09/24/20                   Plan - 08/18/20 1212     Clinical Impression Statement Patient sustained Rt ankle sprain 07/23/20. She was placed in walking boot x 2 weeks and is now in ankle brace as needed. Patient has limited Rt ankle ROM and strength. she has tenderness and tightness to palpation along the lateral Rt ankle in the ATF ligament area and lateral malleolus with noted edema in this area. She will benefit from PT to address problems identified. Patient has noted some improvement in LBP and Rt LE radicular pain following ESI. She meet with surgeon 08/16/20 and he recommends lumbar disectomy. She is awaiting scheduling for surgery. Patient will benefit from continued PT to progress with core stabilization and strengthening.    Rehab Potential Good    PT Frequency 2x / week    PT Duration 6 weeks    PT Treatment/Interventions ADLs/Self Care Home Management;Aquatic Therapy;Cryotherapy;Electrical Stimulation;Iontophoresis 4mg /ml Dexamethasone;Moist Heat;Ultrasound;Functional mobility training;Therapeutic activities;Therapeutic exercise;Neuromuscular re-education;Patient/family education;Manual techniques;Dry needling;Taping    PT Next Visit Plan Begin ankle rehab including ROM; strengthening; balance; higher level activities as tolerated. Progress treatment for LB as pain allows -  progress with core stabilization; DN and manual work Rt biceps femoris / posterior hip musculature. Progress with core stabilization and add lifting as tolerated; continue pelvic floor release with ball as tolerated    PT Home Exercise Plan [email or web address]    Consulted and Agree with Plan of Care Patient             Patient will benefit from skilled therapeutic intervention in order to improve the following deficits and impairments:  Decreased range of motion, Increased fascial restricitons, Increased muscle spasms, Pain, Decreased balance, Hypomobility, Impaired flexibility, Improper body mechanics, Other (comment), Decreased mobility, Decreased strength, Postural dysfunction  Visit Diagnosis: Radiculopathy, lumbar region  Other symptoms and signs involving the musculoskeletal system  Abnormal posture  Sprain of right ankle, unspecified ligament, initial encounter     Problem List Patient Active Problem List   Diagnosis Date Noted   Lumbar degenerative disc disease 06/18/2020   Great toe pain, right 05/21/2020   Chronic neck pain 11/17/2019   Myalgia 11/17/2019   Hemorrhoids 09/10/2018   ADHD (attention deficit hyperactivity disorder), 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

## 2020-08-20 ENCOUNTER — Encounter: Payer: Self-pay | Admitting: Rehabilitative and Restorative Service Providers"

## 2020-08-20 ENCOUNTER — Other Ambulatory Visit: Payer: Self-pay

## 2020-08-20 ENCOUNTER — Ambulatory Visit (INDEPENDENT_AMBULATORY_CARE_PROVIDER_SITE_OTHER): Payer: Managed Care, Other (non HMO) | Admitting: Rehabilitative and Restorative Service Providers"

## 2020-08-20 DIAGNOSIS — R29898 Other symptoms and signs involving the musculoskeletal system: Secondary | ICD-10-CM | POA: Diagnosis not present

## 2020-08-20 DIAGNOSIS — M5416 Radiculopathy, lumbar region: Secondary | ICD-10-CM

## 2020-08-20 DIAGNOSIS — S93401A Sprain of unspecified ligament of right ankle, initial encounter: Secondary | ICD-10-CM | POA: Diagnosis not present

## 2020-08-20 DIAGNOSIS — R293 Abnormal posture: Secondary | ICD-10-CM

## 2020-08-20 NOTE — Therapy (Addendum)
[place], [place], [address] Phone: [phone]   Fax:  [phone]  Physical Therapy Treatment  Patient Details  Name: CRYSTALYN DELIA MRN: [id number] Date of Birth: Sep 24, 1985 Referring Provider (PT): Dr Dianah Field; Dr Jacqualyn Posey   Encounter Date: 08/20/2020   PT End of Session - 08/20/20 1122     Visit Number 13    Number of Visits 24    Date for PT Re-Evaluation 09/24/20    PT Start Time 1115    PT Stop Time 1155    PT Time Calculation (min) 40 min    Activity Tolerance Patient tolerated treatment well             Past Medical History:  Diagnosis Date   Goiter     Past Surgical History:  Procedure Laterality Date   BREAST BIOPSY Right 06/2006   TUBAL LIGATION Bilateral     There were no vitals filed for this visit.   Subjective Assessment - 08/20/20 1123     Subjective Patient reports that she is feeling better. Ankle is painfree today. She wi working on exercises for ankle and for back at home. Has also increased activities at home.    Currently in Pain? Yes    Pain Score 2     Pain Location Back    Pain Orientation Right    Pain Descriptors / Indicators Aching;Tightness    Pain Type Chronic pain    Pain Score 0                               [facility] Adult PT Treatment/Exercise - 08/20/20 0001       Exercises   Exercises --   reviewed lumbar stabilization exercises     Manual Therapy   Manual therapy comments skilled palpation to assess response to DN and manual work    Joint Mobilization Greater trochanter PA mobs    Soft tissue mobilization posterior hip through the Rt piriformis and gluts; bilat lumbar musculature; Lt > Rt tissue btn ischial tuberosity to coccyx    Myofascial Release Rt posterior hip    Passive ROM Rt hip IR/ER hip extended knee flexed pt prone; knee flexion to stretch quads 20-30 sec x 2 ea side      Ankle Exercises: Supine    T-Band Eversioin/inversion/DF/PF 5 reps x 3 sec hold yellow TB (issued yellow and red TB for home    Other Supine Ankle Exercises ankle pumps; circles; alphabet LE elevated    Other Supine Ankle Exercises single leg stance 20-30 sec x 3; heel raises x 10; toe raises for home; step up 6 in step Rt x 10; gastroc stretch 30 sec x 2; soleus stretch 30 sec x 2              Trigger Point Dry Needling - 08/20/20 0001     Consent Given? Yes    Education Handout Provided Previously provided    Dry Needling Comments Rt    Gluteus Minimus Response Palpable increased muscle length    Gluteus Medius Response Palpable increased muscle length    Gluteus Maximus Response Palpable increased muscle length    Piriformis Response Palpable increased muscle length                  PT Education - 08/20/20 1146     Education Details HEP    Person(s) Educated Patient  Methods Explanation;Demonstration;Tactile cues;Verbal cues;Handout    Comprehension Verbalized understanding;Returned demonstration;Verbal cues required;Tactile cues required              PT Short Term Goals - 08/18/20 1301       PT SHORT TERM GOAL #1   Title Increase Rt ankle ROM by 3-6 degrees throughout    Time 4    Period Weeks    Status New    Target Date 09/24/20      PT SHORT TERM GOAL #2   Title 5/5 strength Rt ankle    Time 4    Period Weeks    Status New    Target Date 09/24/20      PT SHORT TERM GOAL #3   Title ambulate without abnormal gait pattern related to ankle mobility/stiffness/weakness    Time 4    Period Weeks    Status New    Target Date 09/24/20      PT SHORT TERM GOAL #4   Title Independent in ankle exercises program for home    Time 4    Period Weeks    Status New    Target Date 09/24/20               PT Long Term Goals - 08/13/20 1240       PT LONG TERM GOAL #1   Title Decrease pain allowing patient to transfer form sit to stand and walk for functional distances with  minimal pain to no or discomfort.    Time 6    Period Weeks    Status On-going    Target Date 09/24/20      PT LONG TERM GOAL #2   Title Improve core strength and stability allowing patient to preform lifting and bending activities associated with ADL's and work activities with minimal to no pain or discomfort    Time 6    Period Weeks    Status On-going    Target Date 09/24/20      PT LONG TERM GOAL #3   Title Patient to verbalize and demonstrate proper body mechanics for transfers, lifting, bending    Time 6    Period Weeks    Status On-going    Target Date 09/24/20      PT LONG TERM GOAL #4   Title Independent in HEP    Time 6    Period Weeks    Status On-going    Target Date 09/24/20      PT LONG TERM GOAL #5   Title Improve functional limitation score to 71    Time 6    Period Weeks    Status On-going    Target Date 09/24/20                   Plan - 08/20/20 1125     Clinical Impression Statement Continued gradual improvement in lumbar radiculopathy with patient having less pain and reporting incresed activity. She has decreased palpable tightness through the posterior Rt hip and thigh. Patient has resumed all core stabilization exercises. She is out of walking boot for Rt ankle and uses an ankle brace intermittently. Reviewed ankle/LE exercises and added strengthening exercises. Patient is progressing well with lumbar and ankle rehab. She will continue with independent HEP and schedule appt for ~ 2 weeks to progress with exercise program.    Rehab Potential Good    PT Frequency 2x / week    PT Duration 6 weeks    PT Treatment/Interventions  ADLs/Self Care Home Management;Aquatic Therapy;Cryotherapy;Electrical Stimulation;Iontophoresis 47m/ml Dexamethasone;Moist Heat;Ultrasound;Functional mobility training;Therapeutic activities;Therapeutic exercise;Neuromuscular re-education;Patient/family education;Manual techniques;Dry needling;Taping    PT Next Visit Plan  Continue ankle rehab including ROM; strengthening; balance; higher level activities as tolerated. Progress treatment for LB as pain allows - progress with core stabilization; DN and manual work Rt biceps femoris / posterior hip musculature. Progress with core stabilization and add lifting as tolerated    PT Home Exercise Plan RHarperand Agree with Plan of Care Patient             Patient will benefit from skilled therapeutic intervention in order to improve the following deficits and impairments:     Visit Diagnosis: Radiculopathy, lumbar region  Other symptoms and signs involving the musculoskeletal system  Abnormal posture  Sprain of right ankle, unspecified ligament, initial encounter     Problem List Patient Active Problem List   Diagnosis Date Noted   Lumbar degenerative disc disease 06/18/2020   Great toe pain, right 05/21/2020   Chronic neck pain 11/17/2019   Myalgia 11/17/2019   Hemorrhoids 09/10/2018   ADHD (attention deficit hyperactivity disorder), inattentive type 12/23/2017   Skin nodule 02/10/2017   Idiopathic scoliosis 01/22/2015   Sacroiliac joint dysfunction of both sides 10/26/2014   Adult ADHD 06/23/2014   Anxiety 11/21/2013   Inattention 11/21/2013   Hashimoto's thyroiditis 11/12/2013   Acquired hypothyroidism 11/11/2013   Multiple thyroid nodules 11/07/2013    Angelene Rome PNilda SimmerPT, MPH  08/20/2020, 12:15 PM  [address] [address] [address] [address]Phone: [phone]  Fax:  [phone] Name: Jocelyn HIETALAMRN: [id number] of Birth: 910-Feb-1987 PHYSICAL THERAPY DISCHARGE SUMMARY  Visits from Start of Care: 13  Current functional level related to goals / functional outcomes: See last progress note for discharge status   Remaining deficits: Continued radicular symptoms    Education / Equipment: HEP    Patient agrees to discharge. Patient goals  were not met. Patient is being discharged due to  patient to proceed with lumbar discetomy.   Zacherie Honeyman P. HHelene KelpPT, MPH 09/29/20 7:44 AM

## 2020-08-20 NOTE — Patient Instructions (Signed)
Access Code: [email or web address]: [email or web address]: 07/01/2022Prepared by: Mel Tadros HoltExercises  Prone Press Up - 2 x daily - 7 x weekly - 1 sets - 10 reps - 2-3 sec hold  Supine Piriformis Stretch with Leg Straight - 2 x daily - 7 x weekly - 1 sets - 3 reps - 30 sec hold  Supine Sciatic Nerve Glide - 2 x daily - 7 x weekly - 1 sets - 8-10 reps - 1-2 sec hold  Supine Transversus Abdominis Bracing with Pelvic Floor Contraction - 2 x daily - 7 x weekly - 1 sets - 10 reps - 10sec hold  Hooklying Isometric Clamshell - 2 x daily - 7 x weekly - 1 sets - 10 reps - 3 sec hold  Sit to Stand - 2 x daily - 7 x weekly - 1 sets - 10 reps - 3-5 sec hold  Wall Quarter Squat - 2 x daily - 7 x weekly - 1-2 sets - 10 reps - 5-10 sec hold  Supine Bridge with Resistance Band - 2 x daily - 7 x weekly - 1-2 sets - 10 reps - 5-10 sec hold  Standing Lumbar Extension - 2 x daily - 7 x weekly - 1 sets - 2-3 reps - 2-3 sec hold  Standing Bilateral Low Shoulder Row with Anchored Resistance - 2 x daily - 7 x weekly - 1-3 sets - 10 reps - 2-3 sec hold  Shoulder Extension with Resistance - 2 x daily - 7 x weekly - 1-3 sets - 10 reps - 2-3 sec hold  Anti-Rotation Lateral Stepping with Press - 2 x daily - 7 x weekly - 1-2 sets - 10 reps - 2-3 sec hold  Plank on Counter - 2 x daily - 7 x weekly - 1 sets - 3 reps - 60 sec hold  Standing Lat Pull Down with Resistance - Elbows Bent - 2 x daily - 7 x weekly - 1 sets - 10 reps - 3 sec hold  Seated Ankle Circles - 1 x daily - 7 x weekly - 10 reps - 3 sets  Seated Ankle Alphabet - 3 x daily - 7 x weekly - 5 reps - 1 sets  Seated Heel Raise - 3 x daily - 7 x weekly - 10 reps - 1 sets  Seated Toe Raise - 3 x daily - 7 x weekly - 10 reps - 1 sets  Ankle Dorsiflexion with Resistance - 2 x daily - 7 x weekly - 1 sets - 3 reps - 30 sec hold  Ankle Eversion with Resistance - 2 x daily - 7 x weekly - 1 sets - 3 reps - 30 sec hold  Ankle Inversion with Resistance - 2 x daily - 7 x  weekly - 1 sets - 3 reps - 30 sec hold  Ankle and Toe Plantarflexion with Resistance - 2 x daily - 7 x weekly - 1 sets - 3 reps - 30 sec hold  Single Leg Stance - 2 x daily - 7 x weekly - 2 sets - 5 reps - 20 sec hold  Step Up - 2 x daily - 7 x weekly - 1 sets - 10 reps - 2 sec hold  Gastroc Stretch on Wall - 2 x daily - 7 x weekly - 1 sets - 3 reps - 30 sec hold  Standing Soleus Stretch - 2 x daily - 7 x weekly - 1 sets - 3 reps - 30 sec hold  Standing Heel Raise with  Support - 2 x daily - 7 x weekly - 1-2 sets - 10 reps - 2-3 sec hold  Toe Raises with Counter Support - 2 x daily - 7 x weekly - 1-2 sets - 10 reps - 2-3 sec hold

## 2020-08-26 ENCOUNTER — Other Ambulatory Visit: Payer: Self-pay | Admitting: Orthopedic Surgery

## 2020-08-30 ENCOUNTER — Other Ambulatory Visit: Payer: Self-pay

## 2020-08-30 ENCOUNTER — Ambulatory Visit (INDEPENDENT_AMBULATORY_CARE_PROVIDER_SITE_OTHER): Payer: Managed Care, Other (non HMO) | Admitting: Rehabilitative and Restorative Service Providers"

## 2020-08-30 DIAGNOSIS — M5416 Radiculopathy, lumbar region: Secondary | ICD-10-CM

## 2020-08-30 DIAGNOSIS — S93401A Sprain of unspecified ligament of right ankle, initial encounter: Secondary | ICD-10-CM

## 2020-08-30 DIAGNOSIS — R293 Abnormal posture: Secondary | ICD-10-CM

## 2020-08-30 DIAGNOSIS — R29898 Other symptoms and signs involving the musculoskeletal system: Secondary | ICD-10-CM

## 2020-08-30 NOTE — Therapy (Signed)
[facility] [place] [address] [place], [place], [address] Phone: [phone]   Fax:  [phone]  Patient Details  Name: Jocelyn Taylor MRN: [id number] Date of Birth: April 25, 1985 Referring Provider:  Monica Becton,*  Encounter Date: 08/30/2020  Patient did not show for scheduled appointment. Chart opened in error.  Jermaine Tholl Rober Minion 08/30/2020, 12:53 PM  [facility] [address] [place], [place], [address] Phone: [phone]   Fax:  [phone]

## 2020-09-01 ENCOUNTER — Encounter: Payer: Self-pay | Admitting: Rehabilitative and Restorative Service Providers"

## 2020-09-01 ENCOUNTER — Encounter: Payer: Self-pay | Admitting: Physician Assistant

## 2020-09-01 ENCOUNTER — Other Ambulatory Visit: Payer: Self-pay | Admitting: Physician Assistant

## 2020-09-01 MED ORDER — VALACYCLOVIR HCL 1 G PO TABS: 1000.0000 mg | ORAL_TABLET | Freq: Two times a day (BID) | ORAL | 0 refills | Status: DC

## 2020-09-02 MED ORDER — METHYLPHENIDATE HCL ER (OSM) 27 MG PO TBCR: 27.0000 mg | EXTENDED_RELEASE_TABLET | ORAL | 0 refills | Status: DC

## 2020-09-23 NOTE — Progress Notes (Signed)
Surgical Instructions    Your procedure is scheduled on Wednesday august 10th.  Report to [facility] Main Entrance "A" at 10 A.M., then check in with the Admitting office.  Call this number if you have problems the morning of surgery:  [phone]   If you have any questions prior to your surgery date call [phone]: Open Monday-Friday 8am-4pm    Remember:  Do not eat after midnight the night before your surgery  You may drink clear liquids until 10am the morning of your surgery.   Clear liquids allowed are: Water, Non-Citrus Juices (without pulp), Carbonated Beverages, Clear Tea, Black Coffee Only, and Gatorade    Enhanced Recovery after Surgery for Orthopedics Enhanced Recovery after Surgery is a protocol used to improve the stress on your body and your recovery after surgery.  Patient Instructions  The day of surgery (if you do NOT have diabetes):  Drink ONE (1) Pre-Surgery Clear Ensure by __10___ am the morning of surgery   This drink was given to you during your hospital  pre-op appointment visit. Nothing else to drink after completing the  Pre-Surgery Clear Ensure.          If you have questions, please contact your surgeon's office.   Take these medicines the morning of surgery with A SIP OF WATER IF NEEDED valACYclovir (VALTREX) 1000 MG tablet    As of today, STOP taking any Aspirin (unless otherwise instructed by your surgeon) Aleve, Naproxen, Ibuprofen, Motrin, Advil, Goody's, BC's, all herbal medications, fish oil, and all vitamins.          Do not wear jewelry or makeup Do not wear lotions, powders, perfumes, or deodorant. Do not shave 48 hours prior to surgery.   Do not bring valuables to the hospital. DO Not wear nail polish, gel polish, artificial nails, or any other type of covering on  natural nails including finger and toenails. If patients have artificial nails, gel coating, etc. that need to be removed by a nail salon please have this removed prior  to surgery or surgery may need to be canceled/delayed if the surgeon/ anesthesia feels like the patient is unable to be adequately monitored.             [facility] is not responsible for any belongings or valuables.  Do NOT Smoke (Tobacco/Vaping) or drink Alcohol 24 hours prior to your procedure If you use a CPAP at night, you may bring all equipment for your overnight stay.   Contacts, glasses, dentures or bridgework may not be worn into surgery, please bring cases for these belongings   For patients admitted to the hospital, discharge time will be determined by your treatment team.   Patients discharged the day of surgery will not be allowed to drive home, and someone needs to stay with them for 24 hours.  ONLY 1 SUPPORT PERSON MAY BE PRESENT WHILE YOU ARE IN SURGERY. IF YOU ARE TO BE ADMITTED ONCE YOU ARE IN YOUR ROOM YOU WILL BE ALLOWED TWO (2) VISITORS.  Minor children may have two parents present. Special consideration for safety and communication needs will be reviewed on a case by case basis.  Special instructions:    Oral Hygiene is also important to reduce your risk of infection.  Remember - BRUSH YOUR TEETH THE MORNING OF SURGERY WITH YOUR REGULAR TOOTHPASTE   [facility]- Preparing For Surgery  Before surgery, you can play an important role. Because skin is not sterile, your skin needs to be as free  of germs as possible. You can reduce the number of germs on your skin by washing with CHG (chlorahexidine gluconate) Soap before surgery.  CHG is an antiseptic cleaner which kills germs and bonds with the skin to continue killing germs even after washing.     Please do not use if you have an allergy to CHG or antibacterial soaps. If your skin becomes reddened/irritated stop using the CHG.  Do not shave (including legs and underarms) for at least 48 hours prior to first CHG shower. It is OK to shave your face.  Please follow these instructions carefully.     Shower the NIGHT  BEFORE SURGERY and the MORNING OF SURGERY with CHG Soap.   If you chose to wash your hair, wash your hair first as usual with your normal shampoo. After you shampoo, rinse your hair and body thoroughly to remove the shampoo.  Then [employer] and genitals (private parts) with your normal soap and rinse thoroughly to remove soap.  After that Use CHG Soap as you would any other liquid soap. You can apply CHG directly to the skin and wash gently with a scrungie or a clean washcloth.   Apply the CHG Soap to your body ONLY FROM THE NECK DOWN.  Do not use on open wounds or open sores. Avoid contact with your eyes, ears, mouth and genitals (private parts). Wash Face and genitals (private parts)  with your normal soap.   Wash thoroughly, paying special attention to the area where your surgery will be performed.  Thoroughly rinse your body with warm water from the neck down.  DO NOT shower/wash with your normal soap after using and rinsing off the CHG Soap.  Pat yourself dry with a CLEAN TOWEL.  Wear CLEAN PAJAMAS to bed the night before surgery  Place CLEAN SHEETS on your bed the night before your surgery  DO NOT SLEEP WITH PETS.   Day of Surgery:  Take a shower with CHG soap. Wear Clean/Comfortable clothing the morning of surgery Do not apply any deodorants/lotions.   Remember to brush your teeth WITH YOUR REGULAR TOOTHPASTE.   Please read over the following fact sheets that you were given.

## 2020-09-24 ENCOUNTER — Encounter (HOSPITAL_COMMUNITY)
Admission: RE | Admit: 2020-09-24 | Discharge: 2020-09-24 | Disposition: A | Discharge status: Home / Self Care | Payer: Managed Care, Other (non HMO) | Source: Ambulatory Visit | Attending: Orthopedic Surgery | Admitting: Orthopedic Surgery

## 2020-09-24 ENCOUNTER — Other Ambulatory Visit: Payer: Self-pay

## 2020-09-24 ENCOUNTER — Encounter (HOSPITAL_COMMUNITY): Payer: Self-pay

## 2020-09-24 DIAGNOSIS — Z01812 Encounter for preprocedural laboratory examination: Secondary | ICD-10-CM | POA: Insufficient documentation

## 2020-09-24 HISTORY — DX: Attention-deficit hyperactivity disorder, unspecified type: F90.9

## 2020-09-24 LAB — URINALYSIS, ROUTINE W REFLEX MICROSCOPIC
Bacteria, UA: NONE SEEN
Bilirubin Urine: NEGATIVE
Glucose, UA: NEGATIVE mg/dL
Ketones, ur: NEGATIVE mg/dL
Leukocytes,Ua: NEGATIVE
Nitrite: NEGATIVE
Protein, ur: NEGATIVE mg/dL
Specific Gravity, Urine: 1.009 (ref 1.005–1.030)
pH: 6 (ref 5.0–8.0)

## 2020-09-24 LAB — CBC WITH DIFFERENTIAL/PLATELET
Abs Immature Granulocytes: 0.03 10*3/uL (ref 0.00–0.07)
Basophils Absolute: 0.1 10*3/uL (ref 0.0–0.1)
Basophils Relative: 1 %
Eosinophils Absolute: 0.2 10*3/uL (ref 0.0–0.5)
Eosinophils Relative: 2 %
HCT: 38.7 % (ref 36.0–46.0)
Hemoglobin: 12.2 g/dL (ref 12.0–15.0)
Immature Granulocytes: 0 %
Lymphocytes Relative: 22 %
Lymphs Abs: 1.6 10*3/uL (ref 0.7–4.0)
MCH: 28 pg (ref 26.0–34.0)
MCHC: 31.5 g/dL (ref 30.0–36.0)
MCV: 89 fL (ref 80.0–100.0)
Monocytes Absolute: 0.6 10*3/uL (ref 0.1–1.0)
Monocytes Relative: 8 %
Neutro Abs: 4.9 10*3/uL (ref 1.7–7.7)
Neutrophils Relative %: 67 %
Platelets: 430 10*3/uL — ABNORMAL HIGH (ref 150–400)
RBC: 4.35 MIL/uL (ref 3.87–5.11)
RDW: 16.6 % — ABNORMAL HIGH (ref 11.5–15.5)
WBC: 7.4 10*3/uL (ref 4.0–10.5)
nRBC: 0 % (ref 0.0–0.2)

## 2020-09-24 LAB — COMPREHENSIVE METABOLIC PANEL
ALT: 20 U/L (ref 0–44)
AST: 19 U/L (ref 15–41)
Albumin: 3.9 g/dL (ref 3.5–5.0)
Alkaline Phosphatase: 57 U/L (ref 38–126)
Anion gap: 7 (ref 5–15)
BUN: 11 mg/dL (ref 6–20)
CO2: 24 mmol/L (ref 22–32)
Calcium: 9.3 mg/dL (ref 8.9–10.3)
Chloride: 105 mmol/L (ref 98–111)
Creatinine, Ser: 0.6 mg/dL (ref 0.44–1.00)
GFR, Estimated: >60 mL/min (ref >60)
Glucose, Bld: 88 mg/dL (ref 70–99)
Potassium: 4.1 mmol/L (ref 3.5–5.1)
Sodium: 136 mmol/L (ref 135–145)
Total Bilirubin: 0.5 mg/dL (ref 0.3–1.2)
Total Protein: 7.3 g/dL (ref 6.5–8.1)

## 2020-09-24 LAB — TYPE AND SCREEN
ABO/RH(D): A NEG
Antibody Screen: NEGATIVE

## 2020-09-24 LAB — SURGICAL PCR SCREEN
MRSA, PCR: NEGATIVE
Staphylococcus aureus: POSITIVE — AB

## 2020-09-24 LAB — PROTIME-INR
INR: 1 (ref 0.8–1.2)
Prothrombin Time: 12.8 seconds (ref 11.4–15.2)

## 2020-09-24 LAB — APTT: aPTT: 32 seconds (ref 24–36)

## 2020-09-24 NOTE — Progress Notes (Signed)
PCP - Tandy Gaw PA Cardiologist -   PPM/ICD - denies Device Orders -  Rep Notified -   Chest x-ray - none EKG - none Stress Test - none ECHO - none Cardiac Cath - denies  Sleep Study - denies CPAP -   Fasting Blood Sugar - n/a Checks Blood Sugar _____ times a day  Blood Thinner Instructions:n/a Aspirin Instructions:n/a  ERAS Protcol -clear liquids until 1000 PRE-SURGERY Ensure or G2- Ensure  COVID TEST- n/a- ambulatory surgery   Anesthesia review: no  Patient denies shortness of breath, fever, cough and chest pain at PAT appointment   All instructions explained to the patient, with a verbal understanding of the material. Patient agrees to go over the instructions while at home for a better understanding. Patient also instructed to self quarantine after being tested for COVID-19. The opportunity to ask questions was provided.

## 2020-09-29 ENCOUNTER — Ambulatory Visit (HOSPITAL_COMMUNITY)
Admission: RE | Admit: 2020-09-29 | Discharge: 2020-09-29 | Disposition: A | Discharge status: Home / Self Care | Payer: Managed Care, Other (non HMO) | Attending: Orthopedic Surgery | Admitting: Orthopedic Surgery

## 2020-09-29 ENCOUNTER — Encounter (HOSPITAL_COMMUNITY): Payer: Self-pay | Admitting: Orthopedic Surgery

## 2020-09-29 ENCOUNTER — Other Ambulatory Visit: Payer: Self-pay

## 2020-09-29 ENCOUNTER — Ambulatory Visit (HOSPITAL_COMMUNITY): Payer: Managed Care, Other (non HMO) | Admitting: Anesthesiology

## 2020-09-29 ENCOUNTER — Ambulatory Visit (HOSPITAL_COMMUNITY)
Admission: RE | Disposition: A | Discharge status: Home / Self Care | Payer: Self-pay | Source: Home / Self Care | Attending: Orthopedic Surgery

## 2020-09-29 ENCOUNTER — Ambulatory Visit (HOSPITAL_COMMUNITY): Payer: Managed Care, Other (non HMO)

## 2020-09-29 DIAGNOSIS — M5117 Intervertebral disc disorders with radiculopathy, lumbosacral region: Secondary | ICD-10-CM | POA: Diagnosis present

## 2020-09-29 DIAGNOSIS — Z419 Encounter for procedure for purposes other than remedying health state, unspecified: Secondary | ICD-10-CM

## 2020-09-29 DIAGNOSIS — Z79899 Other long term (current) drug therapy: Secondary | ICD-10-CM | POA: Diagnosis not present

## 2020-09-29 HISTORY — PX: LUMBAR LAMINECTOMY/DECOMPRESSION MICRODISCECTOMY: SHX5026

## 2020-09-29 LAB — ABO/RH: ABO/RH(D): A NEG

## 2020-09-29 LAB — POCT PREGNANCY, URINE: Preg Test, Ur: NEGATIVE

## 2020-09-29 IMAGING — CR DG LUMBAR SPINE 2-3V
2 series · 2 of 2 positions shown · non-contrast
Comparison: MRI lumbar spine [DATE]

CLINICAL DATA: Back surgery

EXAM:
LUMBAR SPINE - 2-3 VIEW

[lateral (1 of 2)]
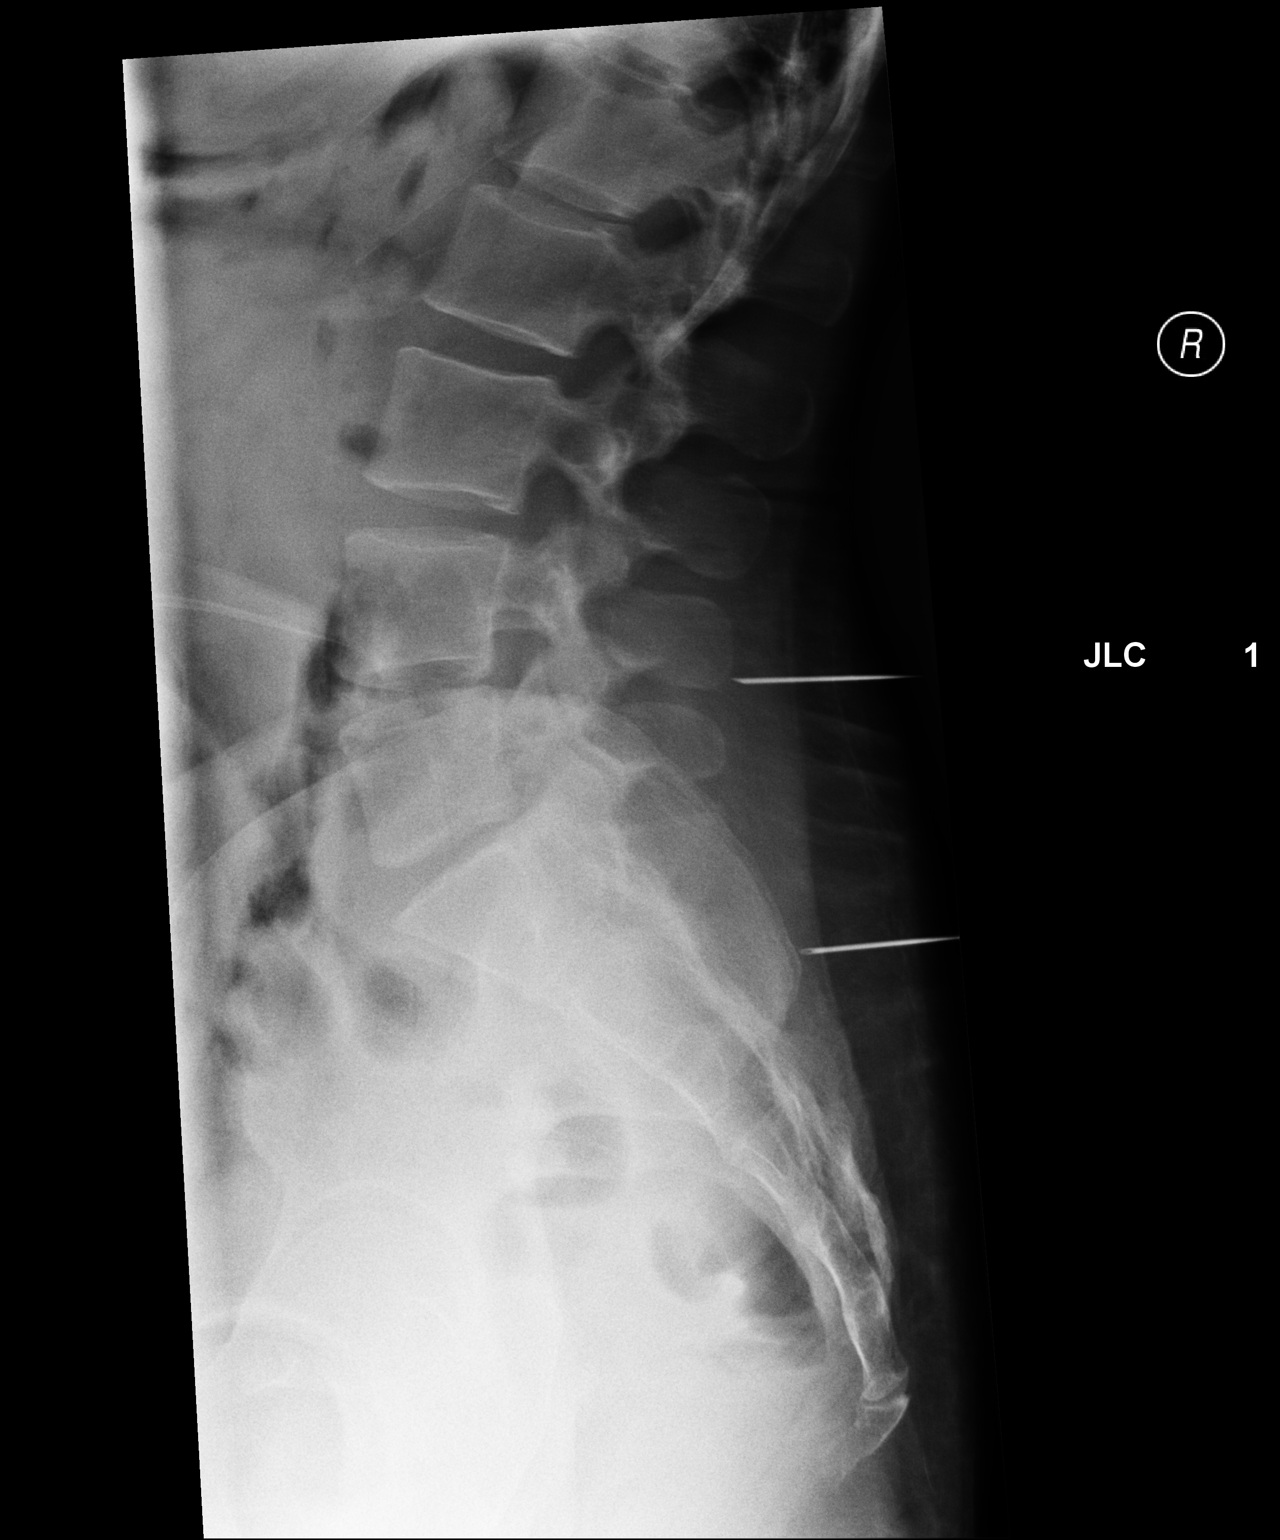

[lateral (2 of 2)]
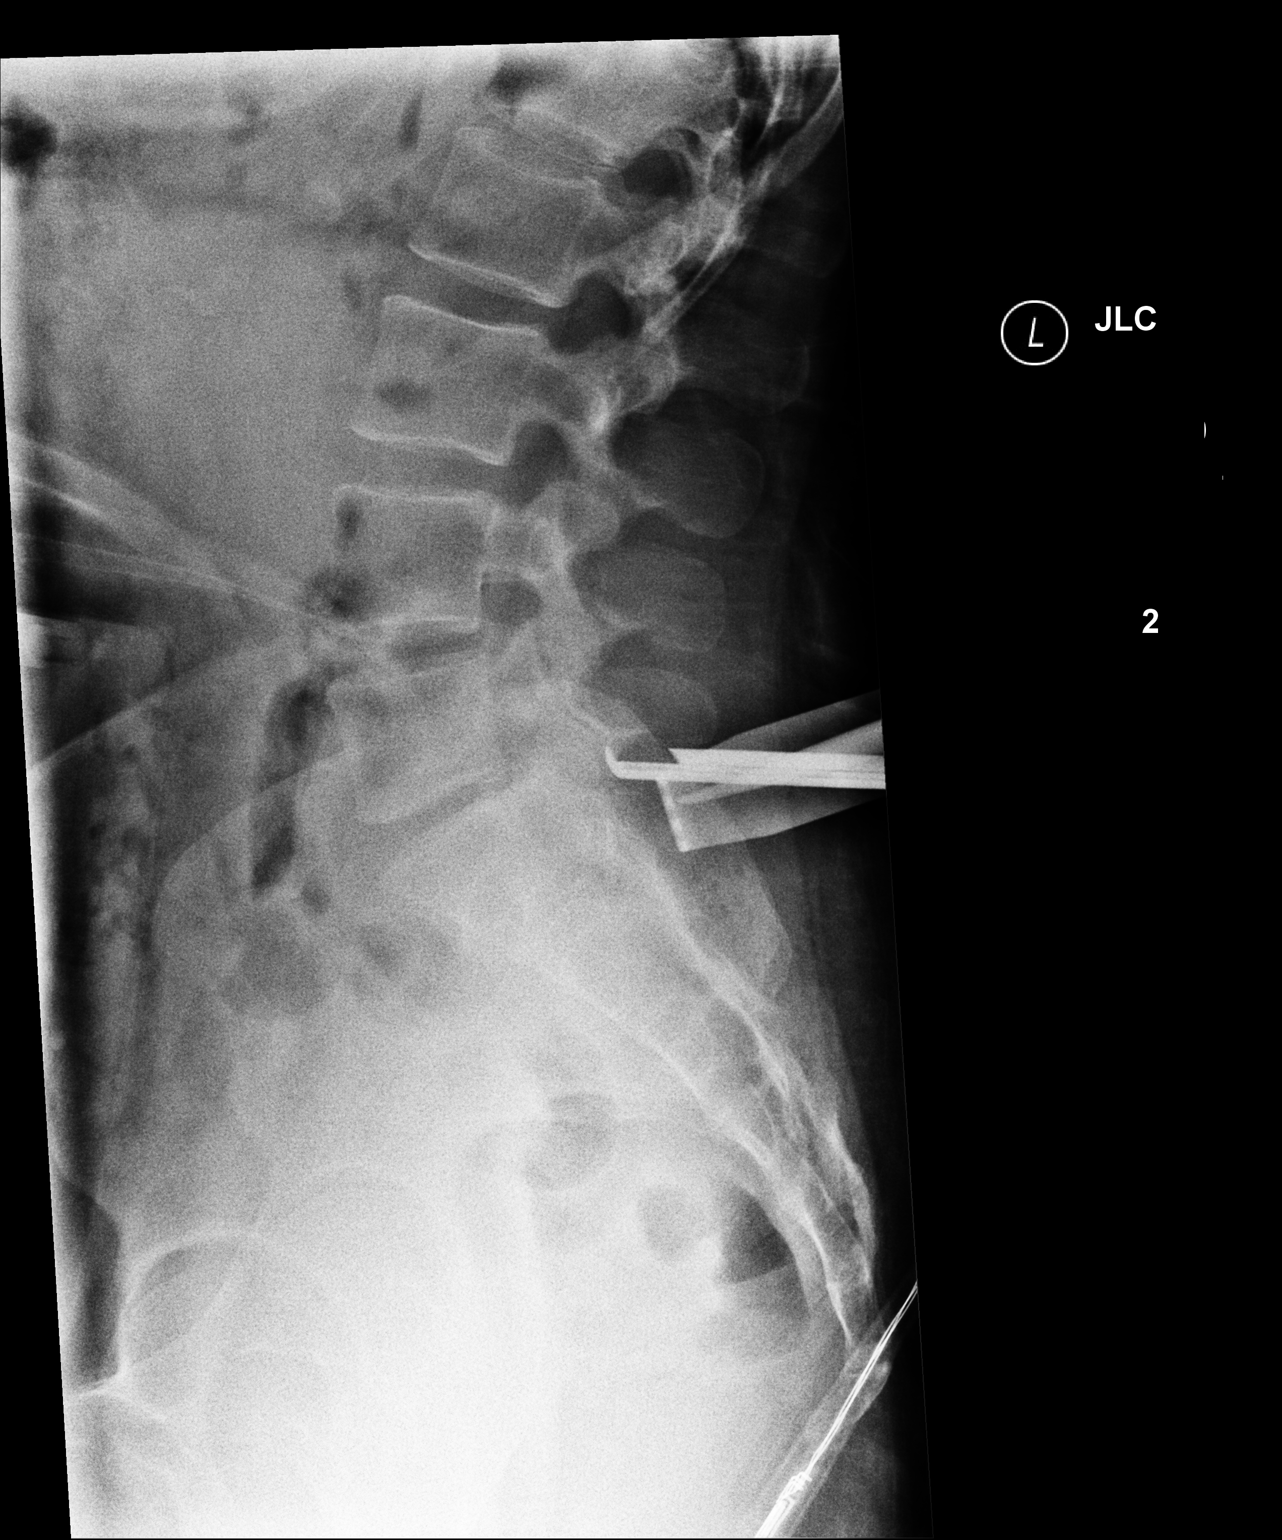

[2 of 2 positions shown; findings below may reference images not displayed]

FINDINGS: Image number 1 demonstrates localizer needle along the inferior
aspect of the L4 spinous process. Second needle directed at the S2
level.

Image number 2 demonstrates tissue spreaders below the L5 spinous
process. Surgical instrument under the lamina of L5.
IMPRESSION: Lumbar localization L5-S1.

## 2020-09-29 SURGERY — LUMBAR LAMINECTOMY/DECOMPRESSION MICRODISCECTOMY
Anesthesia: General | Laterality: Right

## 2020-09-29 MED ORDER — MIDAZOLAM HCL 2 MG/2ML IJ SOLN
INTRAMUSCULAR | Status: DC | PRN
  Administered 2020-09-29: 2 mg via INTRAVENOUS

## 2020-09-29 MED ORDER — PROMETHAZINE HCL 25 MG/ML IJ SOLN: 6.2500 mg | INTRAMUSCULAR | Status: DC | PRN

## 2020-09-29 MED ORDER — PHENYLEPHRINE 40 MCG/ML (10ML) SYRINGE FOR IV PUSH (FOR BLOOD PRESSURE SUPPORT)
PREFILLED_SYRINGE | INTRAVENOUS | Status: AC
  Filled 2020-09-29: qty 10

## 2020-09-29 MED ORDER — ONDANSETRON HCL 4 MG/2ML IJ SOLN
INTRAMUSCULAR | Status: DC | PRN
  Administered 2020-09-29: 4 mg via INTRAVENOUS

## 2020-09-29 MED ORDER — CEFAZOLIN SODIUM-DEXTROSE 2-4 GM/100ML-% IV SOLN
2.0000 g | INTRAVENOUS | Status: AC
  Administered 2020-09-29: 2 g via INTRAVENOUS

## 2020-09-29 MED ORDER — LIDOCAINE 2% (20 MG/ML) 5 ML SYRINGE
INTRAMUSCULAR | Status: AC
  Filled 2020-09-29: qty 5

## 2020-09-29 MED ORDER — OXYCODONE HCL 5 MG/5ML PO SOLN
5.0000 mg | Freq: Once | ORAL | Status: DC | PRN
Start: 2020-09-29 — End: 2020-09-29

## 2020-09-29 MED ORDER — POVIDONE-IODINE 7.5 % EX SOLN: Freq: Once | CUTANEOUS | Status: DC

## 2020-09-29 MED ORDER — ACETAMINOPHEN 10 MG/ML IV SOLN
INTRAVENOUS | Status: AC
  Filled 2020-09-29: qty 100

## 2020-09-29 MED ORDER — SUGAMMADEX SODIUM 200 MG/2ML IV SOLN
INTRAVENOUS | Status: DC | PRN
  Administered 2020-09-29: 200 mg via INTRAVENOUS

## 2020-09-29 MED ORDER — PHENYLEPHRINE HCL-NACL 20-0.9 MG/250ML-% IV SOLN
INTRAVENOUS | Status: DC | PRN
  Administered 2020-09-29: 50 ug/min via INTRAVENOUS

## 2020-09-29 MED ORDER — EPHEDRINE SULFATE-NACL 50-0.9 MG/10ML-% IV SOSY
PREFILLED_SYRINGE | INTRAVENOUS | Status: DC | PRN
  Administered 2020-09-29: 10 mg via INTRAVENOUS

## 2020-09-29 MED ORDER — HEMOSTATIC AGENTS (NO CHARGE) OPTIME
TOPICAL | Status: DC | PRN
  Administered 2020-09-29: 1 via TOPICAL

## 2020-09-29 MED ORDER — OXYCODONE HCL 5 MG PO TABS
5.0000 mg | ORAL_TABLET | Freq: Once | ORAL | Status: DC | PRN
Start: 2020-09-29 — End: 2020-09-29

## 2020-09-29 MED ORDER — METHYLENE BLUE 0.5 % INJ SOLN
INTRAVENOUS | Status: AC
  Filled 2020-09-29: qty 10

## 2020-09-29 MED ORDER — AMISULPRIDE (ANTIEMETIC) 5 MG/2ML IV SOLN
10.0000 mg | Freq: Once | INTRAVENOUS | Status: AC
  Administered 2020-09-29: 10 mg via INTRAVENOUS

## 2020-09-29 MED ORDER — HYDROCODONE-ACETAMINOPHEN 5-325 MG PO TABS: 1.0000 | ORAL_TABLET | Freq: Four times a day (QID) | ORAL | 0 refills | Status: DC | PRN

## 2020-09-29 MED ORDER — CHLORHEXIDINE GLUCONATE 0.12 % MT SOLN
OROMUCOSAL | Status: AC
  Administered 2020-09-29: 15 mL via OROMUCOSAL
  Filled 2020-09-29: qty 15

## 2020-09-29 MED ORDER — METHYLPREDNISOLONE ACETATE 40 MG/ML IJ SUSP
INTRAMUSCULAR | Status: DC | PRN
  Administered 2020-09-29: 40 mg

## 2020-09-29 MED ORDER — PROPOFOL 10 MG/ML IV BOLUS
INTRAVENOUS | Status: DC | PRN
  Administered 2020-09-29: 200 mg via INTRAVENOUS

## 2020-09-29 MED ORDER — PHENYLEPHRINE 40 MCG/ML (10ML) SYRINGE FOR IV PUSH (FOR BLOOD PRESSURE SUPPORT)
PREFILLED_SYRINGE | INTRAVENOUS | Status: DC | PRN
  Administered 2020-09-29 (×5): 80 ug via INTRAVENOUS

## 2020-09-29 MED ORDER — 0.9 % SODIUM CHLORIDE (POUR BTL) OPTIME
TOPICAL | Status: DC | PRN
  Administered 2020-09-29: 1000 mL

## 2020-09-29 MED ORDER — LACTATED RINGERS IV SOLN: INTRAVENOUS | Status: DC

## 2020-09-29 MED ORDER — BUPIVACAINE-EPINEPHRINE (PF) 0.25% -1:200000 IJ SOLN
INTRAMUSCULAR | Status: AC
  Filled 2020-09-29: qty 30

## 2020-09-29 MED ORDER — ROCURONIUM BROMIDE 10 MG/ML (PF) SYRINGE
PREFILLED_SYRINGE | INTRAVENOUS | Status: DC | PRN
  Administered 2020-09-29: 50 mg via INTRAVENOUS

## 2020-09-29 MED ORDER — METHYLENE BLUE 0.5 % INJ SOLN
INTRAVENOUS | Status: DC | PRN
  Administered 2020-09-29: 1 mL via SUBMUCOSAL

## 2020-09-29 MED ORDER — LIDOCAINE 2% (20 MG/ML) 5 ML SYRINGE
INTRAMUSCULAR | Status: DC | PRN
  Administered 2020-09-29: 60 mg via INTRAVENOUS

## 2020-09-29 MED ORDER — MIDAZOLAM HCL 2 MG/2ML IJ SOLN
INTRAMUSCULAR | Status: AC
  Filled 2020-09-29: qty 2

## 2020-09-29 MED ORDER — FENTANYL CITRATE (PF) 250 MCG/5ML IJ SOLN
INTRAMUSCULAR | Status: AC
  Filled 2020-09-29: qty 5

## 2020-09-29 MED ORDER — METHYLPREDNISOLONE ACETATE 40 MG/ML IJ SUSP
INTRAMUSCULAR | Status: AC
  Filled 2020-09-29: qty 1

## 2020-09-29 MED ORDER — SCOPOLAMINE 1 MG/3DAYS TD PT72
MEDICATED_PATCH | TRANSDERMAL | Status: AC
  Filled 2020-09-29: qty 1

## 2020-09-29 MED ORDER — CHLORHEXIDINE GLUCONATE 0.12 % MT SOLN: 15.0000 mL | Freq: Once | OROMUCOSAL | Status: AC

## 2020-09-29 MED ORDER — SCOPOLAMINE 1 MG/3DAYS TD PT72
MEDICATED_PATCH | TRANSDERMAL | Status: DC | PRN
  Administered 2020-09-29: 1 via TRANSDERMAL

## 2020-09-29 MED ORDER — FENTANYL CITRATE (PF) 100 MCG/2ML IJ SOLN
INTRAMUSCULAR | Status: AC
  Filled 2020-09-29: qty 2

## 2020-09-29 MED ORDER — EPHEDRINE 5 MG/ML INJ
INTRAVENOUS | Status: AC
  Filled 2020-09-29: qty 5

## 2020-09-29 MED ORDER — ORAL CARE MOUTH RINSE: 15.0000 mL | Freq: Once | OROMUCOSAL | Status: AC

## 2020-09-29 MED ORDER — FENTANYL CITRATE (PF) 100 MCG/2ML IJ SOLN
25.0000 ug | INTRAMUSCULAR | Status: DC | PRN
  Administered 2020-09-29 (×2): 25 ug via INTRAVENOUS

## 2020-09-29 MED ORDER — ROCURONIUM BROMIDE 10 MG/ML (PF) SYRINGE
PREFILLED_SYRINGE | INTRAVENOUS | Status: AC
  Filled 2020-09-29: qty 10

## 2020-09-29 MED ORDER — ACETAMINOPHEN 10 MG/ML IV SOLN
INTRAVENOUS | Status: DC | PRN
  Administered 2020-09-29: 1000 mg via INTRAVENOUS

## 2020-09-29 MED ORDER — BUPIVACAINE LIPOSOME 1.3 % IJ SUSP
INTRAMUSCULAR | Status: AC
  Filled 2020-09-29: qty 20

## 2020-09-29 MED ORDER — THROMBIN (RECOMBINANT) 20000 UNITS EX SOLR
CUTANEOUS | Status: AC
  Filled 2020-09-29: qty 20000

## 2020-09-29 MED ORDER — FENTANYL CITRATE (PF) 250 MCG/5ML IJ SOLN
INTRAMUSCULAR | Status: DC | PRN
  Administered 2020-09-29: 100 ug via INTRAVENOUS

## 2020-09-29 MED ORDER — AMISULPRIDE (ANTIEMETIC) 5 MG/2ML IV SOLN
INTRAVENOUS | Status: AC
  Filled 2020-09-29: qty 4

## 2020-09-29 MED ORDER — THROMBIN 20000 UNITS EX SOLR
CUTANEOUS | Status: DC | PRN
  Administered 2020-09-29: 20 mL via TOPICAL

## 2020-09-29 MED ORDER — CEFAZOLIN SODIUM-DEXTROSE 2-4 GM/100ML-% IV SOLN
INTRAVENOUS | Status: AC
  Filled 2020-09-29: qty 100

## 2020-09-29 MED ORDER — BUPIVACAINE LIPOSOME 1.3 % IJ SUSP
INTRAMUSCULAR | Status: DC | PRN
  Administered 2020-09-29: 20 mL

## 2020-09-29 MED ORDER — BUPIVACAINE-EPINEPHRINE 0.25% -1:200000 IJ SOLN
INTRAMUSCULAR | Status: DC | PRN
  Administered 2020-09-29: 20 mL
  Administered 2020-09-29: 10 mL

## 2020-09-29 MED ORDER — ONDANSETRON HCL 4 MG/2ML IJ SOLN
INTRAMUSCULAR | Status: AC
  Filled 2020-09-29: qty 2

## 2020-09-29 MED ORDER — METHOCARBAMOL 500 MG PO TABS
500.0000 mg | ORAL_TABLET | Freq: Four times a day (QID) | ORAL | 0 refills | Status: DC | PRN
Start: 2020-09-29 — End: 2021-03-02

## 2020-09-29 MED ORDER — PROPOFOL 10 MG/ML IV BOLUS
INTRAVENOUS | Status: AC
  Filled 2020-09-29: qty 20

## 2020-09-29 SURGICAL SUPPLY — 66 items
BAG COUNTER SPONGE SURGICOUNT (BAG) ×2 IMPLANT
BENZOIN TINCTURE PRP APPL 2/3 (GAUZE/BANDAGES/DRESSINGS) ×4 IMPLANT
BNDG GAUZE ELAST 4 BULKY (GAUZE/BANDAGES/DRESSINGS) ×2 IMPLANT
BUR ROUND PRECISION 4.0 (BURR) ×2 IMPLANT
CABLE BIPOLOR RESECTION CORD (MISCELLANEOUS) ×2 IMPLANT
CANISTER SUCT 3000ML PPV (MISCELLANEOUS) ×2 IMPLANT
CARTRIDGE OIL MAESTRO DRILL (MISCELLANEOUS) ×1 IMPLANT
CLSR STERI-STRIP ANTIMIC 1/2X4 (GAUZE/BANDAGES/DRESSINGS) ×2 IMPLANT
COVER SURGICAL LIGHT HANDLE (MISCELLANEOUS) ×2 IMPLANT
DIFFUSER DRILL AIR PNEUMATIC (MISCELLANEOUS) ×2 IMPLANT
DRAPE POUCH INSTRU U-SHP 10X18 (DRAPES) ×4 IMPLANT
DRAPE SURG 17X23 STRL (DRAPES) ×8 IMPLANT
DURAPREP 26ML APPLICATOR (WOUND CARE) ×2 IMPLANT
ELECT BLADE 4.0 EZ CLEAN MEGAD (MISCELLANEOUS) ×2
ELECT CAUTERY BLADE 6.4 (BLADE) ×2 IMPLANT
ELECT REM PT RETURN 9FT ADLT (ELECTROSURGICAL) ×2
ELECTRODE BLDE 4.0 EZ CLN MEGD (MISCELLANEOUS) ×1 IMPLANT
ELECTRODE REM PT RTRN 9FT ADLT (ELECTROSURGICAL) ×1 IMPLANT
EVACUATOR SILICONE 100CC (DRAIN) IMPLANT
FILTER STRAW FLUID ASPIR (MISCELLANEOUS) ×2 IMPLANT
GAUZE 4X4 16PLY ~~LOC~~+RFID DBL (SPONGE) ×4 IMPLANT
GAUZE SPONGE 4X4 12PLY STRL (GAUZE/BANDAGES/DRESSINGS) ×2 IMPLANT
GLOVE SRG 8 PF TXTR STRL LF DI (GLOVE) ×1 IMPLANT
GLOVE SURG ENC MOIS LTX SZ7 (GLOVE) ×2 IMPLANT
GLOVE SURG ENC MOIS LTX SZ8 (GLOVE) ×2 IMPLANT
GLOVE SURG UNDER POLY LF SZ7 (GLOVE) ×2 IMPLANT
GLOVE SURG UNDER POLY LF SZ8 (GLOVE) ×2
GOWN STRL REUS W/ TWL LRG LVL3 (GOWN DISPOSABLE) ×1 IMPLANT
GOWN STRL REUS W/ TWL XL LVL3 (GOWN DISPOSABLE) ×2 IMPLANT
GOWN STRL REUS W/TWL LRG LVL3 (GOWN DISPOSABLE) ×2
GOWN STRL REUS W/TWL XL LVL3 (GOWN DISPOSABLE) ×4
IV CATH 14GX2 1/4 (CATHETERS) ×2 IMPLANT
KIT BASIN OR (CUSTOM PROCEDURE TRAY) ×2 IMPLANT
KIT POSITION SURG JACKSON T1 (MISCELLANEOUS) ×2 IMPLANT
KIT TURNOVER KIT B (KITS) ×2 IMPLANT
MARKER SKIN DUAL TIP RULER LAB (MISCELLANEOUS) ×2 IMPLANT
NEEDLE 18GX1X1/2 (RX/OR ONLY) (NEEDLE) ×2 IMPLANT
NEEDLE 22X1 1/2 (OR ONLY) (NEEDLE) ×2 IMPLANT
NEEDLE HYPO 25GX1X1/2 BEV (NEEDLE) ×2 IMPLANT
NEEDLE SPNL 18GX3.5 QUINCKE PK (NEEDLE) ×4 IMPLANT
NS IRRIG 1000ML POUR BTL (IV SOLUTION) ×2 IMPLANT
OIL CARTRIDGE MAESTRO DRILL (MISCELLANEOUS) ×2
PACK LAMINECTOMY ORTHO (CUSTOM PROCEDURE TRAY) ×2 IMPLANT
PACK UNIVERSAL I (CUSTOM PROCEDURE TRAY) ×2 IMPLANT
PAD ARMBOARD 7.5X6 YLW CONV (MISCELLANEOUS) ×4 IMPLANT
PATTIES SURGICAL .5 X.5 (GAUZE/BANDAGES/DRESSINGS) ×2 IMPLANT
SPONGE INTESTINAL PEANUT (DISPOSABLE) ×6 IMPLANT
SPONGE SURGIFOAM ABS GEL SZ50 (HEMOSTASIS) ×2 IMPLANT
STRIP CLOSURE SKIN 1/2X4 (GAUZE/BANDAGES/DRESSINGS) ×2 IMPLANT
SURGIFLO W/THROMBIN 8M KIT (HEMOSTASIS) ×2 IMPLANT
SUT MNCRL AB 4-0 PS2 18 (SUTURE) ×2 IMPLANT
SUT VIC AB 0 CT1 18XCR BRD 8 (SUTURE) ×1 IMPLANT
SUT VIC AB 0 CT1 8-18 (SUTURE) ×2
SUT VIC AB 1 CT1 18XCR BRD 8 (SUTURE) ×1 IMPLANT
SUT VIC AB 1 CT1 8-18 (SUTURE) ×2
SUT VIC AB 2-0 CT2 18 VCP726D (SUTURE) ×2 IMPLANT
SYR 20ML LL LF (SYRINGE) ×2 IMPLANT
SYR BULB IRRIG 60ML STRL (SYRINGE) ×2 IMPLANT
SYR CONTROL 10ML LL (SYRINGE) ×4 IMPLANT
SYR TB 1ML 25GX5/8 (SYRINGE) ×4 IMPLANT
SYR TB 1ML LUER SLIP (SYRINGE) ×4 IMPLANT
TAPE CLOTH SURG 4X10 WHT LF (GAUZE/BANDAGES/DRESSINGS) ×2 IMPLANT
TOWEL GREEN STERILE (TOWEL DISPOSABLE) ×2 IMPLANT
TOWEL GREEN STERILE FF (TOWEL DISPOSABLE) ×2 IMPLANT
WATER STERILE IRR 1000ML POUR (IV SOLUTION) ×2 IMPLANT
YANKAUER SUCT BULB TIP NO VENT (SUCTIONS) ×2 IMPLANT

## 2020-09-29 NOTE — Anesthesia Procedure Notes (Signed)
Procedure Name: Intubation Date/Time: 09/29/2020 12:51 PM Performed by: Shary Decamp, CRNA Pre-anesthesia Checklist: Patient identified, Patient being monitored, Timeout performed, Emergency Drugs available and Suction available Patient Re-evaluated:Patient Re-evaluated prior to induction Oxygen Delivery Method: Circle System Utilized Preoxygenation: Pre-oxygenation with 100% oxygen Induction Type: IV induction Ventilation: Mask ventilation without difficulty Laryngoscope Size: Miller and 2 Grade View: Grade I Tube type: Oral Tube size: 7.0 mm Number of attempts: 1 Airway Equipment and Method: Stylet Placement Confirmation: ETT inserted through vocal cords under direct vision, positive ETCO2 and breath sounds checked- equal and bilateral Secured at: 21 cm Tube secured with: Tape Dental Injury: Teeth and Oropharynx as per pre-operative assessment

## 2020-09-29 NOTE — Anesthesia Preprocedure Evaluation (Addendum)
Anesthesia Evaluation  Patient identified by MRN, date of birth, ID band Patient awake    Reviewed: Allergy & Precautions, NPO status , Patient's Chart, lab work & pertinent test results  History of Anesthesia Complications Negative for: history of anesthetic complications  Airway Mallampati: II  TM Distance: >3 FB Neck ROM: Full    Dental  (+) Dental Advisory Given, Teeth Intact   Pulmonary neg pulmonary ROS,    Pulmonary exam normal        Cardiovascular negative cardio ROS Normal cardiovascular exam     Neuro/Psych PSYCHIATRIC DISORDERS Anxiety negative neurological ROS     GI/Hepatic negative GI ROS, Neg liver ROS,   Endo/Other   Obesity Goiter   Renal/GU negative Renal ROS     Musculoskeletal  (+) Arthritis ,   Abdominal   Peds  (+) ADHD Hematology negative hematology ROS (+)   Anesthesia Other Findings   Reproductive/Obstetrics                            Anesthesia Physical Anesthesia Plan  ASA: 2  Anesthesia Plan: General   Post-op Pain Management:    Induction: Intravenous  PONV Risk Score and Plan: 4 or greater and Treatment may vary due to age or medical condition, Ondansetron, Midazolam, Dexamethasone and Scopolamine patch - Pre-op  Airway Management Planned: Oral ETT  Additional Equipment: None  Intra-op Plan:   Post-operative Plan: Extubation in OR  Informed Consent: I have reviewed the patients History and Physical, chart, labs and discussed the procedure including the risks, benefits and alternatives for the proposed anesthesia with the patient or authorized representative who has indicated his/her understanding and acceptance.     Dental advisory given  Plan Discussed with: CRNA and Anesthesiologist  Anesthesia Plan Comments:        Anesthesia Quick Evaluation

## 2020-09-29 NOTE — Transfer of Care (Signed)
Immediate Anesthesia Transfer of Care Note  Patient: Jocelyn Taylor  Procedure(s) Performed: RIGHT-SIDED LUMBAR 5 - SACRUM 1 MICRODISECTOMY (Right)  Patient Location: PACU  Anesthesia Type:General  Level of Consciousness: drowsy, patient cooperative and responds to stimulation  Airway & Oxygen Therapy: Patient Spontanous Breathing and Patient connected to nasal cannula oxygen  Post-op Assessment: Report given to RN, Post -op Vital signs reviewed and stable and Patient moving all extremities X 4  Post vital signs: Reviewed and stable  Last Vitals:  Vitals Value Taken Time  BP    Temp    Pulse 95 09/29/20 1438  Resp 16 09/29/20 1438  SpO2 100 % 09/29/20 1438  Vitals shown include unvalidated device data.  Last Pain:  Vitals:   09/29/20 1049  TempSrc:   PainSc: 3          Complications: No notable events documented.

## 2020-09-29 NOTE — H&P (Signed)
PREOPERATIVE H&P  Chief Complaint: Right leg pain  HPI: Jocelyn Taylor is a 35 y.o. female who presents with ongoing pain in the right leg  MRI reveals a moderate right L5/S1 HNP, compressing the right S1 nerve  Patient has failed multiple forms of conservative care and continues to have pain (see office notes for additional details regarding the patient's full course of treatment)  Past Medical History:  Diagnosis Date   ADHD (attention deficit hyperactivity disorder)    Goiter    Past Surgical History:  Procedure Laterality Date   BREAST BIOPSY Right 06/2006   HEMORRHOID SURGERY  2020   TUBAL LIGATION Bilateral    Social History   Socioeconomic History   Marital status: Married    Spouse name: Not on file   Number of children: Not on file   Years of education: Not on file   Highest education level: Not on file  Occupational History   Not on file  Tobacco Use   Smoking status: Never   Smokeless tobacco: Never  Vaping Use   Vaping Use: Never used  Substance and Sexual Activity   Alcohol use: Yes   Drug use: No   Sexual activity: Yes  Other Topics Concern   Not on file  Social History Narrative   Not on file   Social Determinants of Health   Financial Resource Strain: Not on file  Food Insecurity: Not on file  Transportation Needs: Not on file  Physical Activity: Not on file  Stress: Not on file  Social Connections: Not on file   Family History  Problem Relation Age of Onset   Cancer Mother        ovarian   No Known Allergies Prior to Admission medications   Medication Sig Start Date End Date Taking? Authorizing Provider  ibuprofen (ADVIL) 800 MG tablet Take 1 tablet (800 mg total) by mouth every 8 (eight) hours as needed. Patient taking differently: Take 800 mg by mouth every 8 (eight) hours as needed for moderate pain. 06/18/20  Yes Monica Becton, MD  Menthol, Topical Analgesic, (BIOFREEZE) 10 % LIQD Apply 1 application topically  daily as needed (pain).   Yes [provider]  methylphenidate (CONCERTA) 27 MG PO CR tablet Take 1 tablet (27 mg total) by mouth every morning. 09/02/20  Yes Breeback, Jade L, PA-C  Multiple Vitamins-Minerals (MULTIVITAMIN WITH MINERALS) tablet Take 1 tablet by mouth daily.   Yes [provider]  valACYclovir (VALTREX) 1000 MG tablet Take 1 tablet (1,000 mg total) by mouth 2 (two) times daily. Take 2 tablets twice a day for one day for outbreak. Patient taking differently: Take 1,000 mg by mouth 2 (two) times daily as needed (outbreak). Take 2 tablets twice a day for one day for outbreak. 09/01/20  Yes Breeback, Jade L, PA-C  zinc gluconate 50 MG tablet Take 50 mg by mouth daily.   Yes [provider]  gabapentin (NEURONTIN) 300 MG capsule One tab PO qHS for a week, then BID for a week, then TID. May double weekly to a max of 3,600mg /day Patient not taking: Reported on 09/20/2020 07/20/20   Monica Becton, MD  methylphenidate (CONCERTA) 27 MG PO CR tablet Take 1 tablet (27 mg total) by mouth every morning. Patient not taking: Reported on 09/20/2020 05/07/20   Jomarie Longs, PA-C  methylphenidate (CONCERTA) 27 MG PO CR tablet Take 1 tablet (27 mg total) by mouth every morning. Patient not taking: Reported on  09/20/2020 06/07/20   Jomarie Longs, PA-C  orphenadrine (NORFLEX) 100 MG tablet Take 1 tablet (100 mg total) by mouth 2 (two) times daily as needed for muscle spasms. Patient not taking: Reported on 09/20/2020 02/23/20   Jomarie Longs, PA-C  predniSONE (STERAPRED UNI-PAK 48 TAB) 10 MG (48) TBPK tablet Take by mouth daily. 12-day taper pack, use as directed for taper Patient not taking: Reported on 09/20/2020 07/22/20   Monica Becton, MD  traMADol (ULTRAM) 50 MG tablet Take 1-2 tablets (50-100 mg total) by mouth every 8 (eight) hours as needed for moderate pain. Maximum 6 tabs per day. Patient not taking: Reported on 09/20/2020 07/23/20   Monica Becton, MD      All other systems have been reviewed and were otherwise negative with the exception of those mentioned in the HPI and as above.  Physical Exam: There were no vitals filed for this visit.  There is no height or weight on file to calculate BMI.  General: Alert, no acute distress Cardiovascular: No pedal edema Respiratory: No cyanosis, no use of accessory musculature Skin: No lesions in the area of chief complaint Neurologic: Sensation intact distally Psychiatric: Patient is competent for consent with normal mood and affect Lymphatic: No axillary or cervical lymphadenopathy  MUSCULOSKELETAL: + SLR on the right  Assessment/Plan: RIGHT SACRUM 1 RADICULOPATHY Plan for Procedure(s): RIGHT-SIDED LUMBAR 5 - SACRUM 1 MICRODISECTOMY   Jackelyn Hoehn, MD 09/29/2020 8:29 AM

## 2020-09-29 NOTE — Op Note (Signed)
PATIENT NAME: Jocelyn Taylor [facility]   MEDICAL RECORD NO.:   [id number]    DATE OF BIRTH: 10-19-1985   DATE OF PROCEDURE: 09/29/2020                               OPERATIVE REPORT     PREOPERATIVE DIAGNOSES: 1. Right-sided S1 radiculopathy. 2. Moderate right-sided L5-S1 disk herniation causing severe     compression of the right S1 nerve.   POSTOPERATIVE DIAGNOSES: 1. Right-sided S1 radiculopathy. 2. Moderate right-sided L5-S1 disk herniation causing severe     compression of the right S1 nerve.   PROCEDURES:  Right-sided L5-S1 laminotomy with partial facetectomy and removal of large herniated right-sided L5-S1 disk fragment.   SURGEON:  Estill Bamberg, MD.   ASSISTANTJason Coop, PA-C.   ANESTHESIA:  General endotracheal anesthesia.   COMPLICATIONS:  None.   DISPOSITION:  Stable.   ESTIMATED BLOOD LOSS:  Minimal.   INDICATIONS FOR SURGERY:  Briefly, Ms. Parrillo is a pleasant 35 year old female, who did present to me with severe pain in the right leg.  The patient's MRI did reveal the findings outlined above, clearly notable for a herniated disk fragment. The pain was rather Severe. We did discuss treatment options and we did ultimately elect to proceed with the procedure reflected above. The patient was fully made aware of the risks of surgery, including the risk of recurrent herniation and the need for subsequent surgery, including the possibility of a subsequent diskectomy and/or fusion.   OPERATIVE DETAILS:  On 09/29/2020, the patient was brought to Surgery and general endotracheal anesthesia was administered.  The patient was placed prone on a well-padded flat Jackson bed with a spinal frame.  Antibiotics were given.  The back was prepped and draped and a time-out procedure was performed.  At this point, a midline incision was made directly over the L5-S1 intervertebral space.  A curvilinear incision was made just to the right of the midline into the fascia.   A self-retaining McCulloch retractor was placed.  The lamina of L5 and S1 was identified and subperiosteally exposed.  I then removed the lateral aspect of the L5-S1 ligamentum flavum.  Readily identified was the traversing right S1 nerve, which was noted to be under obvious tension, and was noted to be rather erythematous.  I was able to gently gain medial retraction of the nerve, and in doing so, a large herniated disk fragments.  I then noticed an additional protrusion, and made an annulotomy at the posterolateral aspect of the intervertebral disc space.  Using micropituitary rongeurs, I did remove additional disc material immediately ventral to the annulus, additionally decompressing the lateral recess.  I did liberally irrigate the intervertebral disc in order to identify an additional loose fragments requiring removal.  Small fragments were additionally noted, and were removed uneventfully.  I was very pleased with the final decompression that I was able to accomplish.  At this point, the wound was copiously irrigated with normal saline.  All epidural bleeding was controlled using bipolar electrocautery in addition to Surgiflo. All bleeding was controlled at the termination of the procedure.  At this point, 20 mg of Depo-Medrol was introduced about the epidural space in the region of the right S1 nerve.  The wound was then closed in layers using #1 Vicryl followed by 0 Vicryl, followed by 4-0 Monocryl. Benzoin and Steri-Strips were applied followed by a sterile dressing. All instrument counts were correct  at the termination of the procedure.   Of note, Jason Coop was my assistant throughout surgery, and did aid in retraction, suctioning, and closure from start to finish.         Estill Bamberg, MD

## 2020-09-30 ENCOUNTER — Encounter (HOSPITAL_COMMUNITY): Payer: Self-pay | Admitting: Orthopedic Surgery

## 2020-09-30 MED FILL — Thrombin (Recombinant) For Soln 20000 Unit: CUTANEOUS | Qty: 1 | Status: AC

## 2020-09-30 NOTE — Anesthesia Postprocedure Evaluation (Signed)
Anesthesia Post Note  Patient: Jocelyn Taylor  Procedure(s) Performed: RIGHT-SIDED LUMBAR 5 - SACRUM 1 MICRODISECTOMY (Right)     Patient location during evaluation: PACU Anesthesia Type: General Level of consciousness: awake and alert Pain management: pain level controlled Vital Signs Assessment: post-procedure vital signs reviewed and stable Respiratory status: spontaneous breathing, nonlabored ventilation and respiratory function stable Cardiovascular status: stable and blood pressure returned to baseline Anesthetic complications: no   No notable events documented.  Last Vitals:  Vitals:   09/29/20 1510 09/29/20 1525  BP: 122/85 117/87  Pulse: 80 75  Resp: 16 17  Temp:  (!) 36.1 C  SpO2: 100% 100%    Last Pain:  Vitals:   09/29/20 1510  TempSrc:   PainSc: 2                  Beryle Lathe

## 2020-10-01 ENCOUNTER — Ambulatory Visit: Payer: Managed Care, Other (non HMO) | Admitting: Podiatry

## 2020-10-14 ENCOUNTER — Telehealth: Payer: Managed Care, Other (non HMO) | Admitting: Nurse Practitioner

## 2020-10-14 DIAGNOSIS — L299 Pruritus, unspecified: Secondary | ICD-10-CM

## 2020-10-14 NOTE — Progress Notes (Signed)
Based on what you shared with me it looks like you have pruritis ( itching ),that should be evaluated in a face to face office visit. You will need  to be evaluated in a face to face visit in order to treat. In the mean time try taking benadryl OTC.  NOTE: There will be NO CHARGE for this eVisit   If you are having a true medical emergency please call 911.      For an urgent face to face visit, [facility] has six urgent care centers for your convenience:     [facility] at [facility] Directions [phone] [address] [place], [place] [address]    [facility]) Get Driving Directions [phone] [address] [place], [place] [address]  [facility]) Get Driving Directions [phone] [address] [place],  [place]  [address]  [facility] at [facility] Get Driving Directions [phone] [address] [place], [place] [address]   [facility] at [facility] Get Driving Directions  [phone] [address] [place], [place] [address]   [facility] Health Urgent Care at [facility] Directions [phone] [address] [place], [place] [address]  Your MyChart E-visit questionnaire answers were reviewed by a board certified advanced clinical practitioner to complete your personal care plan based on your specific symptoms.  Thank you for using e-Visits.

## 2020-11-01 ENCOUNTER — Other Ambulatory Visit: Payer: Self-pay

## 2020-11-01 ENCOUNTER — Encounter: Payer: Self-pay | Admitting: Podiatry

## 2020-11-01 ENCOUNTER — Telehealth: Payer: Self-pay | Admitting: Podiatry

## 2020-11-01 ENCOUNTER — Ambulatory Visit (INDEPENDENT_AMBULATORY_CARE_PROVIDER_SITE_OTHER): Payer: Managed Care, Other (non HMO) | Admitting: Podiatry

## 2020-11-01 DIAGNOSIS — M7751 Other enthesopathy of right foot: Secondary | ICD-10-CM

## 2020-11-01 DIAGNOSIS — M216X1 Other acquired deformities of right foot: Secondary | ICD-10-CM | POA: Diagnosis not present

## 2020-11-01 DIAGNOSIS — L723 Sebaceous cyst: Secondary | ICD-10-CM | POA: Insufficient documentation

## 2020-11-01 DIAGNOSIS — M216X2 Other acquired deformities of left foot: Secondary | ICD-10-CM | POA: Diagnosis not present

## 2020-11-01 NOTE — Telephone Encounter (Signed)
Left message for pt that the  dx code for the orthotics for her to call insurance to verify is M21.6x1 and to call once she checks insurance and let me know if she is wanting to proceed.

## 2020-11-03 NOTE — Progress Notes (Signed)
Subjective: 35 year old female presents the office today initially for follow-up evaluation of right foot pain.  She states that overall she is doing better but she wants to consider custom orthotics.  She is tried over-the-counter inserts which have been helpful.  Still gets some discomfort of the right foot on the ball of her foot.  Otherwise she been doing well.  Ankle pain is improved as well as the sciatica.  She not having back surgery since I saw her last and been improving.  Objective: AAO x3, NAD DP/PT pulses palpable bilaterally, CRT less than 3 seconds There is mild tenderness on the first MPJ of the right foot there is localized edema and faint erythema.  No ascending cellulitis.  No drainage or pus.  No increased warmth to the area.  There is no skin breakdown.  There is discomfort with MPJ range of motion.  the metatarsal head plantarly.  No other areas of discomfort identified.  No significant pain at ankle.  MMT 5/5. No pain with calf compression, swelling, warmth, erythema  Assessment: Capsulitis right first MPJ  Plan: -All treatment options discussed with the patient including all alternatives, risks, complications.  -At this point given her ongoing discomfort she was to consider custom inserts.  She was measured for inserts today but she can check with insurance coverage prior to ordering.  Now continue with shoes with good arch support.  She can use anti-inflammatories, Voltaren gel as needed.  Consider steroid injection if symptoms continue as well.  Return in 4 weeks (on 11/29/2020) for pick up orthotics .  Vivi Barrack DPM

## 2020-11-04 ENCOUNTER — Telehealth: Payer: Self-pay | Admitting: Podiatry

## 2020-11-04 NOTE — Telephone Encounter (Signed)
Pt left message yesterday afternoon stating she called the insurance company with the 2nd code I left and they will cover the orthotics with that code so she wants to proceed. But she would like orthotics for her dress shoes.   I called pt and confirmed we are going to proceed with the orthotics.

## 2020-12-17 ENCOUNTER — Other Ambulatory Visit: Payer: Self-pay

## 2020-12-17 ENCOUNTER — Ambulatory Visit (INDEPENDENT_AMBULATORY_CARE_PROVIDER_SITE_OTHER): Payer: Managed Care, Other (non HMO) | Admitting: Podiatry

## 2020-12-17 DIAGNOSIS — M7751 Other enthesopathy of right foot: Secondary | ICD-10-CM | POA: Diagnosis not present

## 2020-12-17 DIAGNOSIS — M79671 Pain in right foot: Secondary | ICD-10-CM

## 2020-12-17 MED ORDER — TRIAMCINOLONE ACETONIDE 10 MG/ML IJ SUSP
10.0000 mg | Freq: Once | INTRAMUSCULAR | Status: AC
  Administered 2020-12-17: 10 mg

## 2020-12-18 NOTE — Progress Notes (Signed)
Subjective: 35 year old female presents the office to pick up orthotics but also she is interested in steroid injection.  She still gets pain on the first MPJ on her right foot point on the medial aspect.  She states the anti-inflammatories that she notices it helps her foot.  She denies any recent injury or trauma.  She states that her back is doing better but is having some sciatica pain.  Objective: AAO x3, NAD DP/PT pulses palpable bilaterally, CRT less than 3 seconds To refer the majority discomfort on the medial aspect of the first metatarsal head there is localized edema erythema inflammation.  There is no pain or crepitation with MPJ range of motion.  There is no other areas of pinpoint tenderness.  MMT 5/5. No pain with calf compression, swelling, warmth, erythema  Assessment: Capsulitis right foot  Plan: -All treatment options discussed with the patient including all alternatives, risks, complications.  -Steroid injection performed today after discussing risks of this.  Skin was cleaned with alcohol, Betadine.  Mixture of 1 cc Kenalog 10, 0.5 cc of Marcaine plain, 0.5 cc of lidocaine plain was infiltrated along the area of maximal tenderness on the first MPJ without complications.  Postinjection care discussed. -Orthotics were dispensed and oral and written break-in instructions were provided to the patient. -Offloading pad dispensed. -Patient encouraged to call the office with any questions, concerns, change in symptoms.   Vivi Barrack DPM

## 2021-01-05 ENCOUNTER — Encounter: Payer: Self-pay | Admitting: Podiatry

## 2021-01-18 ENCOUNTER — Other Ambulatory Visit: Payer: Self-pay | Admitting: Orthopedic Surgery

## 2021-01-18 ENCOUNTER — Telehealth: Payer: Self-pay | Admitting: Urology

## 2021-01-18 DIAGNOSIS — M545 Low back pain, unspecified: Secondary | ICD-10-CM

## 2021-01-18 NOTE — Telephone Encounter (Signed)
Pt says yall have been chatting via my chart and pt stats she would like a call from you do discuss some questions that she has. Please Advise.Thanks

## 2021-01-19 ENCOUNTER — Other Ambulatory Visit: Payer: Self-pay | Admitting: Podiatry

## 2021-01-19 DIAGNOSIS — M7751 Other enthesopathy of right foot: Secondary | ICD-10-CM

## 2021-01-28 ENCOUNTER — Other Ambulatory Visit: Payer: Self-pay

## 2021-01-28 ENCOUNTER — Ambulatory Visit
Admission: RE | Admit: 2021-01-28 | Discharge: 2021-01-28 | Disposition: A | Discharge status: Home / Self Care | Payer: Managed Care, Other (non HMO) | Source: Ambulatory Visit | Attending: Orthopedic Surgery | Admitting: Orthopedic Surgery

## 2021-01-28 DIAGNOSIS — M545 Low back pain, unspecified: Secondary | ICD-10-CM

## 2021-01-28 IMAGING — MR MR LUMBAR SPINE W/O CM
4 of 5 series · 27 of 48 positions shown · non-contrast
Comparison: MR lumbar [DATE]; X-ray lumbar [DATE].

CLINICAL DATA: Low back and right leg pain since [DATE].

EXAM:
MRI LUMBAR SPINE WITHOUT CONTRAST
TECHNIQUE: Multiplanar, multisequence MR imaging of the lumbar spine was
performed. No intravenous contrast was administered.

[Series 3: T2 · sagittal · 4.0mm · 1.09mm/px · 6 of 17 slices shown (1 of 2)]
[im 1/17]
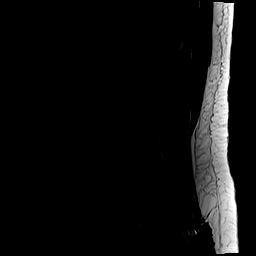
[im 4/17]
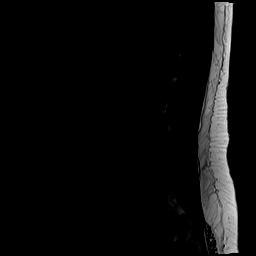
[im 7/17]
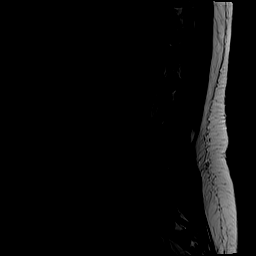
[im 10/17]
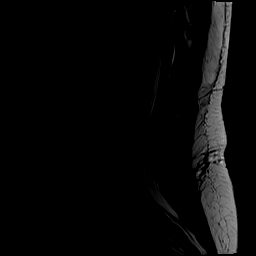
[im 13/17]
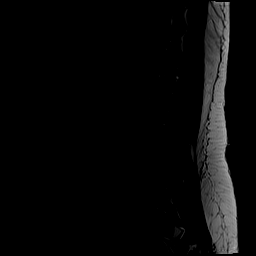
[im 17/17]
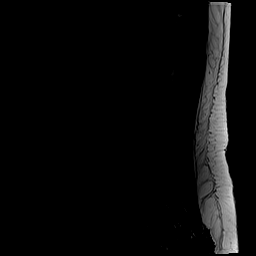

[Series 5: T1 · sagittal · 4.0mm · 1.09mm/px · 6 of 17 slices shown (1 of 2)]
[im 1/17]
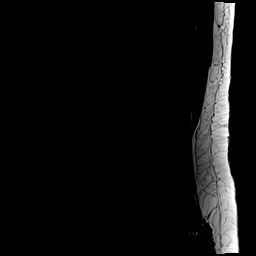
[im 4/17]
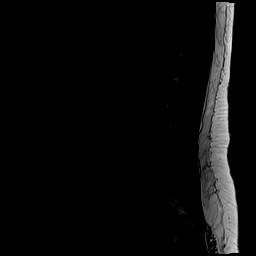
[im 7/17]
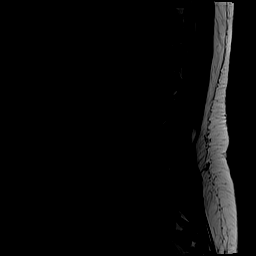
[im 10/17]
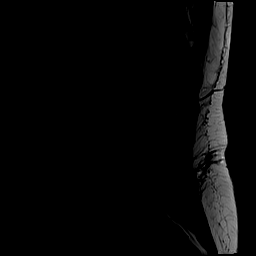
[im 13/17]
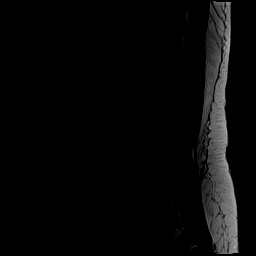
[im 17/17]
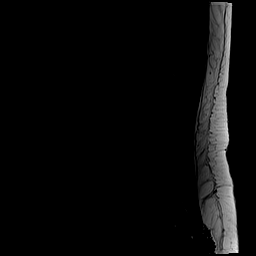

[Series 6: T2 · axial · 4.0mm · 0.39mm/px · z∈[-76,+142]mm · 9 of 41 slices shown (2 of 2)]
[im 1/41]
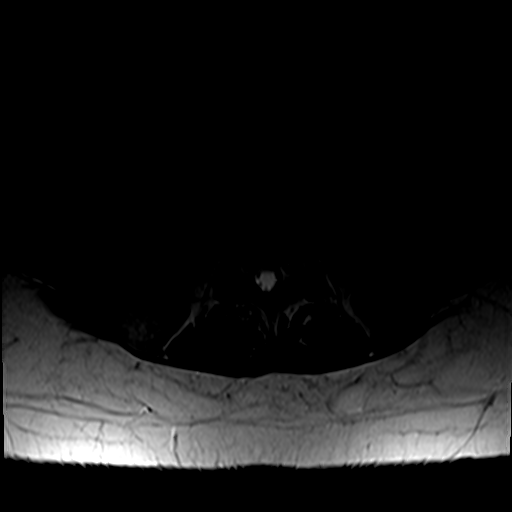
[im 6/41]
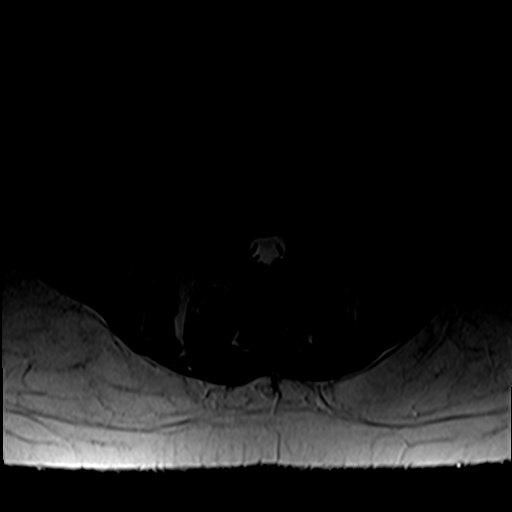
[im 12/41]
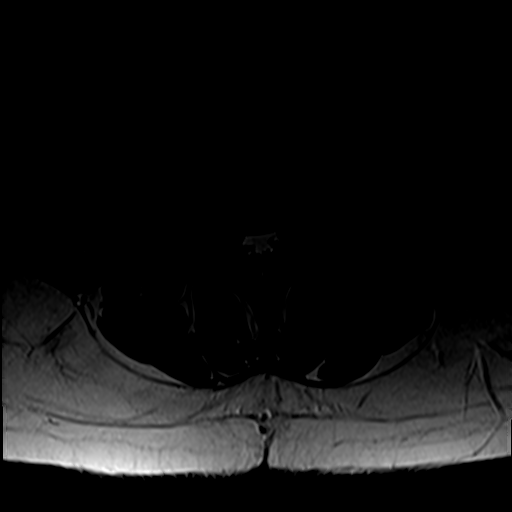
[im 18/41]
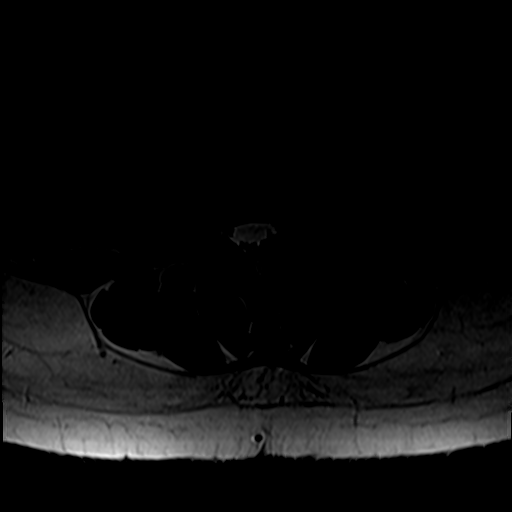
[im 21/41]
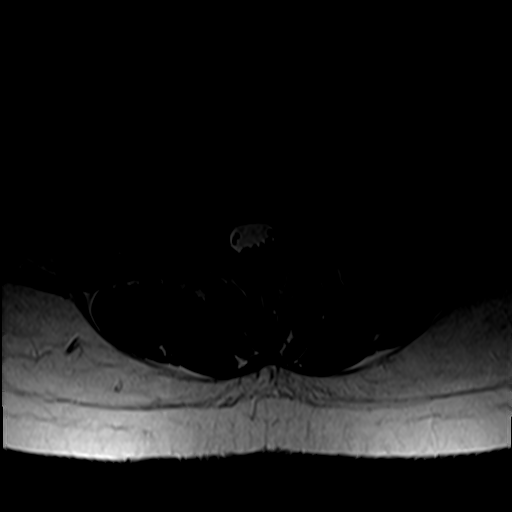
[im 23/41]
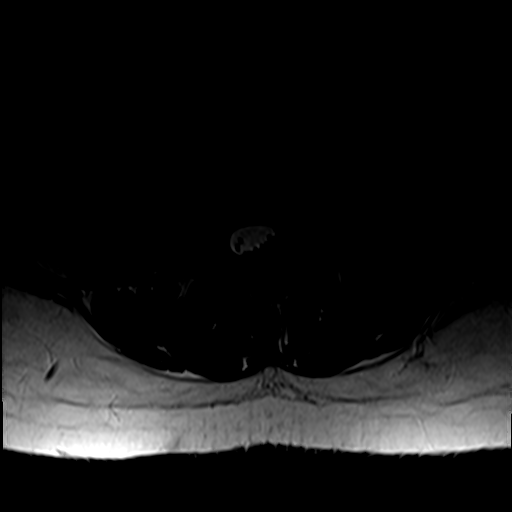
[im 29/41]
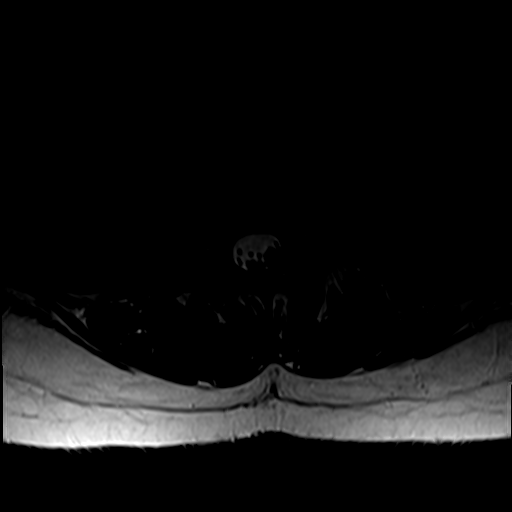
[im 35/41]
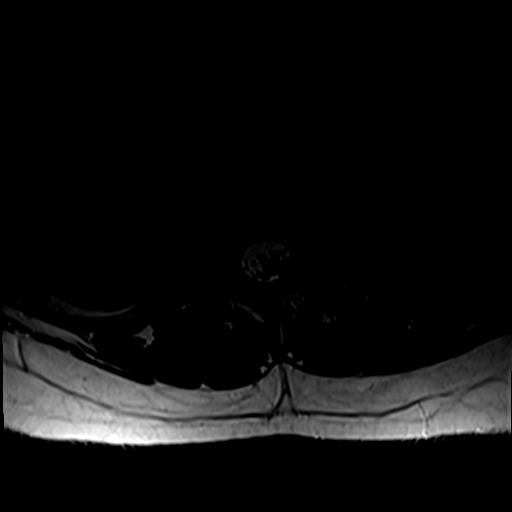
[im 41/41]
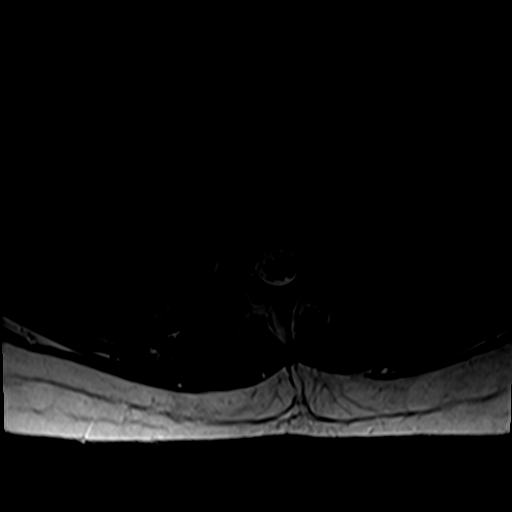

[Series 7: T1 · axial · 4.0mm · 0.39mm/px · z∈[-76,+114]mm · 6 of 41 slices shown (2 of 2)]
[im 1/41]
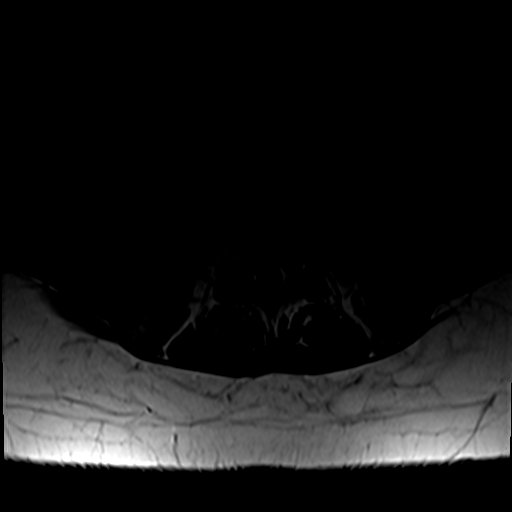
[im 6/41]
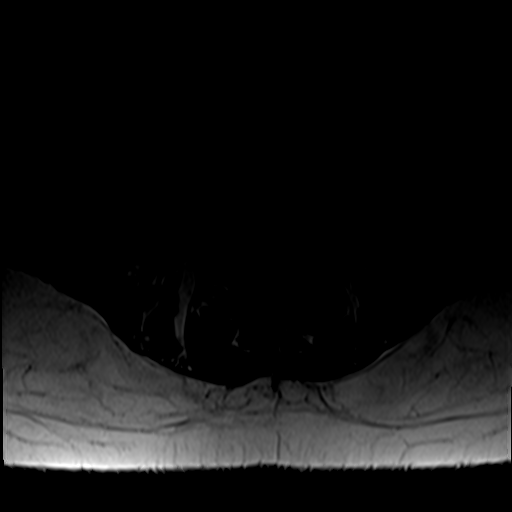
[im 12/41]
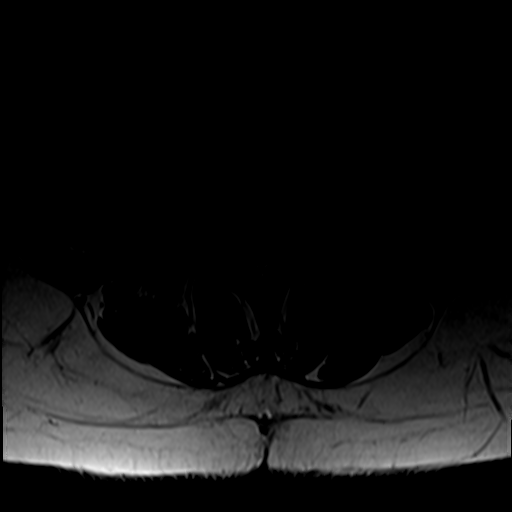
[im 18/41]
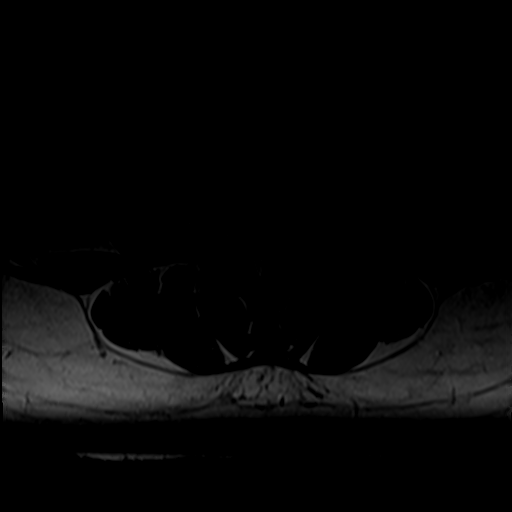
[im 21/41]
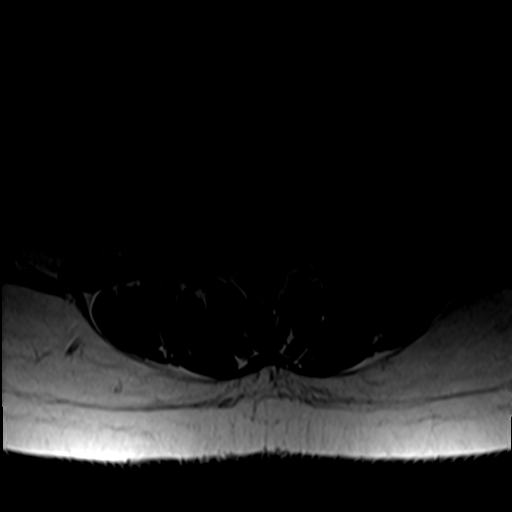
[im 35/41]
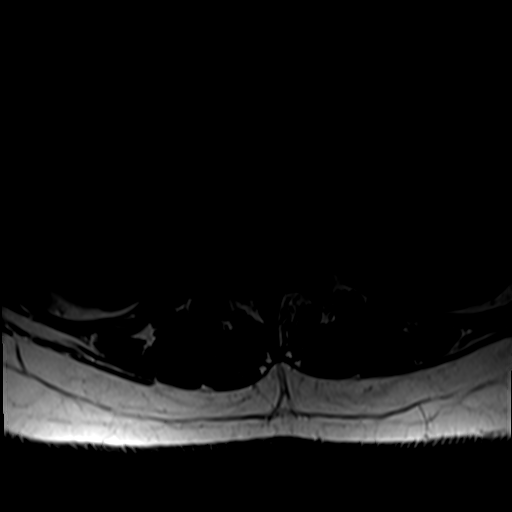

[27 of 48 positions shown; findings below may reference images not displayed]

FINDINGS: Segmentation: Standard.

Alignment: Physiologic.

Vertebrae: No fracture, evidence of discitis, or bone lesion.

Conus medullaris and cauda equina: Conus extends to the L1-L2 level.
Conus and cauda equina appear normal.

Paraspinal and other soft tissues: Negative.

Disc levels:

T11-T12: No significant spinal canal or neural foraminal narrowing.

T12-L1: No significant spinal canal or neural foraminal narrowing.

L1-L2: No significant spinal canal or neural foraminal narrowing.

L2-L3: No significant spinal canal or neural foraminal narrowing.

L3-L4: No significant spinal canal or neural foraminal narrowing.

L4-L5: Disc desiccation with broad-based disc bulging asymmetric to
the right. Mild bilateral facet arthropathy. No significant spinal
canal or neural foraminal stenosis.

L5-S1: Postsurgical changes of microdiscectomy. There is improved
but persistent asymmetric right disc bulging. There is regional low
intensity signal which could represent fibrotic change. No definite
impingement.
IMPRESSION: Postsurgical changes of micro discectomy at L5-S1. Improved but
persistent asymmetric right disc bulging with regional low signal
intensity signal tissue which could represent fibrotic change. No
definite nerve impingement.

## 2021-02-07 ENCOUNTER — Encounter: Payer: Managed Care, Other (non HMO) | Admitting: Physician Assistant

## 2021-02-08 ENCOUNTER — Encounter: Payer: Managed Care, Other (non HMO) | Admitting: Physician Assistant

## 2021-02-09 ENCOUNTER — Encounter: Payer: Managed Care, Other (non HMO) | Admitting: Physician Assistant

## 2021-02-09 LAB — RESULTS CONSOLE HPV: CHL HPV: NEGATIVE

## 2021-02-09 LAB — HM PAP SMEAR

## 2021-02-18 ENCOUNTER — Ambulatory Visit (INDEPENDENT_AMBULATORY_CARE_PROVIDER_SITE_OTHER): Payer: Managed Care, Other (non HMO)

## 2021-02-18 ENCOUNTER — Other Ambulatory Visit: Payer: Self-pay

## 2021-02-18 DIAGNOSIS — M7751 Other enthesopathy of right foot: Secondary | ICD-10-CM

## 2021-02-18 DIAGNOSIS — M79674 Pain in right toe(s): Secondary | ICD-10-CM | POA: Diagnosis not present

## 2021-02-18 IMAGING — DX DG FOOT COMPLETE 3+V*R*
3 series · 3 of 3 positions shown · non-contrast
Comparison: Right foot x-rays dated [DATE].

CLINICAL DATA: Great toe pain.  No injury.

EXAM:
RIGHT FOOT COMPLETE - 3+ VIEW

[foot ap wb]
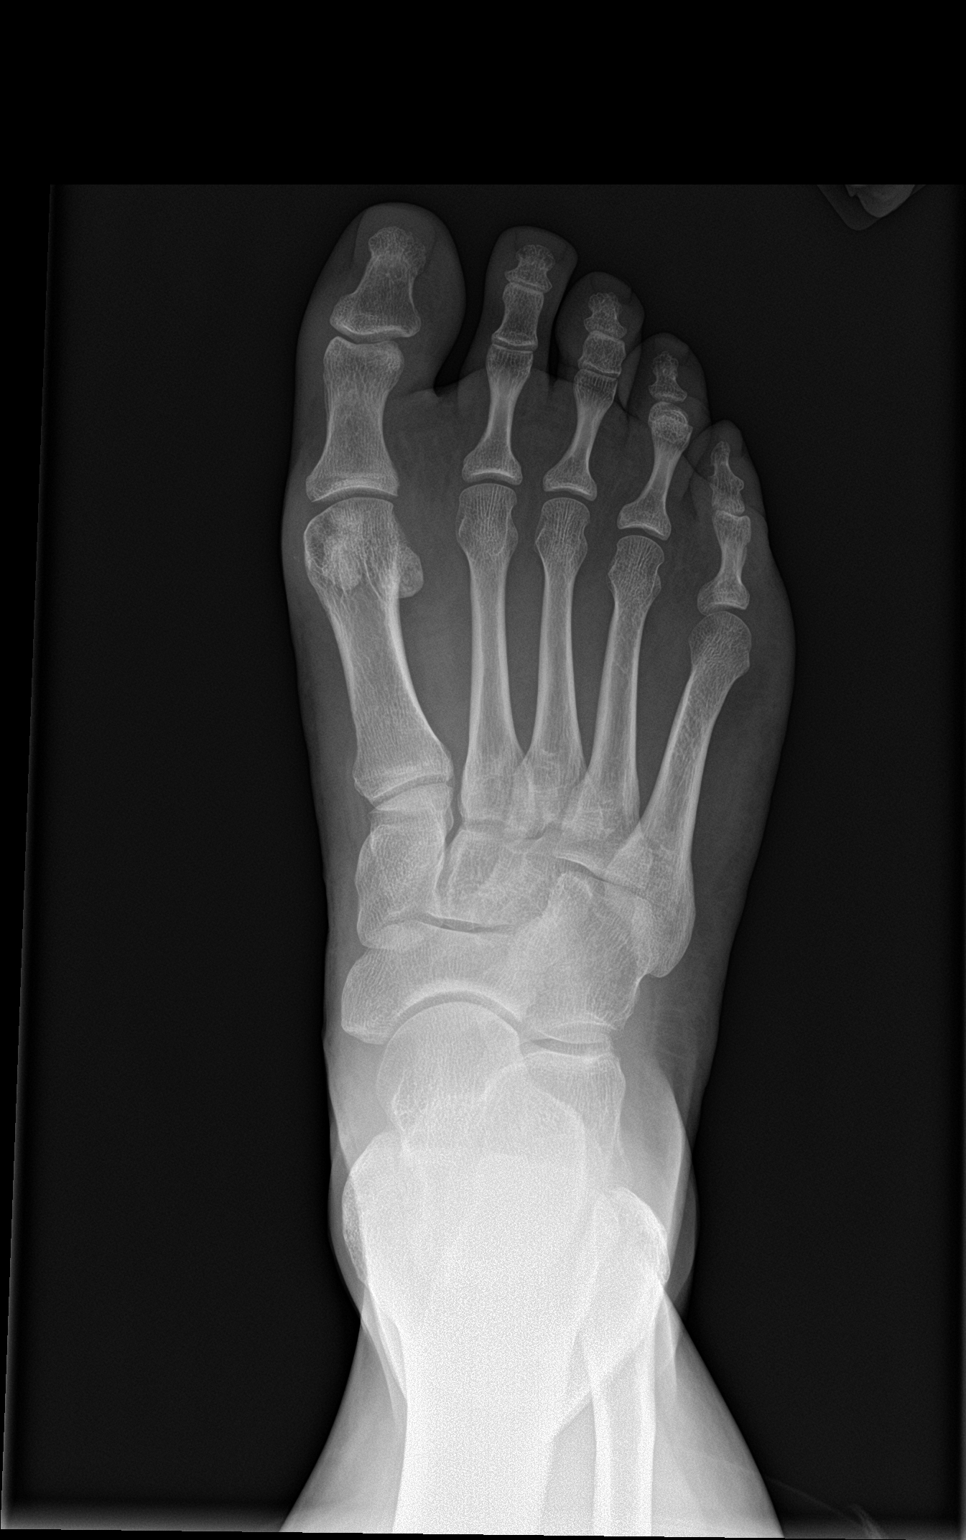

[foot obl wb]
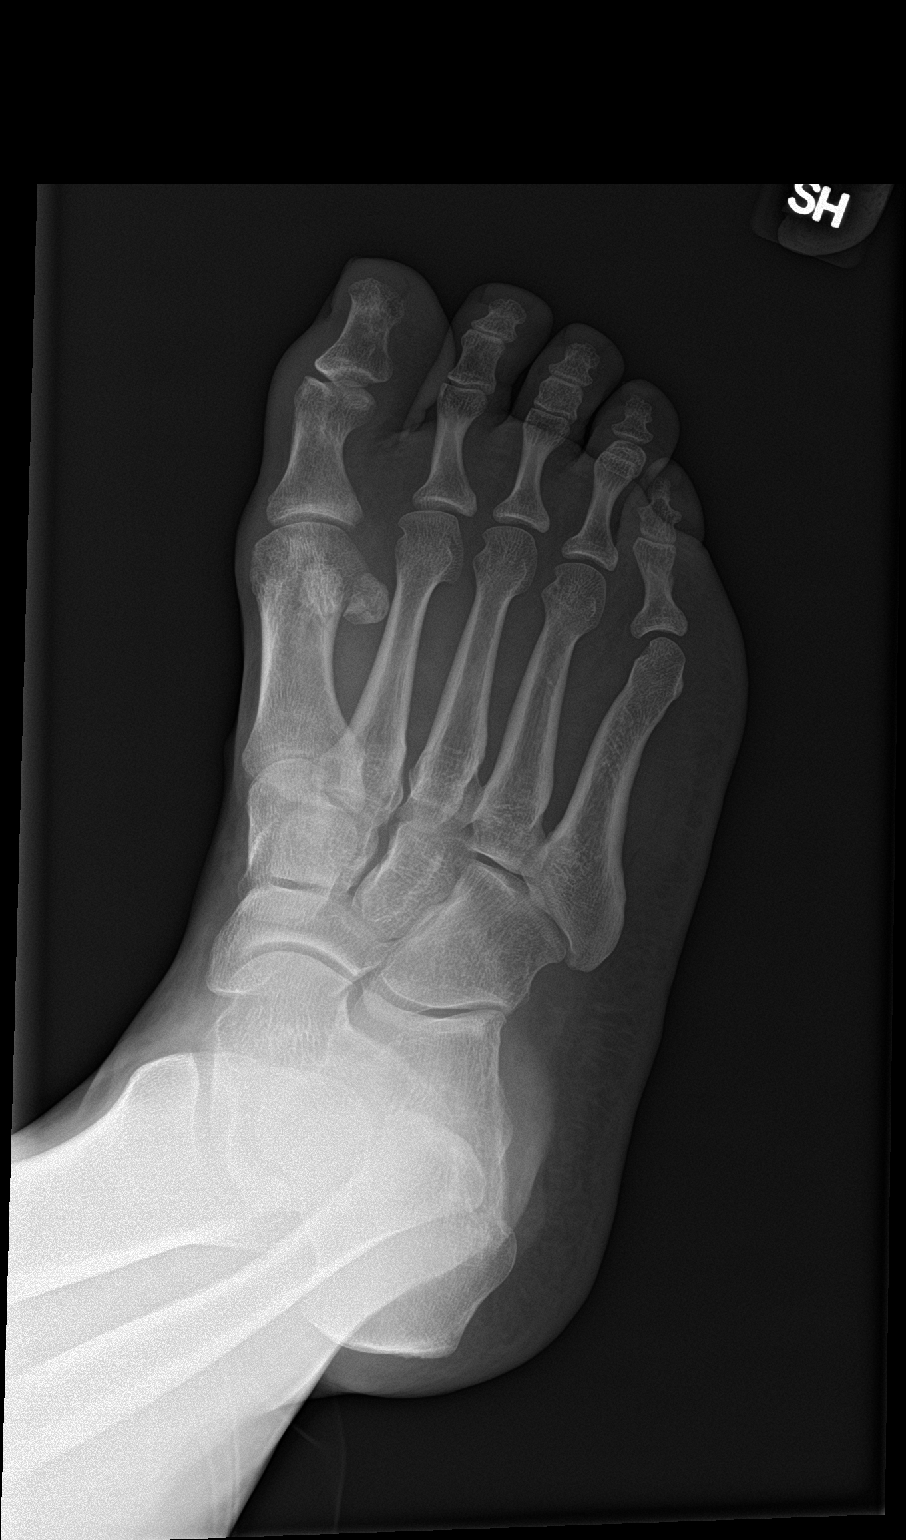

[foot lat wb]
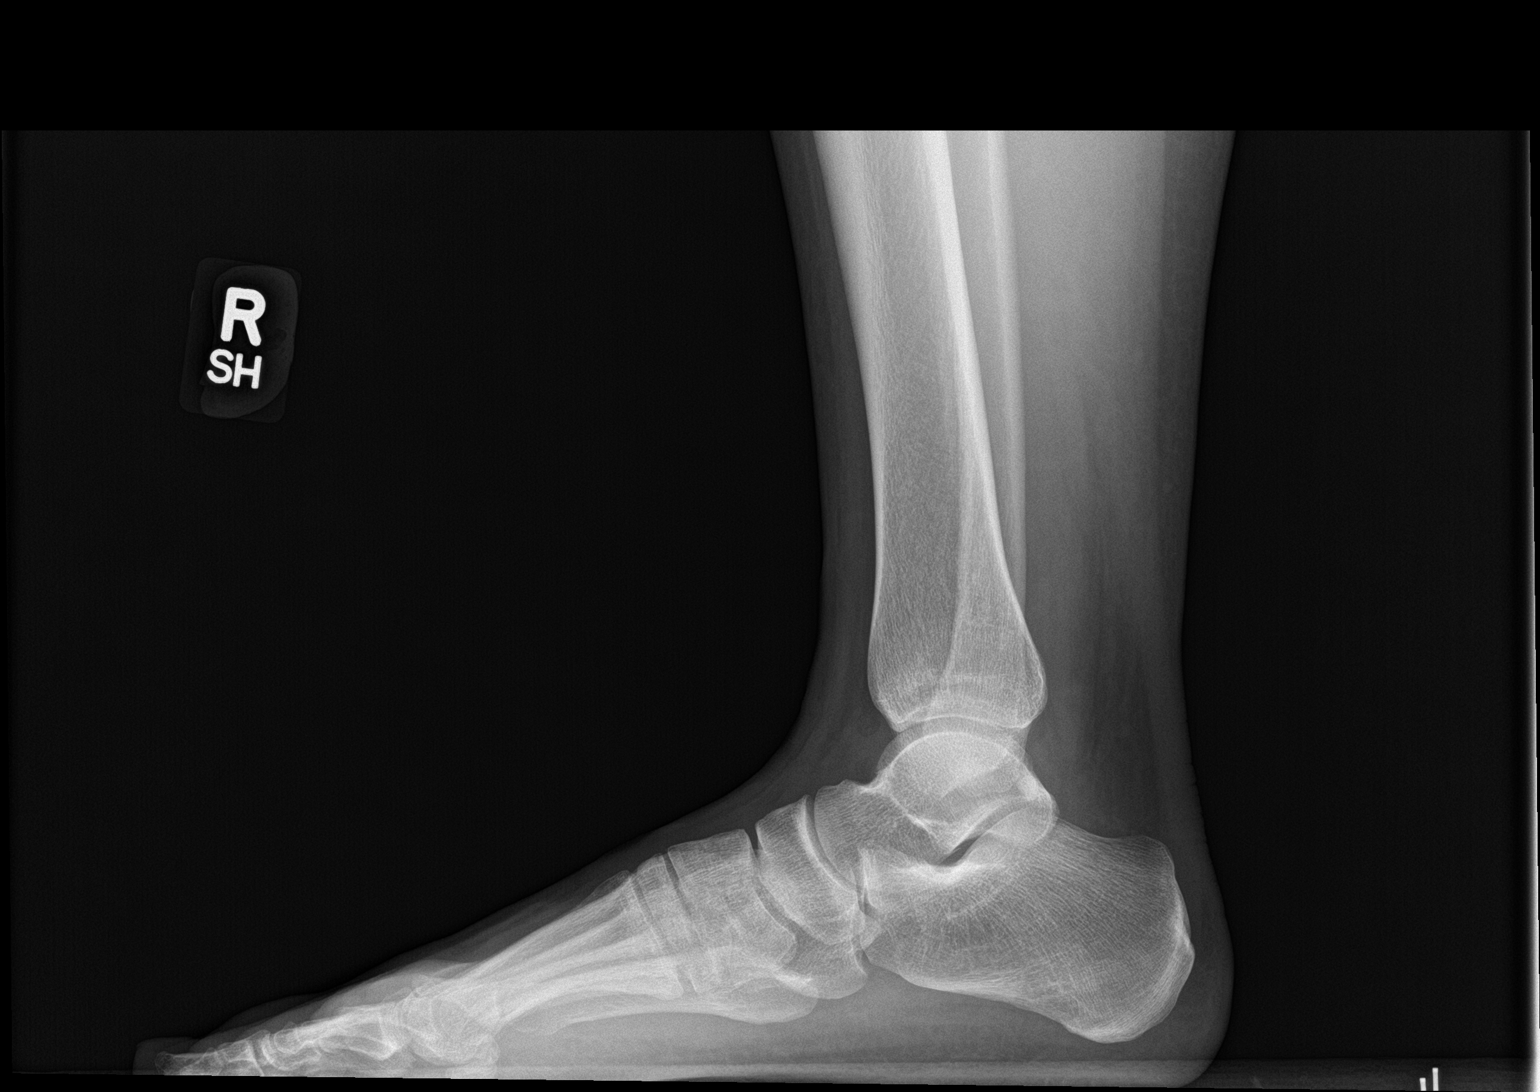

[3 of 3 positions shown; findings below may reference images not displayed]

FINDINGS: There is no evidence of fracture or dislocation. There is no
evidence of arthropathy or other focal bone abnormality. Soft
tissues are unremarkable.
IMPRESSION: Negative.

## 2021-03-02 ENCOUNTER — Encounter: Payer: Self-pay | Admitting: Podiatry

## 2021-03-02 ENCOUNTER — Ambulatory Visit (INDEPENDENT_AMBULATORY_CARE_PROVIDER_SITE_OTHER): Payer: Managed Care, Other (non HMO) | Admitting: Physician Assistant

## 2021-03-02 VITALS — BP 122/80 | HR 95 | Ht 65.0 in | Wt 200.8 lb

## 2021-03-02 DIAGNOSIS — Z131 Encounter for screening for diabetes mellitus: Secondary | ICD-10-CM

## 2021-03-02 DIAGNOSIS — E063 Autoimmune thyroiditis: Secondary | ICD-10-CM

## 2021-03-02 DIAGNOSIS — M25562 Pain in left knee: Secondary | ICD-10-CM

## 2021-03-02 DIAGNOSIS — Z0000 Encounter for general adult medical examination without abnormal findings: Secondary | ICD-10-CM

## 2021-03-02 DIAGNOSIS — Z13220 Encounter for screening for lipoid disorders: Secondary | ICD-10-CM

## 2021-03-02 DIAGNOSIS — E6609 Other obesity due to excess calories: Secondary | ICD-10-CM

## 2021-03-02 DIAGNOSIS — F909 Attention-deficit hyperactivity disorder, unspecified type: Secondary | ICD-10-CM

## 2021-03-02 DIAGNOSIS — Z6833 Body mass index (BMI) 33.0-33.9, adult: Secondary | ICD-10-CM

## 2021-03-02 DIAGNOSIS — M25561 Pain in right knee: Secondary | ICD-10-CM

## 2021-03-02 MED ORDER — METHYLPHENIDATE HCL ER (OSM) 27 MG PO TBCR: 27.0000 mg | EXTENDED_RELEASE_TABLET | ORAL | 0 refills | Status: DC

## 2021-03-02 NOTE — Patient Instructions (Addendum)
Turmeric 500mg  twice a day  Patellofemoral Pain Syndrome Patellofemoral pain syndrome is a condition in which the tissue (cartilage) on the underside of the kneecap (patella) softens or breaks down. This causes pain in the front of the knee. The condition is also called runner's knee or chondromalacia patella. Patellofemoral pain syndrome is most common in young adults who are active in sports. The knee is the largest joint in the body. The patella covers the front of the knee and is attached to muscles above and below the knee. The underside of the patella is covered with a smooth type of cartilage (synovium). The smooth surface helps the patella glide easily when you move your knee. Patellofemoral pain syndrome causes swelling in the joint linings and bone surfaces in the knee. What are the causes? This condition may be caused by: Overuse of the knee. Poor alignment of your knee joints. Weak leg muscles. A direct hit to your kneecap. What increases the risk? You are more likely to develop this condition if: You do a lot of activities that can wear down your kneecap. These include: Running. Squatting. Climbing stairs. You start a new physical activity or exercise program. You wear shoes that do not fit well. You do not have good leg strength. You are overweight. What are the signs or symptoms? The main symptom of this condition is knee pain. This may feel like a dull, aching pain underneath your patella, in the front of your knee. There may be a popping or cracking sound when you move your knee. Pain may get worse with: Exercise. Climbing stairs. Running. Jumping. Squatting. Kneeling. Sitting for a long time. Moving or pushing on your patella. How is this diagnosed? This condition may be diagnosed based on: Your symptoms and medical history. You may be asked about your recent physical activities and which ones cause knee pain. A physical exam. This may include: Moving your patella  back and forth. Checking your range of knee motion. Having you squat or jump to see if you have pain. Checking the strength of your leg muscles. Imaging tests to confirm the diagnosis. These may include an MRI of your knee. How is this treated? This condition may be treated at home with rest, ice, compression, and elevation (RICE).  Other treatments may include: NSAIDs, such as ibuprofen. Physical therapy to stretch and strengthen your leg muscles. Shoe inserts (orthotics) to take stress off your knee. A knee brace or knee support. Adhesive tapes to the skin. Surgery to remove damaged cartilage or move the patella to a better position. This is rare. Follow these instructions at home: If you have a brace: Wear the brace as told by your health care provider. Remove it only as told by your health care provider. Loosen the brace if your toes tingle, become numb, or turn cold and blue. Keep the brace clean. If the brace is not waterproof: Do not let it get wet. Cover it with a watertight covering when you take a bath or a shower. Managing pain, stiffness, and swelling  If directed, put ice on the painful area. To do this: If you have a removable brace, remove it as told by your health care provider. Put ice in a plastic bag. Place a towel between your skin and the bag. Leave the ice on for 20 minutes, 2-3 times a day. Remove the ice if your skin turns bright red. This is very important. If you cannot feel pain, heat, or cold, you have a greater risk  of damage to the area. Move your toes often to reduce stiffness and swelling. Raise (elevate) the injured area above the level of your heart while you are sitting or lying down. Activity Rest your knee. Avoid activities that cause knee pain. Perform stretching and strengthening exercises as told by your health care provider or physical therapist. Return to your normal activities as told by your health care provider. Ask your health care  provider what activities are safe for you. General instructions Take over-the-counter and prescription medicines only as told by your health care provider. Use splints, braces, knee supports, or walking aids as directed by your health care provider. Do not use any products that contain nicotine or tobacco, such as cigarettes, e-cigarettes, and chewing tobacco. These can delay healing. If you need help quitting, ask your health care provider. Keep all follow-up visits. This is important. Contact a health care provider if: Your symptoms get worse. You are not improving with home care. Summary Patellofemoral pain syndrome is a condition in which the tissue (cartilage) on the underside of the kneecap (patella) softens or breaks down. This condition causes swelling in the joint linings and bone surfaces in the knee. This leads to pain in the front of the knee. This condition may be treated at home with rest, ice, compression, and elevation (RICE). Use splints, braces, knee supports, or walking aids as directed by your health care provider. This information is not intended to replace advice given to you by your health care provider. Make sure you discuss any questions you have with your health care provider. Document Revised: 07/23/2019 Document Reviewed: 07/23/2019 Elsevier Patient Education  2022 [employer] Health Maintenance, Female Adopting a healthy lifestyle and getting preventive care are important in promoting health and wellness. Ask your health care provider about: The right schedule for you to have regular tests and exams. Things you can do on your own to prevent diseases and keep yourself healthy. What should I know about diet, weight, and exercise? Eat a healthy diet  Eat a diet that includes plenty of vegetables, fruits, low-fat dairy products, and lean protein. Do not eat a lot of foods that are high in solid fats, added sugars, or sodium. Maintain a healthy weight Body mass  index (BMI) is used to identify weight problems. It estimates body fat based on height and weight. Your health care provider can help determine your BMI and help you achieve or maintain a healthy weight. Get regular exercise Get regular exercise. This is one of the most important things you can do for your health. Most adults should: Exercise for at least 150 minutes each week. The exercise should increase your heart rate and make you sweat (moderate-intensity exercise). Do strengthening exercises at least twice a week. This is in addition to the moderate-intensity exercise. Spend less time sitting. Even light physical activity can be beneficial. Watch cholesterol and blood lipids Have your blood tested for lipids and cholesterol at 35 years of age, then have this test every 5 years. Have your cholesterol levels checked more often if: Your lipid or cholesterol levels are high. You are older than 35 years of age. You are at high risk for heart disease. What should I know about cancer screening? Depending on your health history and family history, you may need to have cancer screening at various ages. This may include screening for: Breast cancer. Cervical cancer. Colorectal cancer. Skin cancer. Lung cancer. What should I know about heart disease, diabetes, and high blood pressure?  Blood pressure and heart disease High blood pressure causes heart disease and increases the risk of stroke. This is more likely to develop in people who have high blood pressure readings or are overweight. Have your blood pressure checked: Every 3-5 years if you are 23-70 years of age. Every year if you are 25 years old or older. Diabetes Have regular diabetes screenings. This checks your fasting blood sugar level. Have the screening done: Once every three years after age 56 if you are at a normal weight and have a low risk for diabetes. More often and at a younger age if you are overweight or have a high risk  for diabetes. What should I know about preventing infection? Hepatitis B If you have a higher risk for hepatitis B, you should be screened for this virus. Talk with your health care provider to find out if you are at risk for hepatitis B infection. Hepatitis C Testing is recommended for: Everyone born from [birth year] through 1964. Anyone with known risk factors for hepatitis C. Sexually transmitted infections (STIs) Get screened for STIs, including gonorrhea and chlamydia, if: You are sexually active and are younger than 35 years of age. You are older than 35 years of age and your health care provider tells you that you are at risk for this type of infection. Your sexual activity has changed since you were last screened, and you are at increased risk for chlamydia or gonorrhea. Ask your health care provider if you are at risk. Ask your health care provider about whether you are at high risk for HIV. Your health care provider may recommend a prescription medicine to help prevent HIV infection. If you choose to take medicine to prevent HIV, you should first get tested for HIV. You should then be tested every 3 months for as long as you are taking the medicine. Pregnancy If you are about to stop having your period (premenopausal) and you may become pregnant, seek counseling before you get pregnant. Take 400 to 800 micrograms (mcg) of folic acid every day if you become pregnant. Ask for birth control (contraception) if you want to prevent pregnancy. Osteoporosis and menopause Osteoporosis is a disease in which the bones lose minerals and strength with aging. This can result in bone fractures. If you are 20 years old or older, or if you are at risk for osteoporosis and fractures, ask your health care provider if you should: Be screened for bone loss. Take a calcium or vitamin D supplement to lower your risk of fractures. Be given hormone replacement therapy (HRT) to treat symptoms of menopause. Follow  these instructions at home: Alcohol use Do not drink alcohol if: Your health care provider tells you not to drink. You are pregnant, may be pregnant, or are planning to become pregnant. If you drink alcohol: Limit how much you have to: 0-1 drink a day. Know how much alcohol is in your drink. In the [place] one drink equals one 12 oz bottle of beer (355 mL), one 5 oz glass of wine (148 mL), or one 1 oz glass of hard liquor (44 mL). Lifestyle Do not use any products that contain nicotine or tobacco. These products include cigarettes, chewing tobacco, and vaping devices, such as e-cigarettes. If you need help quitting, ask your health care provider. Do not use street drugs. Do not share needles. Ask your health care provider for help if you need support or information about quitting drugs. General instructions Schedule regular health, dental, and eye exams.  Stay current with your vaccines. Tell your health care provider if: You often feel depressed. You have ever been abused or do not feel safe at home. Summary Adopting a healthy lifestyle and getting preventive care are important in promoting health and wellness. Follow your health care provider's instructions about healthy diet, exercising, and getting tested or screened for diseases. Follow your health care provider's instructions on monitoring your cholesterol and blood pressure. This information is not intended to replace advice given to you by your health care provider. Make sure you discuss any questions you have with your health care provider. Document Revised: 06/28/2020 Document Reviewed: 06/28/2020 Elsevier Patient Education  2022 [employer].

## 2021-03-03 NOTE — Progress Notes (Signed)
Jocelyn Taylor,   Cholesterol looks great! Kidney, liver, glucose look great.  B12 on low side of normal start b12 daily.  Vitamin D low start 2000 units daily  Thyroid is showing hypothyroid. Will get free t4 and free 3 levels.  Thyroid medication is the indication for this. Thoughts?   Some labs still pending.

## 2021-03-07 ENCOUNTER — Encounter: Payer: Self-pay | Admitting: Physician Assistant

## 2021-03-07 DIAGNOSIS — M25561 Pain in right knee: Secondary | ICD-10-CM | POA: Insufficient documentation

## 2021-03-07 DIAGNOSIS — Z6834 Body mass index (BMI) 34.0-34.9, adult: Secondary | ICD-10-CM | POA: Insufficient documentation

## 2021-03-07 DIAGNOSIS — E6609 Other obesity due to excess calories: Secondary | ICD-10-CM | POA: Insufficient documentation

## 2021-03-07 LAB — LIPID PANEL W/REFLEX DIRECT LDL
Cholesterol: 180 mg/dL (ref <200)
HDL: 69 mg/dL (ref >=50)
LDL Cholesterol (Calc): 95 mg/dL (calc)
Non-HDL Cholesterol (Calc): 111 mg/dL (calc) (ref <130)
Total CHOL/HDL Ratio: 2.6 (calc) (ref <5.0)
Triglycerides: 75 mg/dL (ref <150)

## 2021-03-07 LAB — COMPLETE METABOLIC PANEL WITH GFR
AG Ratio: 1.4 (calc) (ref 1.0–2.5)
ALT: 16 U/L (ref 6–29)
AST: 16 U/L (ref 10–30)
Albumin: 4.2 g/dL (ref 3.6–5.1)
Alkaline phosphatase (APISO): 66 U/L (ref 31–125)
BUN: 15 mg/dL (ref 7–25)
CO2: 26 mmol/L (ref 20–32)
Calcium: 9.3 mg/dL (ref 8.6–10.2)
Chloride: 104 mmol/L (ref 98–110)
Creat: 0.64 mg/dL (ref 0.50–0.97)
Globulin: 3 g/dL (calc) (ref 1.9–3.7)
Glucose, Bld: 86 mg/dL (ref 65–99)
Potassium: 4.7 mmol/L (ref 3.5–5.3)
Sodium: 137 mmol/L (ref 135–146)
Total Bilirubin: 0.3 mg/dL (ref 0.2–1.2)
Total Protein: 7.2 g/dL (ref 6.1–8.1)
eGFR: 119 mL/min/{1.73_m2} (ref >=60)

## 2021-03-07 LAB — T3, FREE: T3, Free: 2.7 pg/mL (ref 2.3–4.2)

## 2021-03-07 LAB — CBC WITH DIFFERENTIAL/PLATELET
Absolute Monocytes: 585 cells/uL (ref 200–950)
Basophils Absolute: 48 cells/uL (ref 0–200)
Basophils Relative: 0.7 %
Eosinophils Absolute: 156 cells/uL (ref 15–500)
Eosinophils Relative: 2.3 %
HCT: 37.7 % (ref 35.0–45.0)
Hemoglobin: 12.3 g/dL (ref 11.7–15.5)
Lymphs Abs: 1680 cells/uL (ref 850–3900)
MCH: 28.7 pg (ref 27.0–33.0)
MCHC: 32.6 g/dL (ref 32.0–36.0)
MCV: 88.1 fL (ref 80.0–100.0)
MPV: 9.7 fL (ref 7.5–12.5)
Monocytes Relative: 8.6 %
Neutro Abs: 4332 cells/uL (ref 1500–7800)
Neutrophils Relative %: 63.7 %
Platelets: 422 10*3/uL — ABNORMAL HIGH (ref 140–400)
RBC: 4.28 10*6/uL (ref 3.80–5.10)
RDW: 13.8 % (ref 11.0–15.0)
Total Lymphocyte: 24.7 %
WBC: 6.8 10*3/uL (ref 3.8–10.8)

## 2021-03-07 LAB — B12 AND FOLATE PANEL
Folate: 14.3 ng/mL
Vitamin B-12: 319 pg/mL (ref 200–1100)

## 2021-03-07 LAB — RHEUMATOID ARTHRITIS DIAGNOSTIC PANEL, COMPREHENSIVE
Cyclic Citrullin Peptide Ab: <16 Units (ref <20)
Rheumatoid Factor (IgA): <5 U (ref <=6)
Rheumatoid Factor (IgG): <5 U (ref <=6)
Rheumatoid Factor (IgM): 6 U (ref <=6)
SSA (Ro) (ENA) Antibody, IgG: <1 AI
SSB (La) (ENA) Antibody, IgG: <1 AI

## 2021-03-07 LAB — T4, FREE: Free T4: 0.8 ng/dL (ref 0.8–1.8)

## 2021-03-07 LAB — TSH: TSH: 9.52 mIU/L — ABNORMAL HIGH

## 2021-03-07 LAB — VITAMIN D 25 HYDROXY (VIT D DEFICIENCY, FRACTURES): Vit D, 25-Hydroxy: 24 ng/mL — ABNORMAL LOW (ref 30–100)

## 2021-03-07 NOTE — Progress Notes (Signed)
Subjective:     Jocelyn Taylor is a 36 y.o. female and is here for a comprehensive physical exam. The patient reports problems - she would like to work on weight loss, she is having bilateral knee pain with walking up stairs, sqauatting, getting up .  Social History   Socioeconomic History   Marital status: Married    Spouse name: Not on file   Number of children: Not on file   Years of education: Not on file   Highest education level: Not on file  Occupational History   Not on file  Tobacco Use   Smoking status: Never   Smokeless tobacco: Never  Vaping Use   Vaping Use: Never used  Substance and Sexual Activity   Alcohol use: Yes    Comment: socially   Drug use: No   Sexual activity: Yes  Other Topics Concern   Not on file  Social History Narrative   Not on file   Social Determinants of Health   Financial Resource Strain: Not on file  Food Insecurity: Not on file  Transportation Needs: Not on file  Physical Activity: Not on file  Stress: Not on file  Social Connections: Not on file  Intimate Partner Violence: Not on file   Health Maintenance  Topic Date Due   COVID-19 Vaccine (1) Never done   HIV Screening  Never done   Hepatitis C Screening  Never done   INFLUENZA VACCINE  09/20/2020   PAP SMEAR-Modifier  08/31/2021   TETANUS/TDAP  08/21/2022   Pneumococcal Vaccine 57-75 Years old  Aged Out   HPV VACCINES  Aged Out    The following portions of the patient's history were reviewed and updated as appropriate: allergies, current medications, past family history, past medical history, past social history, past surgical history, and problem list.  Review of Systems Pertinent items noted in HPI and remainder of comprehensive ROS otherwise negative.   Objective:    BP 122/80    Pulse 95    Ht 5\' 5"  (1.651 m)    Wt 200 lb 12.8 oz (91.1 kg)    SpO2 98%    BMI 33.41 kg/m  General appearance: alert, cooperative, appears stated age, and mildly obese Head:  Normocephalic, without obvious abnormality, atraumatic Eyes: conjunctivae/corneas clear. PERRL, EOM's intact. Fundi benign. Ears: normal TM's and external ear canals both ears Nose: Nares normal. Septum midline. Mucosa normal. No drainage or sinus tenderness. Throat: lips, mucosa, and tongue normal; teeth and gums normal Neck: no adenopathy, no carotid bruit, no JVD, supple, symmetrical, trachea midline, and thyroid not enlarged, symmetric, no tenderness/mass/nodules Back: symmetric, no curvature. ROM normal. No CVA tenderness. Lungs: clear to auscultation bilaterally Heart: regular rate and rhythm, S1, S2 normal, no murmur, click, rub or gallop Abdomen: soft, non-tender; bowel sounds normal; no masses,  no organomegaly Extremities: extremities normal, atraumatic, no cyanosis or edema Pulses: 2+ and symmetric Skin: Skin color, texture, turgor normal. No rashes or lesions Lymph nodes: Cervical, supraclavicular, and axillary nodes normal. Neurologic: Grossly normal   .. Depression screen [facility] 2/9 03/02/2021 11/13/2019 09/01/2019 12/23/2017  Decreased Interest 0 0 0 0  Down, Depressed, Hopeless 0 0 0 0  PHQ - 2 Score 0 0 0 0  Altered sleeping 1 1 0 -  Tired, decreased energy 0 0 0 -  Change in appetite 0 0 0 -  Feeling bad or failure about yourself  0 0 0 -  Trouble concentrating 1 1 0 -  Moving slowly or  fidgety/restless 0 0 0 -  Suicidal thoughts 0 0 0 -  PHQ-9 Score 2 2 0 -  Difficult doing work/chores Somewhat difficult Not difficult at all Not difficult at all -   .. GAD 7 : Generalized Anxiety Score 03/02/2021 11/13/2019 09/01/2019  Nervous, Anxious, on Edge 0 1 0  Control/stop worrying 0 0 1  Worry too much - different things 0 0 1  Trouble relaxing 0 0 1  Restless 0 0 0  Easily annoyed or irritable 0 0 0  Afraid - awful might happen 0 0 0  Total GAD 7 Score 0 1 3  Anxiety Difficulty Not difficult at all Not difficult at all Not difficult at all     Assessment:    Healthy  female exam.      Plan:  Marland KitchenMarland KitchenAna was seen today for annual exam.  Diagnoses and all orders for this visit:  Routine physical examination -     TSH -     Lipid Panel w/reflex Direct LDL -     COMPLETE METABOLIC PANEL WITH GFR -     CBC with Differential/Platelet -     Rheumatoid Arthritis Diagnostic Panel, Comprehensive -     B12 and Folate Panel -     VITAMIN D 25 Hydroxy (Vit-D Deficiency, Fractures)  Screening for lipid disorders -     Lipid Panel w/reflex Direct LDL  Screening for diabetes mellitus -     COMPLETE METABOLIC PANEL WITH GFR  Hashimoto's thyroiditis -     TSH -     T4, free -     T3, free  Class 1 obesity due to excess calories without serious comorbidity with body mass index (BMI) of 33.0 to 33.9 in adult  Adult ADHD -     methylphenidate (CONCERTA) 27 MG PO CR tablet; Take 1 tablet (27 mg total) by mouth every morning. -     methylphenidate (CONCERTA) 27 MG PO CR tablet; Take 1 tablet (27 mg total) by mouth every morning. -     methylphenidate (CONCERTA) 27 MG PO CR tablet; Take 1 tablet (27 mg total) by mouth every morning.  Acute pain of both knees   .[name] Discussed 150 minutes of exercise a week.  Encouraged vitamin D 1000 units and Calcium 1300mg  or 4 servings of dairy a day.  Fasting labs ordered.  PHQ/GAD no concerns Pap UTD. 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  Follow up in 6 months on ADHD/thyroid.    See After Visit Summary for Counseling Recommendations

## 2021-03-07 NOTE — Progress Notes (Signed)
Your free T4 and T3 levels are just barely but still in normal range. We could just recheck TSH and panel in 3 month and watch for T4 and T3 numbers to drop if feeling good now with thyroid symptoms.

## 2021-03-08 NOTE — Progress Notes (Signed)
Rheumatoid factor is negative as well.

## 2021-04-08 ENCOUNTER — Encounter: Payer: Self-pay | Admitting: Physician Assistant

## 2021-04-08 ENCOUNTER — Telehealth: Payer: Self-pay | Admitting: Physician Assistant

## 2021-04-08 ENCOUNTER — Other Ambulatory Visit: Payer: Self-pay | Admitting: Physician Assistant

## 2021-04-08 DIAGNOSIS — F909 Attention-deficit hyperactivity disorder, unspecified type: Secondary | ICD-10-CM

## 2021-04-08 MED ORDER — METHYLPHENIDATE HCL ER (OSM) 27 MG PO TBCR: 27.0000 mg | EXTENDED_RELEASE_TABLET | ORAL | 0 refills | Status: DC

## 2021-04-08 NOTE — Telephone Encounter (Signed)
Received a vm @ 3:18 from patient that she left vm for JJ to call her back in reference to sending her prescriptions to another pharmacy Patient can be reached at [phone]

## 2021-04-08 NOTE — Telephone Encounter (Signed)
Already documented in a separate encounter and sent to [facility], controlled drug has to be sent by [facility].

## 2021-04-08 NOTE — Telephone Encounter (Signed)
Sent!

## 2021-04-08 NOTE — Telephone Encounter (Signed)
Per patient:  Hello, I called in this morning to request this med be send to [facility] on [address] in [place].  I am now out of this medication because [facility] is out of stock.

## 2021-04-08 NOTE — Telephone Encounter (Signed)
Patient tried to pick up RX at [facility] and they are out of medication. [facility] called [facility] and it is available here, can you sign rx please?

## 2021-04-19 ENCOUNTER — Telehealth: Payer: Self-pay

## 2021-04-19 NOTE — Telephone Encounter (Addendum)
Initiated Prior authorization OYD:XAJOINOM 27MG  er tablets Via: Covermymeds Case/Key:[id number] Status: approved as of 04/19/21 Reason: Notified Pt via: Mychart

## 2021-05-19 ENCOUNTER — Ambulatory Visit (INDEPENDENT_AMBULATORY_CARE_PROVIDER_SITE_OTHER): Payer: Managed Care, Other (non HMO) | Admitting: Podiatry

## 2021-05-19 ENCOUNTER — Encounter: Payer: Self-pay | Admitting: Podiatry

## 2021-05-19 DIAGNOSIS — M21611 Bunion of right foot: Secondary | ICD-10-CM | POA: Diagnosis not present

## 2021-05-19 DIAGNOSIS — M216X1 Other acquired deformities of right foot: Secondary | ICD-10-CM

## 2021-05-19 DIAGNOSIS — M7751 Other enthesopathy of right foot: Secondary | ICD-10-CM

## 2021-05-19 NOTE — Progress Notes (Signed)
?  Subjective:  ?Patient ID: Jocelyn Taylor, female    DOB: March 29, 1985,   MRN: [id number] ? ?Chief Complaint  ?Patient presents with  ? Foot Pain  ?  The big toe area is bigger and does hurt at times and Dr Jacqualyn Posey gave an injection which lasted a day or so and hurts with shoes and wants to talk about opinions for surgery  ? ? ?36 y.o. female presents for ongoing right great toe pain. Has been following with Dr. Jacqualyn Posey for about a year and was initially seen for this problem. Relates she has swelling redness and pain especially in shoes and even when walking barefoot. States she has tried injection which did not help as well as anti-inflammatories which relief some of the pain and topicals and padding. Relates they last discussed surgery and patient was going to think about it. Here to discuss surgery and get a second opinion on other options . Denies any other pedal complaints. Denies n/v/f/c.  ? ?Past Medical History:  ?Diagnosis Date  ? ADHD (attention deficit hyperactivity disorder)   ? Goiter   ? ? ?Objective:  ?Physical Exam: ?Vascular: DP/PT pulses 2/4 bilateral. CFT <3 seconds. Normal hair growth on digits. No edema.  ?Skin. No lacerations or abrasions bilateral feet.  ?Musculoskeletal: MMT 5/5 bilateral lower extremities in DF, PF, Inversion and Eversion. Deceased ROM in DF of ankle joint. Most tender to medial eminence of right first MPJ. There is pain with ROM of the right first MPJ. Pain dorsally and plantarly around the foot. Mild bunion deformity noted.  ?Neurological: Sensation intact to light touch.  ? ?Assessment:  ? ?1. Capsulitis of metatarsophalangeal (MTP) joint of right foot   ?2. Bunion, right   ? ? ? ?Plan:  ?Patient was evaluated and treated and all questions answered. ?Reviewed prior noted.  ?-Xrays reviewed. No acute fractures or dislocation mild increased in IM angle around 10 degrees. Prominence and soft tissue swelling noted at medial eminence.  ?-Discussed HAV and treatment  options;conservative and surgical management; risks, benefits, alternatives discussed. All patient's questions answered. ?-Discussed padding and wide shoe gear.   ?-Recommend continue with good supportive shoes and inserts.  ?-Patient has tried all conservative options and ready to discuss surgical options discuss shaving of the bone and cleaning around joint vs fixation of the bunion with screws and various post op periods. Patient would like to try just shaving bump to see if this will ease pain as she needs a quick recovery because she has two kids at home.  ?-Informed surgical risk consent was reviewed and read aloud to the patient.  I reviewed the films.  I have discussed my findings with the patient in great detail.  I have discussed all risks including but not limited to infection, stiffness, scarring, limp, disability, deformity, damage to blood vessels and nerves, numbness, poor healing, need for braces, arthritis, chronic pain, amputation, death.  All benefits and realistic expectations discussed in great detail.  I have made no promises as to the outcome.  I have provided realistic expectations.  I have offered the patient a 2nd opinion, which they have declined and assured me they preferred to proceed despite the risks. ?-Will plan for surgery sometime in April.  ? ?Lorenda Peck, DPM  ? ? ?

## 2021-07-26 ENCOUNTER — Encounter: Payer: Self-pay | Admitting: Physician Assistant

## 2021-08-05 ENCOUNTER — Other Ambulatory Visit: Payer: Self-pay | Admitting: Physician Assistant

## 2021-08-05 DIAGNOSIS — F909 Attention-deficit hyperactivity disorder, unspecified type: Secondary | ICD-10-CM

## 2021-08-09 ENCOUNTER — Telehealth (INDEPENDENT_AMBULATORY_CARE_PROVIDER_SITE_OTHER): Payer: Managed Care, Other (non HMO) | Admitting: Physician Assistant

## 2021-08-09 ENCOUNTER — Encounter: Payer: Self-pay | Admitting: Physician Assistant

## 2021-08-09 VITALS — Wt 205.0 lb

## 2021-08-09 DIAGNOSIS — F909 Attention-deficit hyperactivity disorder, unspecified type: Secondary | ICD-10-CM | POA: Diagnosis not present

## 2021-08-09 DIAGNOSIS — Z6834 Body mass index (BMI) 34.0-34.9, adult: Secondary | ICD-10-CM

## 2021-08-09 DIAGNOSIS — E6609 Other obesity due to excess calories: Secondary | ICD-10-CM | POA: Diagnosis not present

## 2021-08-09 MED ORDER — METHYLPHENIDATE HCL ER (OSM) 27 MG PO TBCR: 27.0000 mg | EXTENDED_RELEASE_TABLET | ORAL | 0 refills | Status: DC

## 2021-08-09 NOTE — Progress Notes (Signed)
..  Virtual Visit via Video Note  I connected with Jocelyn Taylor on 08/09/21 at 11:30 AM EDT by a video enabled telemedicine application and verified that I am speaking with the correct person using two identifiers.  Location: Patient: car Provider: clinic  .Marland KitchenParticipating in visit:  Patient: Jocelyn Taylor Provider: Tandy Gaw PA-S Provider in training: Clint Lipps PA-C   I discussed the limitations of evaluation and management by telemedicine and the availability of in person appointments. The patient expressed understanding and agreed to proceed.  History of Present Illness: Pt is a 36 yo female with ADHD who needs refills.   She is doing well on concerta with no concerns. No increase in anxiety, palpitations, headaches. She is doing well at work.   She does want to lose weight. She was 212 a few weeks ago and made lots of diet changes and initially lost some weight but not lost any in 2 weeks. She is frustrated. She feels like she has done this for years. Up and down with weight. She is moving more.     Observations/Objective: No acute distress Normal mood and appearance  .[name] Today's Vitals   08/09/21 1228  Weight: 205 lb (93 kg)   Body mass index is 34.11 kg/m.    Assessment and Plan: Marland KitchenMarland KitchenDelphia was seen today for follow-up.  Diagnoses and all orders for this visit:  Adult ADHD -     methylphenidate (CONCERTA) 27 MG PO CR tablet; Take 1 tablet (27 mg total) by mouth every morning. -     methylphenidate (CONCERTA) 27 MG PO CR tablet; Take 1 tablet (27 mg total) by mouth every morning. -     methylphenidate (CONCERTA) 27 MG PO CR tablet; Take 1 tablet (27 mg total) by mouth every morning.  Class 1 obesity due to excess calories without serious comorbidity with body mass index (BMI) of 34.0 to 34.9 in adult   Refilled concerta.   [name].Discussed low carb diet with 1500 calories and 80g of protein.  Exercising at least 150 minutes a week.  My Fitness Pal could be a Personal assistant.  Discussed wegovy vs contrave. Pt will consider   Follow Up Instructions:    I discussed the assessment and treatment plan with the patient. The patient was provided an opportunity to ask questions and all were answered. The patient agreed with the plan and demonstrated an understanding of the instructions.   The patient was advised to call back or seek an in-person evaluation if the symptoms worsen or if the condition fails to improve as anticipated.    Tandy Gaw, PA-C

## 2021-09-21 ENCOUNTER — Encounter: Payer: Self-pay | Admitting: Neurology

## 2021-10-11 ENCOUNTER — Encounter: Payer: Self-pay | Admitting: Physician Assistant

## 2021-10-19 ENCOUNTER — Ambulatory Visit (INDEPENDENT_AMBULATORY_CARE_PROVIDER_SITE_OTHER): Payer: Managed Care, Other (non HMO) | Admitting: Physician Assistant

## 2021-10-19 ENCOUNTER — Encounter: Payer: Self-pay | Admitting: Physician Assistant

## 2021-10-19 VITALS — BP 129/71 | HR 83 | Wt 207.1 lb

## 2021-10-19 DIAGNOSIS — L853 Xerosis cutis: Secondary | ICD-10-CM

## 2021-10-19 DIAGNOSIS — R5383 Other fatigue: Secondary | ICD-10-CM

## 2021-10-19 DIAGNOSIS — G4700 Insomnia, unspecified: Secondary | ICD-10-CM | POA: Diagnosis not present

## 2021-10-19 DIAGNOSIS — E063 Autoimmune thyroiditis: Secondary | ICD-10-CM

## 2021-10-19 DIAGNOSIS — R635 Abnormal weight gain: Secondary | ICD-10-CM

## 2021-10-19 NOTE — Progress Notes (Signed)
Acute Office Visit  Subjective:     Patient ID: Jocelyn Taylor, female    DOB: 1985/09/29, 36 y.o.   MRN: [id number]  Chief Complaint  Patient presents with   Weight Management Screening    Patient complaining of weight gaining and irregular periods and insomnia.    HPI Patient is in today for weight gain, irregular periods, not being able to go to sleep, skin changes and just not feeling well.   She has had mild issues for the last year but the last 2 weeks they have been terrible. It can take her 3hours to fall asleep at times. Unisom does help. Her periods are getting closer together and heavier. She has no energy and not being able to lose weight.   .. 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   Chronic neck pain 11/17/2019   Myalgia 11/17/2019   Great toe pain, right 05/21/2020   Lumbar degenerative disc disease 06/18/2020   Hemorrhoids 09/10/2018   Sebaceous cyst 11/01/2020   Class 1 obesity due to excess calories without serious comorbidity with body mass index (BMI) of 34.0 to 34.9 in adult 03/07/2021   Acute pain of both knees 03/07/2021   Resolved Ambulatory Problems    Diagnosis Date Noted   Chronic midline thoracic back pain 09/01/2019   Chronic bilateral low back pain without sciatica 09/01/2019   Past Medical History:  Diagnosis Date   ADHD (attention deficit hyperactivity disorder)    Goiter      ROS See HPI.      Objective:    BP 129/71   Pulse 83   Wt 207 lb 1.9 oz (93.9 kg)   SpO2 99%   BMI 34.47 kg/m  BP Readings from Last 3 Encounters:  10/19/21  129/71  03/02/21 122/80  09/29/20 117/87   Wt Readings from Last 3 Encounters:  10/19/21 207 lb 1.9 oz (93.9 kg)  08/09/21 205 lb (93 kg)  03/02/21 200 lb 12.8 oz (91.1 kg)      Physical Exam Constitutional:      Appearance: Normal appearance. She is obese.  Cardiovascular:     Rate and Rhythm: Normal rate and regular rhythm.     Pulses: Normal pulses.     Heart sounds: Normal heart sounds.  Pulmonary:     Effort: Pulmonary effort is normal.  Neurological:     General: No focal deficit present.     Mental Status: She is alert and oriented to person, place, and time.  Psychiatric:        Mood and Affect: Mood normal.          Assessment & Plan:  Marland KitchenMarland KitchenAna was seen today for weight management screening.  Diagnoses and all orders for this visit:  Hashimoto's thyroiditis -     TSH -     T3, free -     T4, free  Abnormal weight gain -     FSH/LH -     Estradiol -     Progesterone -     CP Testosterone, BIO-Female/Children -     COMPLETE METABOLIC PANEL WITH GFR -  Cortisol-am, blood  Insomnia, unspecified type -     FSH/LH -     Estradiol -     Progesterone -     CP Testosterone, BIO-Female/Children -     COMPLETE METABOLIC PANEL WITH GFR -     Cortisol-am, blood  No energy -     FSH/LH -     Estradiol -     Progesterone -     CP Testosterone, BIO-Female/Children -     COMPLETE METABOLIC PANEL WITH GFR -     Cortisol-am, blood  Dry skin -     FSH/LH -     Estradiol -     Progesterone -     CP Testosterone, BIO-Female/Children -     COMPLETE METABOLIC PANEL WITH GFR -     Cortisol-am, blood    Will get labs and make suggestions and adjustments as needed Her TSH was up about 6 months ago and many of these symptoms sound like thyroid symptoms I suggest thyroid treatment Discussed anti-inflammatory diet to help with inflammation Consider pelvic ultrasound as some point if periods are not regulating    Tandy Gaw, PA-C

## 2021-10-21 ENCOUNTER — Encounter: Payer: Self-pay | Admitting: Physician Assistant

## 2021-10-21 NOTE — Progress Notes (Signed)
Jocelyn Taylor,   Cortisol normal.  Kidney, liver, glucose looks good.  Not in menopause and hormones really look good.  I think we need to supplement your thyroid and see if that helps you feel better! Are you ok with starting?

## 2021-10-23 LAB — FSH/LH
FSH: 6.4 m[IU]/mL
LH: 8 m[IU]/mL

## 2021-10-23 LAB — TSH: TSH: 8.87 mIU/L — ABNORMAL HIGH

## 2021-10-23 LAB — CP TESTOSTERONE, BIO-FEMALE/CHILDREN
Albumin: 4.7 g/dL (ref 3.6–5.1)
Sex Hormone Binding: 54 nmol/L (ref 17–124)
TESTOSTERONE, BIOAVAILABLE: 5 ng/dL (ref 0.5–8.5)
Testosterone, Free: 2.3 pg/mL (ref 0.2–5.0)
Testosterone, Total, LC-MS-MS: 30 ng/dL (ref 2–45)

## 2021-10-23 LAB — COMPLETE METABOLIC PANEL WITH GFR
AG Ratio: 1.7 (calc) (ref 1.0–2.5)
ALT: 15 U/L (ref 6–29)
AST: 15 U/L (ref 10–30)
Albumin: 4.6 g/dL (ref 3.6–5.1)
Alkaline phosphatase (APISO): 56 U/L (ref 31–125)
BUN: 13 mg/dL (ref 7–25)
CO2: 25 mmol/L (ref 20–32)
Calcium: 9.7 mg/dL (ref 8.6–10.2)
Chloride: 104 mmol/L (ref 98–110)
Creat: 0.69 mg/dL (ref 0.50–0.97)
Globulin: 2.7 g/dL (calc) (ref 1.9–3.7)
Glucose, Bld: 79 mg/dL (ref 65–99)
Potassium: 4.3 mmol/L (ref 3.5–5.3)
Sodium: 139 mmol/L (ref 135–146)
Total Bilirubin: 0.4 mg/dL (ref 0.2–1.2)
Total Protein: 7.3 g/dL (ref 6.1–8.1)
eGFR: 117 mL/min/{1.73_m2} (ref >=60)

## 2021-10-23 LAB — CORTISOL-AM, BLOOD: Cortisol - AM: 6.9 ug/dL

## 2021-10-23 LAB — ESTRADIOL: Estradiol: 102 pg/mL

## 2021-10-23 LAB — T3, FREE: T3, Free: 3.6 pg/mL (ref 2.3–4.2)

## 2021-10-23 LAB — T4, FREE: Free T4: 0.9 ng/dL (ref 0.8–1.8)

## 2021-10-23 LAB — PROGESTERONE: Progesterone: 4.5 ng/mL

## 2021-10-25 NOTE — Progress Notes (Signed)
Testosterone is normal.  The creams I talked about were for hormones. There are only tablets right now for thyroid supplementation but there is more than levothyroxine. There is armour thyroid(more natural) but at times a little harder to regulate and branded Tirosint. Let me know your thoughts?

## 2021-10-28 MED ORDER — THYROID 15 MG PO TABS: ORAL_TABLET | ORAL | 1 refills | Status: DC

## 2021-11-24 ENCOUNTER — Other Ambulatory Visit: Payer: Self-pay | Admitting: Physician Assistant

## 2021-11-24 DIAGNOSIS — E063 Autoimmune thyroiditis: Secondary | ICD-10-CM

## 2021-12-03 LAB — TSH+FREE T4: TSH W/REFLEX TO FT4: 5.82 mIU/L — ABNORMAL HIGH

## 2021-12-03 LAB — T4, FREE: Free T4: 0.9 ng/dL (ref 0.8–1.8)

## 2021-12-05 ENCOUNTER — Other Ambulatory Visit: Payer: Self-pay | Admitting: Physician Assistant

## 2021-12-05 MED ORDER — THYROID 30 MG PO TABS: 30.0000 mg | ORAL_TABLET | Freq: Every day | ORAL | 1 refills | Status: DC

## 2021-12-05 NOTE — Progress Notes (Signed)
TSH elevated representing HYPO(LOW) thyroid level. Increased armour thyroid to 60mcg and sent to pharmacy. Lab only recheck in 6 weeks.

## 2021-12-06 ENCOUNTER — Other Ambulatory Visit: Payer: Self-pay | Admitting: Neurology

## 2021-12-06 DIAGNOSIS — E063 Autoimmune thyroiditis: Secondary | ICD-10-CM

## 2021-12-23 ENCOUNTER — Other Ambulatory Visit: Payer: Self-pay | Admitting: Physician Assistant

## 2021-12-23 ENCOUNTER — Encounter: Payer: Self-pay | Admitting: Physician Assistant

## 2021-12-27 ENCOUNTER — Other Ambulatory Visit: Payer: Self-pay | Admitting: Physician Assistant

## 2021-12-27 DIAGNOSIS — F909 Attention-deficit hyperactivity disorder, unspecified type: Secondary | ICD-10-CM

## 2021-12-27 MED ORDER — METHYLPHENIDATE HCL ER (OSM) 27 MG PO TBCR: 27.0000 mg | EXTENDED_RELEASE_TABLET | ORAL | 0 refills | Status: DC

## 2021-12-27 MED ORDER — TRAZODONE HCL 50 MG PO TABS: 25.0000 mg | ORAL_TABLET | Freq: Every evening | ORAL | 1 refills | Status: DC | PRN

## 2021-12-27 NOTE — Telephone Encounter (Signed)
Refill request for Methylphenidate CR 27mg  Last Rx 10/07/2021 Last OV 08/09/21 video visit No upcoming appt scheduled

## 2022-01-09 ENCOUNTER — Telehealth: Payer: Managed Care, Other (non HMO) | Admitting: Nurse Practitioner

## 2022-01-09 DIAGNOSIS — J0140 Acute pansinusitis, unspecified: Secondary | ICD-10-CM

## 2022-01-09 DIAGNOSIS — R051 Acute cough: Secondary | ICD-10-CM | POA: Diagnosis not present

## 2022-01-09 MED ORDER — DOXYCYCLINE HYCLATE 100 MG PO TABS: 100.0000 mg | ORAL_TABLET | Freq: Two times a day (BID) | ORAL | 0 refills | Status: AC

## 2022-01-09 NOTE — Progress Notes (Signed)
E-Visit for Sinus Problems  We are sorry that you are not feeling well.  Here is how we plan to help!  Based on what you have shared with me it looks like you have sinusitis.  Sinusitis is inflammation and infection in the sinus cavities of the head.  Based on your presentation I believe you most likely have Acute Bacterial Sinusitis.  This is an infection caused by bacteria and is treated with antibiotics. I have prescribed Doxycycline 100mg  by mouth twice a day for 10 days.   You may continue to use an oral decongestant such as Mucinex D to help loosen the mucous and suppress the cough.   Saline nasal spray help and can safely be used as often as needed for congestion.  If you develop worsening sinus pain, fever or notice severe headache and vision changes, or if symptoms are not better after completion of antibiotic, please schedule an appointment with a health care provider.    Sinus infections are not as easily transmitted as other respiratory infection, however we still recommend that you avoid close contact with loved ones, especially the very young and elderly.  Remember to wash your hands thoroughly throughout the day as this is the number one way to prevent the spread of infection!  Home Care: Only take medications as instructed by your medical team. Complete the entire course of an antibiotic. Do not take these medications with alcohol. A steam or ultrasonic humidifier can help congestion.  You can place a towel over your head and breathe in the steam from hot water coming from a faucet. Avoid close contacts especially the very young and the elderly. Cover your mouth when you cough or sneeze. Always remember to wash your hands.  Get Help Right Away If: You develop worsening fever or sinus pain. You develop a severe head ache or visual changes. Your symptoms persist after you have completed your treatment plan.  Make sure you Understand these instructions. Will watch your  condition. Will get help right away if you are not doing well or get worse.  Thank you for choosing an e-visit.  Your e-visit answers were reviewed by a board certified advanced clinical practitioner to complete your personal care plan. Depending upon the condition, your plan could have included both over the counter or prescription medications.  Please review your pharmacy choice. Make sure the pharmacy is open so you can pick up prescription now. If there is a problem, you may contact your provider through and have the prescription routed to another pharmacy.  Your safety is important to [employer]. If you have drug allergies check your prescription carefully.   For the next 24 hours you can use MyChart to ask questions about today's visit, request a non-urgent call back, or ask for a work or school excuse. You will get an email in the next two days asking about your experience. I hope that your e-visit has been valuable and will speed your recovery.   I spent approximately 5 minutes reviewing the patient's history, current symptoms and coordinating their plan of care today.

## 2022-02-04 ENCOUNTER — Other Ambulatory Visit: Payer: Self-pay | Admitting: Physician Assistant

## 2022-02-24 ENCOUNTER — Other Ambulatory Visit: Payer: Self-pay | Admitting: Physician Assistant

## 2022-02-24 ENCOUNTER — Other Ambulatory Visit: Payer: Self-pay | Admitting: Neurology

## 2022-02-24 DIAGNOSIS — E063 Autoimmune thyroiditis: Secondary | ICD-10-CM

## 2022-02-24 LAB — TSH: TSH: 6.48 mIU/L — ABNORMAL HIGH

## 2022-02-24 MED ORDER — THYROID 60 MG PO TABS: 60.0000 mg | ORAL_TABLET | Freq: Every day | ORAL | 1 refills | Status: DC

## 2022-02-24 NOTE — Progress Notes (Signed)
Improving TSH but not at goal. Sent 60mg  of NP thyroid medication to pharmacy. Lab recheck in 6-8 weeks.

## 2022-03-08 ENCOUNTER — Other Ambulatory Visit: Payer: Self-pay | Admitting: Physician Assistant

## 2022-04-18 ENCOUNTER — Encounter: Payer: Self-pay | Admitting: Physician Assistant

## 2022-04-21 ENCOUNTER — Other Ambulatory Visit: Payer: Self-pay | Admitting: Physician Assistant

## 2022-04-21 LAB — TSH: TSH: 5.08 mIU/L — ABNORMAL HIGH

## 2022-04-21 MED ORDER — THYROID 90 MG PO TABS: 90.0000 mg | ORAL_TABLET | Freq: Every day | ORAL | 0 refills | Status: DC

## 2022-04-21 NOTE — Progress Notes (Signed)
Increased NP thyroid to 37mg. Recheck lab only in 3 months. TSH still not to optimal level but improving.

## 2022-04-24 ENCOUNTER — Other Ambulatory Visit: Payer: Self-pay | Admitting: Physician Assistant

## 2022-06-22 ENCOUNTER — Ambulatory Visit (INDEPENDENT_AMBULATORY_CARE_PROVIDER_SITE_OTHER): Payer: Managed Care, Other (non HMO) | Admitting: Podiatry

## 2022-06-22 ENCOUNTER — Ambulatory Visit: Payer: Managed Care, Other (non HMO)

## 2022-06-22 DIAGNOSIS — M7751 Other enthesopathy of right foot: Secondary | ICD-10-CM

## 2022-06-22 DIAGNOSIS — M778 Other enthesopathies, not elsewhere classified: Secondary | ICD-10-CM

## 2022-06-22 MED ORDER — MELOXICAM 15 MG PO TABS: 15.0000 mg | ORAL_TABLET | Freq: Every day | ORAL | 0 refills | Status: DC | PRN

## 2022-06-22 NOTE — Progress Notes (Signed)
Subjective:   Patient ID: Jocelyn Taylor, female   DOB: 37 y.o.   MRN: [id number]   HPI Chief Complaint  Patient presents with   Foot Pain    Right foot pain ball of foot near hallux. Pain occurs when she eats a lot of carbs and sugary foods. Pain comes and goes.    Ankle Pain    Right ankle pain in the middle of ankle. No known injuries. Patient describes pain as sharp and not constant.    37 year old female presents for above concerns.  She states that she still gets some discomfort along the first MPJ area but she has recently been getting pain to the medial aspect of the ankle joint for the last 5 weeks.  Hurts with pressure.  Not consistent.  No swelling.  No recent injuries.   Review of Systems  All other systems reviewed and are negative.  Past Medical History:  Diagnosis Date   ADHD (attention deficit hyperactivity disorder)    Goiter     Past Surgical History:  Procedure Laterality Date   BREAST BIOPSY Right 06/2006   HEMORRHOID SURGERY  2020   LUMBAR LAMINECTOMY/DECOMPRESSION MICRODISCECTOMY Right 09/29/2020   Procedure: RIGHT-SIDED LUMBAR 5 - SACRUM 1 MICRODISECTOMY;  Surgeon: Estill Bamberg, MD;  Location: [facility] OR;  Service: Orthopedics;  Laterality: Right;   TUBAL LIGATION Bilateral      Current Outpatient Medications:    meloxicam (MOBIC) 15 MG tablet, Take 1 tablet (15 mg total) by mouth daily as needed for pain., Disp: 30 tablet, Rfl: 0   methylphenidate (CONCERTA) 27 MG PO CR tablet, Take 1 tablet (27 mg total) by mouth every morning., Disp: 30 tablet, Rfl: 0   methylphenidate (CONCERTA) 27 MG PO CR tablet, Take 1 tablet (27 mg total) by mouth every morning., Disp: 30 tablet, Rfl: 0   methylphenidate (CONCERTA) 27 MG PO CR tablet, Take 1 tablet (27 mg total) by mouth every morning., Disp: 30 tablet, Rfl: 0   Multiple Vitamins-Minerals (MULTIVITAMIN WITH MINERALS) tablet, Take 1 tablet by mouth daily., Disp: , Rfl:    Multiple Vitamins-Minerals (ZINC PO), Take  by mouth., Disp: , Rfl:    thyroid (NP THYROID) 90 MG tablet, Take 1 tablet (90 mg total) by mouth daily before breakfast., Disp: 90 tablet, Rfl: 0  No Known Allergies        Objective:  Physical Exam  General: AAO x3, NAD  Dermatological: Skin is warm, dry and supple bilateral. There are no open sores, no preulcerative lesions, no rash or signs of infection present.  Vascular: Dorsalis Pedis artery and Posterior Tibial artery pedal pulses are 2/4 bilateral with immedate capillary fill time. There is no pain with calf compression, swelling, warmth, erythema.   Neruologic: Grossly intact via light touch bilateral.   Musculoskeletal: Tenderness palpation left first MPJ although minimal.  The majority tenderness is on the anterior medial aspect of the right ankle joint.  She does state it feels like a deep joint type pain.  There is no edema.  Flexor, extensor tendons appear to be intact.  There is no erythema or warmth.  Gait: Unassisted, Nonantalgic.       Assessment:        Plan:  -Treatment options discussed including all alternatives, risks, and complications -Etiology of symptoms were discussed -X-rays were obtained and reviewed with the patient.  3 views of the foot and 2 views of the ankle were obtained.  Mild bunion is present.  There is some radiodensity  noted along the first metatarsal head medially.  Ankle joint space maintained.  No evidence of acute fracture. -Prescribed mobic. Discussed side effects of the medication and directed to stop if any are to occur and call the office.  We discussed steroid use at this point the pain is not consistent she was to hold off on this.  Will reconsider this with things. She has an ankle brace already at home and continue with this. -Check blood work including Lasya, rheumatoid factor, HLA-B27, sed rate, CRP, uric acid  Vivi Barrack DPM   -Continue

## 2022-06-24 LAB — ANA: Anti Nuclear Antibody (ANA): POSITIVE — AB

## 2022-06-24 LAB — URIC ACID: Uric Acid, Serum: 3.8 mg/dL (ref 2.5–7.0)

## 2022-06-24 LAB — ANTI-NUCLEAR AB-TITER (ANA TITER): ANA Titer 1: 1:40 {titer} — ABNORMAL HIGH

## 2022-06-24 LAB — RHEUMATOID FACTOR: Rheumatoid fact SerPl-aCnc: <10 IU/mL (ref <14)

## 2022-06-24 LAB — HLA-B27 ANTIGEN: HLA-B27 Antigen: NEGATIVE

## 2022-06-24 LAB — C-REACTIVE PROTEIN: CRP: 4.7 mg/L (ref <8.0)

## 2022-06-24 LAB — SEDIMENTATION RATE: Sed Rate: 11 mm/h (ref 0–20)

## 2022-06-27 ENCOUNTER — Other Ambulatory Visit: Payer: Self-pay | Admitting: Podiatry

## 2022-06-27 DIAGNOSIS — R768 Other specified abnormal immunological findings in serum: Secondary | ICD-10-CM

## 2022-06-27 DIAGNOSIS — M778 Other enthesopathies, not elsewhere classified: Secondary | ICD-10-CM

## 2022-06-29 ENCOUNTER — Telehealth: Payer: Self-pay | Admitting: Physician Assistant

## 2022-06-29 NOTE — Telephone Encounter (Signed)
Patient called would like to know if you can see her husband as a new patient  Jocelyn Taylor

## 2022-06-30 ENCOUNTER — Encounter: Payer: Self-pay | Admitting: Podiatry

## 2022-06-30 NOTE — Telephone Encounter (Signed)
Called wife patient is scheduled for an appointment for May 17th

## 2022-07-05 ENCOUNTER — Encounter: Payer: Self-pay | Admitting: Physician Assistant

## 2022-07-05 ENCOUNTER — Telehealth (INDEPENDENT_AMBULATORY_CARE_PROVIDER_SITE_OTHER): Payer: Managed Care, Other (non HMO) | Admitting: Physician Assistant

## 2022-07-05 VITALS — Ht 65.0 in | Wt 212.0 lb

## 2022-07-05 DIAGNOSIS — E6609 Other obesity due to excess calories: Secondary | ICD-10-CM

## 2022-07-05 DIAGNOSIS — G4700 Insomnia, unspecified: Secondary | ICD-10-CM

## 2022-07-05 DIAGNOSIS — R5383 Other fatigue: Secondary | ICD-10-CM | POA: Insufficient documentation

## 2022-07-05 DIAGNOSIS — E063 Autoimmune thyroiditis: Secondary | ICD-10-CM

## 2022-07-05 DIAGNOSIS — Z6835 Body mass index (BMI) 35.0-35.9, adult: Secondary | ICD-10-CM

## 2022-07-05 DIAGNOSIS — F909 Attention-deficit hyperactivity disorder, unspecified type: Secondary | ICD-10-CM | POA: Diagnosis not present

## 2022-07-05 DIAGNOSIS — Z1159 Encounter for screening for other viral diseases: Secondary | ICD-10-CM

## 2022-07-05 MED ORDER — ATOMOXETINE HCL 40 MG PO CAPS
ORAL_CAPSULE | ORAL | 1 refills | Status: DC
Start: 2022-07-05 — End: 2023-06-06

## 2022-07-05 NOTE — Progress Notes (Signed)
Pt c/o weight loss and thyroid issues pt not taking concerta  and zinc pt states that she has been having sleep issues

## 2022-07-05 NOTE — Progress Notes (Signed)
..Virtual Visit via Video Note  I connected with Jocelyn Taylor on 07/05/22 at 10:30 AM EDT by a video enabled telemedicine application and verified that I am speaking with the correct person using two identifiers.  Location: Patient: work Provider: clinic  .Marland KitchenParticipating in visit:  Patient: Jocelyn Taylor Provider: Tandy Gaw PA-C   I discussed the limitations of evaluation and management by telemedicine and the availability of in person appointments. The patient expressed understanding and agreed to proceed.  History of Present Illness: Pt is a 37 yo obese female with hashimoto's thyroiditis, ADHD who calls into the clinic frustrated with no energy and weight gain. She was having problems sleeping and stopped concerta and caffeine. Her sleep improved but energy did not. She is not eating gluten and keeping a low carb diet. It is not helping her weight. She is exercising regularly. She has not tried any weight loss medications. She is meeting with bariatric provider soon to talk about weight loss surgery.   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   Chronic neck pain 11/17/2019   Myalgia 11/17/2019   Great toe pain, right 05/21/2020   Lumbar degenerative disc disease 06/18/2020   Hemorrhoids 09/10/2018   Sebaceous cyst 11/01/2020   Class 2 obesity due to excess calories without serious comorbidity with body mass index (BMI) of 35.0 to 35.9 in adult 03/07/2021   Acute pain of both knees 03/07/2021   Insomnia 07/05/2022   No energy 07/05/2022   Resolved Ambulatory Problems    Diagnosis Date Noted    Chronic midline thoracic back pain 09/01/2019   Chronic bilateral low back pain without sciatica 09/01/2019   Past Medical History:  Diagnosis Date   ADHD (attention deficit hyperactivity disorder)    Goiter       Observations/Objective: No acute distress Normal mood and appearance  .[name] Today's Vitals   07/05/22 1038  Weight: 212 lb (96.2 kg)  Height: 5\' 5"  (1.651 m)   Body mass index is 35.28 kg/m.    Assessment and Plan: Marland KitchenMarland KitchenJenniferlynn was seen today for follow-up.  Diagnoses and all orders for this visit:  No energy -     Thyroid peroxidase antibody -     B12 and Folate Panel -     VITAMIN D 25 Hydroxy (Vit-D Deficiency, Fractures) -     TSH -     T4, free -     T3, free -     COMPLETE METABOLIC PANEL WITH GFR -     CBC w/Diff/Platelet -     Fe+TIBC+Fer -     atomoxetine (STRATTERA) 40 MG capsule; Take one tablet daily for 2 weeks then increase to 2 tablets daily.  Class 2 obesity due to excess calories without serious comorbidity with body mass index (BMI) of 35.0 to 35.9 in adult -     Thyroid peroxidase antibody -     B12 and Folate Panel -     VITAMIN D 25 Hydroxy (Vit-D Deficiency, Fractures) -     COMPLETE METABOLIC PANEL WITH GFR -     CBC w/Diff/Platelet -     Fe+TIBC+Fer  Hashimoto's thyroiditis -  Thyroid peroxidase antibody -     TSH -     T4, free -     T3, free -     Fe+TIBC+Fer  Adult ADHD -     atomoxetine (STRATTERA) 40 MG capsule; Take one tablet daily for 2 weeks then increase to 2 tablets daily.  Insomnia, unspecified type  Encounter for hepatitis C screening test for low risk patient -     Hepatitis C Antibody   Discussed no energy Labs ordered to follow up on thyroid as well as other vitamins Start selenium 200mg  for thyroid health Start strattera for ADHD  Discussed weight loss and medications Will follow up after bariatric appt Will get labs    Follow Up Instructions:    I discussed the assessment and treatment plan  with the patient. The patient was provided an opportunity to ask questions and all were answered. The patient agreed with the plan and demonstrated an understanding of the instructions.   The patient was advised to call back or seek an in-person evaluation if the symptoms worsen or if the condition fails to improve as anticipated.  Tandy Gaw, PA-C

## 2022-07-14 ENCOUNTER — Encounter: Payer: Self-pay | Admitting: Physician Assistant

## 2022-07-14 ENCOUNTER — Other Ambulatory Visit: Payer: Self-pay | Admitting: Podiatry

## 2022-07-14 DIAGNOSIS — M7751 Other enthesopathy of right foot: Secondary | ICD-10-CM

## 2022-07-14 DIAGNOSIS — R768 Other specified abnormal immunological findings in serum: Secondary | ICD-10-CM

## 2022-07-14 DIAGNOSIS — M778 Other enthesopathies, not elsewhere classified: Secondary | ICD-10-CM

## 2022-07-19 ENCOUNTER — Other Ambulatory Visit: Payer: Self-pay

## 2022-07-19 MED ORDER — THYROID 90 MG PO TABS: 90.0000 mg | ORAL_TABLET | Freq: Every day | ORAL | 0 refills | Status: DC

## 2022-07-19 NOTE — Telephone Encounter (Signed)
Please print and complete and fill in blanks based on patient's answers and I can complete the rest for her.

## 2022-07-21 NOTE — Telephone Encounter (Signed)
Form completed. Pls attach med list. Given to tuttle, ang

## 2022-07-23 ENCOUNTER — Other Ambulatory Visit: Payer: Self-pay | Admitting: Physician Assistant

## 2022-07-25 LAB — CBC WITH DIFFERENTIAL/PLATELET
Absolute Monocytes: 508 cells/uL (ref 200–950)
Lymphs Abs: 1762 cells/uL (ref 850–3900)
MCV: 90.8 fL (ref 80.0–100.0)
Monocytes Relative: 7.7 %
Total Lymphocyte: 26.7 %
WBC: 6.6 10*3/uL (ref 3.8–10.8)

## 2022-07-26 LAB — B12 AND FOLATE PANEL
Folate: 6 ng/mL
Vitamin B-12: 251 pg/mL (ref 200–1100)

## 2022-07-26 LAB — COMPLETE METABOLIC PANEL WITH GFR
AG Ratio: 1.5 (calc) (ref 1.0–2.5)
ALT: 15 U/L (ref 6–29)
AST: 15 U/L (ref 10–30)
Albumin: 4.1 g/dL (ref 3.6–5.1)
Alkaline phosphatase (APISO): 64 U/L (ref 31–125)
BUN: 13 mg/dL (ref 7–25)
CO2: 24 mmol/L (ref 20–32)
Calcium: 9.1 mg/dL (ref 8.6–10.2)
Chloride: 105 mmol/L (ref 98–110)
Creat: 0.61 mg/dL (ref 0.50–0.97)
Globulin: 2.7 g/dL (calc) (ref 1.9–3.7)
Glucose, Bld: 85 mg/dL (ref 65–99)
Potassium: 4.1 mmol/L (ref 3.5–5.3)
Sodium: 138 mmol/L (ref 135–146)
Total Bilirubin: 0.5 mg/dL (ref 0.2–1.2)
Total Protein: 6.8 g/dL (ref 6.1–8.1)
eGFR: 119 mL/min/{1.73_m2} (ref >=60)

## 2022-07-26 LAB — IRON,TIBC AND FERRITIN PANEL
%SAT: 19 % (calc) (ref 16–45)
Ferritin: 3 ng/mL — ABNORMAL LOW (ref 16–154)
Iron: 94 ug/dL (ref 40–190)
TIBC: 486 mcg/dL (calc) — ABNORMAL HIGH (ref 250–450)

## 2022-07-26 LAB — CBC WITH DIFFERENTIAL/PLATELET
Basophils Absolute: 40 cells/uL (ref 0–200)
Basophils Relative: 0.6 %
Eosinophils Absolute: 330 cells/uL (ref 15–500)
Eosinophils Relative: 5 %
HCT: 36.6 % (ref 35.0–45.0)
Hemoglobin: 11.6 g/dL — ABNORMAL LOW (ref 11.7–15.5)
MCH: 28.8 pg (ref 27.0–33.0)
MCHC: 31.7 g/dL — ABNORMAL LOW (ref 32.0–36.0)
MPV: 9.3 fL (ref 7.5–12.5)
Neutro Abs: 3960 cells/uL (ref 1500–7800)
Neutrophils Relative %: 60 %
Platelets: 395 10*3/uL (ref 140–400)
RBC: 4.03 10*6/uL (ref 3.80–5.10)
RDW: 13.6 % (ref 11.0–15.0)

## 2022-07-26 LAB — T3, FREE: T3, Free: 3.8 pg/mL (ref 2.3–4.2)

## 2022-07-26 LAB — TSH: TSH: 4.46 mIU/L

## 2022-07-26 LAB — T4, FREE: Free T4: 0.9 ng/dL (ref 0.8–1.8)

## 2022-07-26 LAB — VITAMIN D 25 HYDROXY (VIT D DEFICIENCY, FRACTURES): Vit D, 25-Hydroxy: 24 ng/mL — ABNORMAL LOW (ref 30–100)

## 2022-07-26 LAB — THYROID PEROXIDASE ANTIBODY: Thyroperoxidase Ab SerPl-aCnc: >900 IU/mL — ABNORMAL HIGH (ref <9)

## 2022-07-26 LAB — HEPATITIS C ANTIBODY: Hepatitis C Ab: NONREACTIVE

## 2022-07-28 MED ORDER — FERROUS SULFATE 325 (65 FE) MG PO TBEC: 325.0000 mg | DELAYED_RELEASE_TABLET | Freq: Every day | ORAL | 3 refills | Status: DC

## 2022-07-28 MED ORDER — THYROID 120 MG PO TABS: 120.0000 mg | ORAL_TABLET | Freq: Every day | ORAL | 1 refills | Status: DC

## 2022-07-28 NOTE — Addendum Note (Signed)
Addended by: Jomarie Longs on: 07/28/2022 01:29 PM   Modules accepted: Orders

## 2022-07-28 NOTE — Progress Notes (Signed)
Jocelyn Taylor,   Kidney, liver, glucose look good.  T3 and T4 are stable but still on the low side of normal.  TSH has improved. Your thyroid antibodies are still very high.  I would like to increase thyroid medication a little more. Are you ok with that?  B12 low. Need to take b12 daily.  Vitamin D low. Make sure taking 5000 units daily with dairy.  Your iron stores are really low as well. You need to be on some type of iron supplement. Have you been able to tolerate oral iron in the past?

## 2022-07-30 ENCOUNTER — Encounter: Payer: Self-pay | Admitting: Physician Assistant

## 2022-07-30 DIAGNOSIS — E063 Autoimmune thyroiditis: Secondary | ICD-10-CM

## 2022-07-31 MED ORDER — THYROID 120 MG PO TABS: 120.0000 mg | ORAL_TABLET | Freq: Every day | ORAL | 1 refills | Status: DC

## 2022-08-02 ENCOUNTER — Encounter: Payer: Self-pay | Admitting: Podiatry

## 2022-09-01 ENCOUNTER — Ambulatory Visit: Payer: Managed Care, Other (non HMO) | Admitting: Podiatry

## 2022-09-01 ENCOUNTER — Telehealth: Payer: Self-pay | Admitting: Podiatry

## 2022-09-01 DIAGNOSIS — M7751 Other enthesopathy of right foot: Secondary | ICD-10-CM | POA: Diagnosis not present

## 2022-09-01 DIAGNOSIS — M899 Disorder of bone, unspecified: Secondary | ICD-10-CM | POA: Diagnosis not present

## 2022-09-01 DIAGNOSIS — M949 Disorder of cartilage, unspecified: Secondary | ICD-10-CM | POA: Diagnosis not present

## 2022-09-01 NOTE — Progress Notes (Signed)
Subjective:   Patient ID: Jocelyn Taylor, female   DOB: 37 y.o.   MRN: [id number]   HPI Chief Complaint  Patient presents with   Ankle Pain    Right ankle pain. Patient continues to have pain to ankle and pain radiates to her arch. Sharp pain when she is constantly on her feet. Taking Mobic every 3 days and she is not using the ankle brace because it makes the pain worst.     37 year old female presents for above concerns.  She said that she is having ankle pain she points along the more anterior aspect of the joint where she gets majority discomfort.  No injuries.  She has an appoint with rheumatology in August.   Review of Systems  All other systems reviewed and are negative.       Objective:  Physical Exam  General: AAO x3, NAD  Dermatological: Skin is warm, dry and supple bilateral. There are no open sores, no preulcerative lesions, no rash or signs of infection present.  Vascular: Dorsalis Pedis artery and Posterior Tibial artery pedal pulses are 2/4 bilateral with immedate capillary fill time. There is no pain with calf compression, swelling, warmth, erythema.   Neruologic: Grossly intact via light touch bilateral.   Musculoskeletal: Moderate tenderness is still localized on the anterior medial aspect of the ankle joint and appears to be coming to the ankle joint itself.  There is no pain on the extensor tendons.  There is no edema, erythema.  There is no crepitation with ankle joint range of motion or any restriction.  Gait: Unassisted, Nonantalgic.       Assessment:   Capsulitis right ankle, rule out osteochondral lesion     Plan:  -Treatment options discussed including all alternatives, risks, and complications -Etiology of symptoms were discussed -Given ongoing symptoms we discussed steroid injection but ultimately she decided to hold off on this given upcoming surgery which I agree with.  I recommend MRI and she was proceed with this.  MRI ordered to rule out  osteochondral lesion versus other pathology of the ankle joint given ongoing nature of her symptoms. -Follow-up with rheumatology given chronic joint pain, positive Lovey.  Return for MRI right ankle results .  Vivi Barrack DPM

## 2022-09-01 NOTE — Telephone Encounter (Signed)
Patient was able to schedule MRI for tomorrow, but a prior Berkley Harvey is needed.  Called [facility], spoke to Berry and gave her CPT code for MRI ankle w/o contrast with dx codes on order form.  It was approved.  Approval #[id number]   This is for [facility] which is a preferred location with her plan.  Miracle was informed and she will notify the patient.

## 2022-09-02 ENCOUNTER — Ambulatory Visit: Payer: Managed Care, Other (non HMO)

## 2022-09-02 DIAGNOSIS — M949 Disorder of cartilage, unspecified: Secondary | ICD-10-CM | POA: Diagnosis not present

## 2022-09-02 DIAGNOSIS — M899 Disorder of bone, unspecified: Secondary | ICD-10-CM

## 2022-09-05 ENCOUNTER — Encounter: Payer: Self-pay | Admitting: Podiatry

## 2022-09-12 ENCOUNTER — Other Ambulatory Visit: Payer: Self-pay | Admitting: Physician Assistant

## 2022-09-12 DIAGNOSIS — E063 Autoimmune thyroiditis: Secondary | ICD-10-CM

## 2022-09-12 DIAGNOSIS — Z79899 Other long term (current) drug therapy: Secondary | ICD-10-CM

## 2022-09-12 DIAGNOSIS — R790 Abnormal level of blood mineral: Secondary | ICD-10-CM

## 2022-09-12 NOTE — Progress Notes (Signed)
Needs labs to follow up on medication.

## 2022-09-13 LAB — CBC WITH DIFFERENTIAL/PLATELET
Basophils Absolute: 0 10*3/uL (ref 0.0–0.2)
Basos: 1 %
EOS (ABSOLUTE): 0.2 10*3/uL (ref 0.0–0.4)
Eos: 4 %
Hematocrit: 40.1 % (ref 34.0–46.6)
Hemoglobin: 12.8 g/dL (ref 11.1–15.9)
Immature Grans (Abs): 0 10*3/uL (ref 0.0–0.1)
Immature Granulocytes: 0 %
Lymphocytes Absolute: 1.4 10*3/uL (ref 0.7–3.1)
Lymphs: 25 %
MCH: 27.9 pg (ref 26.6–33.0)
MCHC: 31.9 g/dL (ref 31.5–35.7)
MCV: 88 fL (ref 79–97)
Monocytes Absolute: 0.6 10*3/uL (ref 0.1–0.9)
Monocytes: 11 %
Neutrophils Absolute: 3.2 10*3/uL (ref 1.4–7.0)
Neutrophils: 59 %
Platelets: 360 10*3/uL (ref 150–450)
RBC: 4.58 x10E6/uL (ref 3.77–5.28)
RDW: 14.3 % (ref 11.7–15.4)
WBC: 5.4 10*3/uL (ref 3.4–10.8)

## 2022-09-13 LAB — IRON,TIBC AND FERRITIN PANEL
Ferritin: 9 ng/mL — ABNORMAL LOW (ref 15–150)
Iron Saturation: 8 % — CL (ref 15–55)
Iron: 37 ug/dL (ref 27–159)
Total Iron Binding Capacity: 476 ug/dL — ABNORMAL HIGH (ref 250–450)
UIBC: 439 ug/dL — ABNORMAL HIGH (ref 131–425)

## 2022-09-13 LAB — TSH: TSH: 0.13 u[IU]/mL — ABNORMAL LOW (ref 0.450–4.500)

## 2022-09-13 LAB — T4, FREE: Free T4: 1.42 ng/dL (ref 0.82–1.77)

## 2022-09-13 LAB — T3, FREE: T3, Free: 7.8 pg/mL — ABNORMAL HIGH (ref 2.0–4.4)

## 2022-09-13 NOTE — Progress Notes (Signed)
 of NP seems to be too much and seems to be too low. With weight loss the may be enough. Or I can send 2 different doses to try to hit in the middle. Thoughts.   Still iron deficient. If not absorbing oral iron you may benefit from iron infusion. Will need to monitor after weight loss surgery.

## 2022-09-14 ENCOUNTER — Encounter: Payer: Self-pay | Admitting: Physician Assistant

## 2022-09-14 NOTE — Telephone Encounter (Signed)
I called patient and scheduled her an appointment.

## 2022-09-15 ENCOUNTER — Telehealth: Payer: Managed Care, Other (non HMO) | Admitting: Physician Assistant

## 2022-09-15 ENCOUNTER — Telehealth: Payer: Self-pay

## 2022-09-15 ENCOUNTER — Encounter: Payer: Self-pay | Admitting: Physician Assistant

## 2022-09-15 NOTE — Transitions of Care (Post Inpatient/ED Visit) (Signed)
   09/15/2022  Name: Jocelyn Taylor MRN: [id number] DOB: [birth year]  Today's TOC FU Call Status:    Attempted to reach the patient regarding the most recent Inpatient/ED visit. Patient already seen in office Follow Up Plan: No further outreach attempts will be made at this time. We have been unable to contact the patient. Karena Addison, LPN [facility] Nurse Health Advisor Direct Dial [phone]  Signature Karena Addison, LPN [facility] Nurse Health Advisor Direct Dial [phone]

## 2022-09-18 NOTE — Progress Notes (Signed)
This encounter was created in error - please disregard.

## 2022-09-20 ENCOUNTER — Telehealth: Payer: Self-pay | Admitting: Physician Assistant

## 2022-09-20 NOTE — Telephone Encounter (Signed)
Patient called in requesting a different dosage for NP Tyroid 90mg  instead of 120mg . She would like to have them today because she will be switching insurance coverages tomorrow.  [facility] [phone]

## 2022-10-02 MED ORDER — THYROID 90 MG PO TABS: 90.0000 mg | ORAL_TABLET | Freq: Every day | ORAL | 0 refills | Status: DC

## 2022-10-02 NOTE — Addendum Note (Signed)
Addended by: Ernest Mallick A on: 10/02/2022 04:52 PM   Modules accepted: Orders

## 2022-10-02 NOTE — Addendum Note (Signed)
Addended by: Jomarie Longs on: 10/02/2022 04:31 PM   Modules accepted: Orders

## 2022-11-01 ENCOUNTER — Encounter: Payer: Self-pay | Admitting: Physician Assistant

## 2022-11-01 DIAGNOSIS — E063 Autoimmune thyroiditis: Secondary | ICD-10-CM

## 2022-11-03 ENCOUNTER — Encounter: Payer: Self-pay | Admitting: Physician Assistant

## 2022-11-03 LAB — TSH+T4F+T3FREE
Free T4: 0.96 ng/dL (ref 0.82–1.77)
T3, Free: 3.3 pg/mL (ref 2.0–4.4)
TSH: 0.409 u[IU]/mL — ABNORMAL LOW (ref 0.450–4.500)

## 2022-11-03 NOTE — Progress Notes (Signed)
Free thyroid levels are good. If you are feeling ok then I would like to keep at same dose. Thoughts?

## 2022-11-07 MED ORDER — THYROID 90 MG PO TABS: 90.0000 mg | ORAL_TABLET | Freq: Every day | ORAL | 1 refills | Status: DC

## 2023-01-09 ENCOUNTER — Encounter: Payer: Self-pay | Admitting: Physician Assistant

## 2023-02-01 ENCOUNTER — Encounter: Payer: Self-pay | Admitting: Physician Assistant

## 2023-02-03 ENCOUNTER — Other Ambulatory Visit: Payer: Self-pay | Admitting: Physician Assistant

## 2023-03-16 DIAGNOSIS — Z6830 Body mass index (BMI) 30.0-30.9, adult: Secondary | ICD-10-CM | POA: Diagnosis not present

## 2023-03-16 DIAGNOSIS — Z1330 Encounter for screening examination for mental health and behavioral disorders, unspecified: Secondary | ICD-10-CM | POA: Diagnosis not present

## 2023-03-16 DIAGNOSIS — Z903 Acquired absence of stomach [part of]: Secondary | ICD-10-CM | POA: Diagnosis not present

## 2023-03-16 DIAGNOSIS — E66811 Obesity, class 1: Secondary | ICD-10-CM | POA: Diagnosis not present

## 2023-04-06 DIAGNOSIS — K912 Postsurgical malabsorption, not elsewhere classified: Secondary | ICD-10-CM | POA: Diagnosis not present

## 2023-04-06 DIAGNOSIS — Z903 Acquired absence of stomach [part of]: Secondary | ICD-10-CM | POA: Diagnosis not present

## 2023-04-27 ENCOUNTER — Encounter: Payer: Self-pay | Admitting: Physician Assistant

## 2023-04-30 MED ORDER — FREESTYLE LIBRE 14 DAY SENSOR MISC: 1.0000 | 11 refills | Status: AC

## 2023-06-05 DIAGNOSIS — M9903 Segmental and somatic dysfunction of lumbar region: Secondary | ICD-10-CM | POA: Diagnosis not present

## 2023-06-05 DIAGNOSIS — M9902 Segmental and somatic dysfunction of thoracic region: Secondary | ICD-10-CM | POA: Diagnosis not present

## 2023-06-05 DIAGNOSIS — M5413 Radiculopathy, cervicothoracic region: Secondary | ICD-10-CM | POA: Diagnosis not present

## 2023-06-05 DIAGNOSIS — M9901 Segmental and somatic dysfunction of cervical region: Secondary | ICD-10-CM | POA: Diagnosis not present

## 2023-06-06 ENCOUNTER — Ambulatory Visit (INDEPENDENT_AMBULATORY_CARE_PROVIDER_SITE_OTHER): Admitting: Physician Assistant

## 2023-06-06 ENCOUNTER — Encounter: Payer: Self-pay | Admitting: Physician Assistant

## 2023-06-06 VITALS — BP 138/75 | HR 74 | Ht 65.0 in | Wt 179.0 lb

## 2023-06-06 DIAGNOSIS — F909 Attention-deficit hyperactivity disorder, unspecified type: Secondary | ICD-10-CM | POA: Diagnosis not present

## 2023-06-06 DIAGNOSIS — I83813 Varicose veins of bilateral lower extremities with pain: Secondary | ICD-10-CM

## 2023-06-06 DIAGNOSIS — Z131 Encounter for screening for diabetes mellitus: Secondary | ICD-10-CM

## 2023-06-06 DIAGNOSIS — Z903 Acquired absence of stomach [part of]: Secondary | ICD-10-CM

## 2023-06-06 DIAGNOSIS — Z13220 Encounter for screening for lipoid disorders: Secondary | ICD-10-CM | POA: Diagnosis not present

## 2023-06-06 DIAGNOSIS — E663 Overweight: Secondary | ICD-10-CM

## 2023-06-06 DIAGNOSIS — Z0000 Encounter for general adult medical examination without abnormal findings: Secondary | ICD-10-CM

## 2023-06-06 DIAGNOSIS — E063 Autoimmune thyroiditis: Secondary | ICD-10-CM | POA: Diagnosis not present

## 2023-06-06 DIAGNOSIS — R790 Abnormal level of blood mineral: Secondary | ICD-10-CM | POA: Diagnosis not present

## 2023-06-06 MED ORDER — AMBULATORY NON FORMULARY MEDICATION: Status: DC

## 2023-06-06 NOTE — Patient Instructions (Addendum)
 [employer] for brain health  Start liposlim  Will make referral to vein  Health Maintenance, Female Adopting a healthy lifestyle and getting preventive care are important in promoting health and wellness. Ask your health care provider about: The right schedule for you to have regular tests and exams. Things you can do on your own to prevent diseases and keep yourself healthy. What should I know about diet, weight, and exercise? Eat a healthy diet  Eat a diet that includes plenty of vegetables, fruits, low-fat dairy products, and lean protein. Do not eat a lot of foods that are high in solid fats, added sugars, or sodium. Maintain a healthy weight Body mass index (BMI) is used to identify weight problems. It estimates body fat based on height and weight. Your health care provider can help determine your BMI and help you achieve or maintain a healthy weight. Get regular exercise Get regular exercise. This is one of the most important things you can do for your health. Most adults should: Exercise for at least 150 minutes each week. The exercise should increase your heart rate and make you sweat (moderate-intensity exercise). Do strengthening exercises at least twice a week. This is in addition to the moderate-intensity exercise. Spend less time sitting. Even light physical activity can be beneficial. Watch cholesterol and blood lipids Have your blood tested for lipids and cholesterol at 37 years of age, then have this test every 5 years. Have your cholesterol levels checked more often if: Your lipid or cholesterol levels are high. You are older than 37 years of age. You are at high risk for heart disease. What should I know about cancer screening? Depending on your health history and family history, you may need to have cancer screening at various ages. This may include screening for: Breast cancer. Cervical cancer. Colorectal cancer. Skin cancer. Lung cancer. What should I know  about heart disease, diabetes, and high blood pressure? Blood pressure and heart disease High blood pressure causes heart disease and increases the risk of stroke. This is more likely to develop in people who have high blood pressure readings or are overweight. Have your blood pressure checked: Every 3-5 years if you are 37-37 years of age. Every year if you are 37 years old or older. Diabetes Have regular diabetes screenings. This checks your fasting blood sugar level. Have the screening done: Once every three years after age 37 if you are at a normal weight and have a low risk for diabetes. More often and at a younger age if you are overweight or have a high risk for diabetes. What should I know about preventing infection? Hepatitis B If you have a higher risk for hepatitis B, you should be screened for this virus. Talk with your health care provider to find out if you are at risk for hepatitis B infection. Hepatitis C Testing is recommended for: Everyone born from [birth year] through 1964. Anyone with known risk factors for hepatitis C. Sexually transmitted infections (STIs) Get screened for STIs, including gonorrhea and chlamydia, if: You are sexually active and are younger than 37 years of age. You are older than 37 years of age and your health care provider tells you that you are at risk for this type of infection. Your sexual activity has changed since you were last screened, and you are at increased risk for chlamydia or gonorrhea. Ask your health care provider if you are at risk. Ask your health care provider about whether you are at high risk for  HIV. Your health care provider may recommend a prescription medicine to help prevent HIV infection. If you choose to take medicine to prevent HIV, you should first get tested for HIV. You should then be tested every 3 months for as long as you are taking the medicine. Pregnancy If you are about to stop having your period (premenopausal) and you  may become pregnant, seek counseling before you get pregnant. Take 400 to 800 micrograms (mcg) of folic acid every day if you become pregnant. Ask for birth control (contraception) if you want to prevent pregnancy. Osteoporosis and menopause Osteoporosis is a disease in which the bones lose minerals and strength with aging. This can result in bone fractures. If you are 37 years old or older, or if you are at risk for osteoporosis and fractures, ask your health care provider if you should: Be screened for bone loss. Take a calcium or vitamin D supplement to lower your risk of fractures. Be given hormone replacement therapy (HRT) to treat symptoms of menopause. Follow these instructions at home: Alcohol use Do not drink alcohol if: Your health care provider tells you not to drink. You are pregnant, may be pregnant, or are planning to become pregnant. If you drink alcohol: Limit how much you have to: 0-1 drink a day. Know how much alcohol is in your drink. In the [place] one drink equals one 12 oz bottle of beer (355 mL), one 5 oz glass of wine (148 mL), or one 1 oz glass of hard liquor (44 mL). Lifestyle Do not use any products that contain nicotine or tobacco. These products include cigarettes, chewing tobacco, and vaping devices, such as e-cigarettes. If you need help quitting, ask your health care provider. Do not use street drugs. Do not share needles. Ask your health care provider for help if you need support or information about quitting drugs. General instructions Schedule regular health, dental, and eye exams. Stay current with your vaccines. Tell your health care provider if: You often feel depressed. You have ever been abused or do not feel safe at home. Summary Adopting a healthy lifestyle and getting preventive care are important in promoting health and wellness. Follow your health care provider's instructions about healthy diet, exercising, and getting tested or screened for  diseases. Follow your health care provider's instructions on monitoring your cholesterol and blood pressure. This information is not intended to replace advice given to you by your health care provider. Make sure you discuss any questions you have with your health care provider. Document Revised: 06/28/2020 Document Reviewed: 06/28/2020 Elsevier Patient Education  2024 [employer].

## 2023-06-06 NOTE — Progress Notes (Signed)
 Complete physical exam  Patient: Jocelyn Taylor   DOB: 1985/05/08   38 y.o. Female  MRN: [id number]  Subjective:    Chief Complaint  Patient presents with   Annual Exam    Jocelyn Taylor is a 38 y.o. female who presents today for a complete physical exam. She reports consuming a general and low sugar and high protein  diet.  She walks regularly.   She generally feels well. She reports sleeping well. She does have additional problems to discuss today.   Pt is interested in starting a GLP-1 for weight control. She had gastric sleeve summer 2024 and lost 35lbs. She is still not quite to goal weight. She is wondering if microdosing GLP will help her get there.   She has varicose veins and would like referral for treatment. At times they are painful. She has not done anything to treat them.   Most recent fall risk assessment:    06/11/2023    7:15 AM  Fall Risk   Falls in the past year? 0  Number falls in past yr: 0  Injury with Fall? 0  Risk for fall due to : No Fall Risks  Follow up Falls evaluation completed     Most recent depression screenings:    06/11/2023    7:16 AM 10/19/2021    8:21 AM  PHQ 2/9 Scores  PHQ - 2 Score 0 0    Vision:Within last year and Dental: No current dental problems and Receives regular dental care  Patient Active Problem List   Diagnosis Date Noted   Varicose veins of both lower extremities with pain 06/11/2023   Low iron stores 09/12/2022   Insomnia 07/05/2022   No energy 07/05/2022   Class 2 obesity due to excess calories without serious comorbidity with body mass index (BMI) of 35.0 to 35.9 in adult 03/07/2021   Acute pain of both knees 03/07/2021   Sebaceous cyst 11/01/2020   Lumbar degenerative disc disease 06/18/2020   Great toe pain, right 05/21/2020   Chronic neck pain 11/17/2019   Myalgia 11/17/2019   Hemorrhoids 09/10/2018   ADHD (attention deficit hyperactivity disorder), inattentive type 12/23/2017   Skin nodule  02/10/2017   Idiopathic scoliosis 01/22/2015   Sacroiliac joint dysfunction of both sides 10/26/2014   Adult ADHD 06/23/2014   Anxiety 11/21/2013   Inattention 11/21/2013   Hashimoto's thyroiditis 11/12/2013   Acquired hypothyroidism 11/11/2013   Multiple thyroid  nodules 11/07/2013   Past Medical History:  Diagnosis Date   ADHD (attention deficit hyperactivity disorder)    Goiter    Past Surgical History:  Procedure Laterality Date   BREAST BIOPSY Right 06/2006   HEMORRHOID SURGERY  2020   LUMBAR LAMINECTOMY/DECOMPRESSION MICRODISCECTOMY Right 09/29/2020   Procedure: RIGHT-SIDED LUMBAR 5 - SACRUM 1 MICRODISECTOMY;  Surgeon: Virl Grimes, MD;  Location: [facility] OR;  Service: Orthopedics;  Laterality: Right;   TUBAL LIGATION Bilateral    No Known Allergies    Patient Care Team: Nyah Shepherd L, PA-C as PCP - General (Family Medicine)   Outpatient Medications Prior to Visit  Medication Sig   Continuous Glucose Sensor (FREESTYLE LIBRE 14 DAY SENSOR) MISC 1 Application by Does not apply route every 14 (fourteen) days. Apply upper deltoid every 14 days, use reader to determine blood sugars   ferrous sulfate  325 (65 FE) MG EC tablet Take 1 tablet (325 mg total) by mouth daily with breakfast.   Multiple Vitamins-Minerals (MULTIVITAMIN WITH MINERALS) tablet Take 1 tablet by mouth daily.  thyroid  (ARMOUR) 90 MG tablet TAKE 1 TABLET BY MOUTH EVERY DAY   [DISCONTINUED] atomoxetine  (STRATTERA ) 40 MG capsule Take one tablet daily for 2 weeks then increase to 2 tablets daily.   No facility-administered medications prior to visit.    Review of Systems  All other systems reviewed and are negative.         Objective:     BP 138/75   Pulse 74   Ht 5\' 5"  (1.651 m)   Wt 179 lb (81.2 kg)   SpO2 99%   BMI 29.79 kg/m  BP Readings from Last 3 Encounters:  06/06/23 138/75  10/19/21 129/71  03/02/21 122/80   Wt Readings from Last 3 Encounters:  06/06/23 179 lb (81.2 kg)  07/05/22  212 lb (96.2 kg)  10/19/21 207 lb 1.9 oz (93.9 kg)      Physical Exam  BP 138/75   Pulse 74   Ht 5\' 5"  (1.651 m)   Wt 179 lb (81.2 kg)   SpO2 99%   BMI 29.79 kg/m   General Appearance:    Alert, cooperative, no distress, appears stated age  Head:    Normocephalic, without obvious abnormality, atraumatic  Eyes:    PERRL, conjunctiva/corneas clear, EOM's intact, fundi    benign, both eyes  Ears:    Normal TM's and external ear canals, both ears  Nose:   Nares normal, septum midline, mucosa normal, no drainage    or sinus tenderness  Throat:   Lips, mucosa, and tongue normal; teeth and gums normal  Neck:   Supple, symmetrical, trachea midline, no adenopathy;    thyroid :  no enlargement/tenderness/nodules; no carotid   bruit or JVD  Back:     Symmetric, no curvature, ROM normal, no CVA tenderness  Lungs:     Clear to auscultation bilaterally, respirations unlabored  Chest Wall:    No tenderness or deformity   Heart:    Regular rate and rhythm, S1 and S2 normal, no murmur, rub   or gallop     Abdomen:     Soft, non-tender, bowel sounds active all four quadrants,    no masses, no organomegaly        Extremities:   Extremities normal, atraumatic, no cyanosis or edema, varicose veins of both legs  Pulses:   2+ and symmetric all extremities  Skin:   Skin color, texture, turgor normal, no rashes or lesions  Lymph nodes:   Cervical, supraclavicular, and axillary nodes normal  Neurologic:   CNII-XII intact, normal strength, sensation and reflexes    throughout      Assessment & Plan:    Routine Health Maintenance and Physical Exam  Immunization History  Administered Date(s) Administered   Influenza,inj,Quad PF,6+ Mos 01/22/2015   Influenza-Unspecified 01/22/2017    Health Maintenance  Topic Date Due   HIV Screening  Never done   DTaP/Tdap/Td (1 - Tdap) Never done   COVID-19 Vaccine (1) 06/27/2023 (Originally [birth year]/1991)   INFLUENZA VACCINE  09/21/2023   Cervical Cancer  Screening (HPV/Pap Cotest)  02/09/2026   Hepatitis C Screening  Completed   HPV VACCINES  Aged Out   Meningococcal B Vaccine  Aged Out    Discussed health benefits of physical activity, and encouraged her to engage in regular exercise appropriate for her age and condition.  Aaron AasLindaann Requena was seen today for annual exam.  Diagnoses and all orders for this visit:  Routine physical examination -     TSH + free T4 -  T3, free -     CMP14+EGFR -     Lipid panel -     B12 and Folate Panel -     VITAMIN D  25 Hydroxy (Vit-D Deficiency, Fractures) -     CBC w/Diff/Platelet -     Fe+TIBC+Fer  Hashimoto's thyroiditis -     TSH + free T4 -     T3, free  Low iron stores -     CBC w/Diff/Platelet -     Fe+TIBC+Fer  Adult ADHD  Screening for diabetes mellitus -     CMP14+EGFR  Screening for lipid disorders -     Lipid panel  Status post gastrectomy -     TSH + free T4 -     T3, free -     CMP14+EGFR -     Lipid panel -     B12 and Folate Panel -     VITAMIN D  25 Hydroxy (Vit-D Deficiency, Fractures) -     CBC w/Diff/Platelet -     Fe+TIBC+Fer  Overweight (BMI 25.0-29.9) -     AMBULATORY NON FORMULARY MEDICATION; 25 units of Liposlim weekly [place] injection.  Varicose veins of both lower extremities with pain -     Ambulatory referral to Vascular Surgery   .Aaron Aas Discussed 150 minutes of exercise a week.  Encouraged vitamin D  1000 units and Calcium 1300mg  or 4 servings of dairy a day.  Fasting labs ordered PHQ no concerns Discussed nootropics for brain and focus Compounded microdose of [facility]lim from [facility] prescribed Discussed side effects of GLP-1s Discussed need for Tdap Declined covid booster Follow up in 6 months  Weight loss could help with varicose veins but most important thing will be compression Discussed compression socks HO given Referral made to vein clinic   Sandy Crumb, PA-C

## 2023-06-11 ENCOUNTER — Encounter: Payer: Self-pay | Admitting: Physician Assistant

## 2023-06-11 DIAGNOSIS — I83813 Varicose veins of bilateral lower extremities with pain: Secondary | ICD-10-CM | POA: Insufficient documentation

## 2023-06-11 DIAGNOSIS — E538 Deficiency of other specified B group vitamins: Secondary | ICD-10-CM | POA: Insufficient documentation

## 2023-06-11 NOTE — Progress Notes (Signed)
 Jocelyn Taylor,   Iron and iron stores look much better.  Hemoglobin normal.   Vitamin D  looks great.   Kidney, liver, glucose looks GREAT.   LDL, bad cholesterol, not to goal and elevated.  HDL, good cholesterol, looks great.  Continue for now to work on diet and exercise. Recheck in another year. If no improvement may need to consider cholesterol medication.   Thyroid  is off a bit.  Are you taking selenium 200mcg daily.  I would like to back off a little of armour thyroid  to decrease t3 levels potentially. Thoughts?     B12 is low. I would start b12 monthly shots and recheck in 3 months. Are you ok with this?

## 2023-06-12 ENCOUNTER — Encounter: Payer: Self-pay | Admitting: Physician Assistant

## 2023-06-13 ENCOUNTER — Other Ambulatory Visit: Payer: Self-pay

## 2023-06-13 DIAGNOSIS — E663 Overweight: Secondary | ICD-10-CM

## 2023-06-13 MED ORDER — TIRZEPATIDE 10 MG/0.5ML ~~LOC~~ SOAJ: SUBCUTANEOUS | 11 refills | Status: DC

## 2023-06-13 NOTE — Telephone Encounter (Signed)
 Was this sent to [facility]?

## 2023-06-13 NOTE — Telephone Encounter (Signed)
 Attached prescription to be printed  Needing additional information added before printing

## 2023-06-13 NOTE — Telephone Encounter (Signed)
 Copied from CRM [id number]. Topic: Clinical - Prescription Issue >> Jun 13, 2023  4:16 PM Blair Bumpers wrote: Reason for CRM: Carnella Christians, from [facility], called stating that the patient called them about a prescription. She stated the provider was suppose to send in a prescription for Lipo Slim but the pharmacy hasn't received anything. Raina would like for the prescription to be faxed to them at [phone].

## 2023-06-14 ENCOUNTER — Telehealth: Payer: Self-pay

## 2023-06-14 DIAGNOSIS — M5413 Radiculopathy, cervicothoracic region: Secondary | ICD-10-CM | POA: Diagnosis not present

## 2023-06-14 DIAGNOSIS — M9902 Segmental and somatic dysfunction of thoracic region: Secondary | ICD-10-CM | POA: Diagnosis not present

## 2023-06-14 DIAGNOSIS — M9901 Segmental and somatic dysfunction of cervical region: Secondary | ICD-10-CM | POA: Diagnosis not present

## 2023-06-14 DIAGNOSIS — M9903 Segmental and somatic dysfunction of lumbar region: Secondary | ICD-10-CM | POA: Diagnosis not present

## 2023-06-14 NOTE — Telephone Encounter (Signed)
 Called [facility] Held for 10 minutes and then was connected to automated system requesting a message be left  Left detailed voice mail message and direct call back number requesting a return call.

## 2023-06-14 NOTE — Telephone Encounter (Signed)
 Copied from CRM [id number]. Topic: Clinical - Prescription Issue >> Jun 14, 2023  1:57 PM Arlie Benedict B wrote: Reason for CRM: [facility]  Phone: [phone] Fax: [phone]  Patient states she is having some issues with getting her prescription Lipo Slim filled due to some paperwork that needs to be completed. Patient is requesting a call back to update the status of the prescription issue. She stated she'd been calling since last week. To reach the patient please call [phone]

## 2023-06-15 ENCOUNTER — Other Ambulatory Visit: Payer: Self-pay

## 2023-06-15 DIAGNOSIS — E663 Overweight: Secondary | ICD-10-CM

## 2023-06-15 MED ORDER — AMBULATORY NON FORMULARY MEDICATION: 0 refills | Status: DC

## 2023-06-15 NOTE — Telephone Encounter (Signed)
 Spoke with [facility] - states no paperwork needed but does need to have rx refaxed as was not received.  Resent prescription  to fax# [phone]

## 2023-06-18 LAB — CMP14+EGFR
ALT: 14 IU/L (ref 0–32)
AST: 18 IU/L (ref 0–40)
Albumin: 4.7 g/dL (ref 3.9–4.9)
Alkaline Phosphatase: 71 IU/L (ref 44–121)
BUN/Creatinine Ratio: 17 (ref 9–23)
BUN: 12 mg/dL (ref 6–20)
Bilirubin Total: 0.4 mg/dL (ref 0.0–1.2)
CO2: 20 mmol/L (ref 20–29)
Calcium: 9.4 mg/dL (ref 8.7–10.2)
Chloride: 102 mmol/L (ref 96–106)
Creatinine, Ser: 0.71 mg/dL (ref 0.57–1.00)
Globulin, Total: 2.8 g/dL (ref 1.5–4.5)
Glucose: 83 mg/dL (ref 70–99)
Potassium: 4.2 mmol/L (ref 3.5–5.2)
Sodium: 139 mmol/L (ref 134–144)
Total Protein: 7.5 g/dL (ref 6.0–8.5)
eGFR: 113 mL/min/{1.73_m2} (ref >59)

## 2023-06-18 LAB — LIPID PANEL
Chol/HDL Ratio: 3.9 ratio (ref 0.0–4.4)
Cholesterol, Total: 236 mg/dL — ABNORMAL HIGH (ref 100–199)
HDL: 60 mg/dL (ref >39)
LDL Chol Calc (NIH): 163 mg/dL — ABNORMAL HIGH (ref 0–99)
Triglycerides: 77 mg/dL (ref 0–149)
VLDL Cholesterol Cal: 13 mg/dL (ref 5–40)

## 2023-06-18 LAB — IRON,TIBC AND FERRITIN PANEL
Ferritin: 20 ng/mL (ref 15–150)
Iron Saturation: 21 % (ref 15–55)
Iron: 91 ug/dL (ref 27–159)
Total Iron Binding Capacity: 442 ug/dL (ref 250–450)
UIBC: 351 ug/dL (ref 131–425)

## 2023-06-18 LAB — CBC WITH DIFFERENTIAL/PLATELET
Basophils Absolute: 0 10*3/uL (ref 0.0–0.2)
Basos: 1 %
EOS (ABSOLUTE): 0.2 10*3/uL (ref 0.0–0.4)
Eos: 3 %
Hematocrit: 44.6 % (ref 34.0–46.6)
Hemoglobin: 15 g/dL (ref 11.1–15.9)
Immature Grans (Abs): 0 10*3/uL (ref 0.0–0.1)
Immature Granulocytes: 0 %
Lymphocytes Absolute: 1.5 10*3/uL (ref 0.7–3.1)
Lymphs: 26 %
MCH: 32.1 pg (ref 26.6–33.0)
MCHC: 33.6 g/dL (ref 31.5–35.7)
MCV: 95 fL (ref 79–97)
Monocytes Absolute: 0.5 10*3/uL (ref 0.1–0.9)
Monocytes: 8 %
Neutrophils Absolute: 3.5 10*3/uL (ref 1.4–7.0)
Neutrophils: 62 %
Platelets: 307 10*3/uL (ref 150–450)
RBC: 4.68 x10E6/uL (ref 3.77–5.28)
RDW: 12.4 % (ref 11.7–15.4)
WBC: 5.8 10*3/uL (ref 3.4–10.8)

## 2023-06-18 LAB — T3, FREE: T3, Free: 6.3 pg/mL — ABNORMAL HIGH (ref 2.0–4.4)

## 2023-06-18 LAB — TSH+FREE T4
Free T4: 0.91 ng/dL (ref 0.82–1.77)
TSH: 5.92 u[IU]/mL — ABNORMAL HIGH (ref 0.450–4.500)

## 2023-06-18 LAB — B12 AND FOLATE PANEL: Vitamin B-12: 214 pg/mL — ABNORMAL LOW (ref 232–1245)

## 2023-06-18 LAB — VITAMIN D 25 HYDROXY (VIT D DEFICIENCY, FRACTURES): Vit D, 25-Hydroxy: 48 ng/mL (ref 30.0–100.0)

## 2023-06-19 ENCOUNTER — Encounter: Payer: Self-pay | Admitting: Physician Assistant

## 2023-06-20 DIAGNOSIS — M79662 Pain in left lower leg: Secondary | ICD-10-CM | POA: Diagnosis not present

## 2023-06-20 DIAGNOSIS — M79661 Pain in right lower leg: Secondary | ICD-10-CM | POA: Diagnosis not present

## 2023-06-20 DIAGNOSIS — M79604 Pain in right leg: Secondary | ICD-10-CM | POA: Diagnosis not present

## 2023-06-20 DIAGNOSIS — I872 Venous insufficiency (chronic) (peripheral): Secondary | ICD-10-CM | POA: Diagnosis not present

## 2023-06-20 DIAGNOSIS — I83893 Varicose veins of bilateral lower extremities with other complications: Secondary | ICD-10-CM | POA: Diagnosis not present

## 2023-06-20 MED ORDER — PREDNISONE 50 MG PO TABS: ORAL_TABLET | ORAL | 0 refills | Status: DC

## 2023-06-21 DIAGNOSIS — M9903 Segmental and somatic dysfunction of lumbar region: Secondary | ICD-10-CM | POA: Diagnosis not present

## 2023-06-21 DIAGNOSIS — M9901 Segmental and somatic dysfunction of cervical region: Secondary | ICD-10-CM | POA: Diagnosis not present

## 2023-06-21 DIAGNOSIS — M9902 Segmental and somatic dysfunction of thoracic region: Secondary | ICD-10-CM | POA: Diagnosis not present

## 2023-06-21 DIAGNOSIS — M5413 Radiculopathy, cervicothoracic region: Secondary | ICD-10-CM | POA: Diagnosis not present

## 2023-07-05 DIAGNOSIS — M9903 Segmental and somatic dysfunction of lumbar region: Secondary | ICD-10-CM | POA: Diagnosis not present

## 2023-07-05 DIAGNOSIS — M5413 Radiculopathy, cervicothoracic region: Secondary | ICD-10-CM | POA: Diagnosis not present

## 2023-07-05 DIAGNOSIS — M9901 Segmental and somatic dysfunction of cervical region: Secondary | ICD-10-CM | POA: Diagnosis not present

## 2023-07-05 DIAGNOSIS — M9902 Segmental and somatic dysfunction of thoracic region: Secondary | ICD-10-CM | POA: Diagnosis not present

## 2023-07-11 DIAGNOSIS — M9903 Segmental and somatic dysfunction of lumbar region: Secondary | ICD-10-CM | POA: Diagnosis not present

## 2023-07-11 DIAGNOSIS — M5413 Radiculopathy, cervicothoracic region: Secondary | ICD-10-CM | POA: Diagnosis not present

## 2023-07-11 DIAGNOSIS — M9902 Segmental and somatic dysfunction of thoracic region: Secondary | ICD-10-CM | POA: Diagnosis not present

## 2023-07-11 DIAGNOSIS — M9901 Segmental and somatic dysfunction of cervical region: Secondary | ICD-10-CM | POA: Diagnosis not present

## 2023-07-17 ENCOUNTER — Encounter: Payer: Self-pay | Admitting: Physician Assistant

## 2023-07-18 MED ORDER — THYROID 120 MG PO TABS: 120.0000 mg | ORAL_TABLET | Freq: Every day | ORAL | 0 refills | Status: DC

## 2023-07-31 ENCOUNTER — Other Ambulatory Visit: Payer: Self-pay | Admitting: Physician Assistant

## 2023-08-01 ENCOUNTER — Other Ambulatory Visit: Payer: Self-pay

## 2023-08-01 DIAGNOSIS — E663 Overweight: Secondary | ICD-10-CM

## 2023-08-01 DIAGNOSIS — I83891 Varicose veins of right lower extremities with other complications: Secondary | ICD-10-CM | POA: Diagnosis not present

## 2023-08-01 MED ORDER — AMBULATORY NON FORMULARY MEDICATION: 0 refills | Status: AC

## 2023-08-01 NOTE — Telephone Encounter (Signed)
 Ok will send over 50 units for 12 weeks.

## 2023-08-01 NOTE — Telephone Encounter (Signed)
 Requesting rx rf of Liposlim  Last written 06/15/2023 Last OV 06/06/2023 Upcoming appt 09/05/2023

## 2023-08-01 NOTE — Telephone Encounter (Signed)
 Copied from CRM [id number]. Topic: Clinical - Prescription Issue >> Jul 31, 2023  3:56 PM Tisa Forester wrote: Reason for CRM: Brittney calling from  compounding [facility]  [facility] [phone] fax over a refill prescription  07/17/23 for the  Liposlim checking on the status  Patient want to be able to pick up by this Thursday   [facility] [address] [place] [address] Phone: [phone] Fax: [phone] Hours: Unknown hours of operation

## 2023-08-02 DIAGNOSIS — M9901 Segmental and somatic dysfunction of cervical region: Secondary | ICD-10-CM | POA: Diagnosis not present

## 2023-08-02 DIAGNOSIS — M5413 Radiculopathy, cervicothoracic region: Secondary | ICD-10-CM | POA: Diagnosis not present

## 2023-08-02 DIAGNOSIS — M9903 Segmental and somatic dysfunction of lumbar region: Secondary | ICD-10-CM | POA: Diagnosis not present

## 2023-08-02 DIAGNOSIS — M9902 Segmental and somatic dysfunction of thoracic region: Secondary | ICD-10-CM | POA: Diagnosis not present

## 2023-08-02 NOTE — Telephone Encounter (Signed)
 Medication was faxed to [facility] and  confirmation fax received. ( Found in [employer] of completed work)

## 2023-08-08 DIAGNOSIS — Z09 Encounter for follow-up examination after completed treatment for conditions other than malignant neoplasm: Secondary | ICD-10-CM | POA: Diagnosis not present

## 2023-08-08 DIAGNOSIS — I83891 Varicose veins of right lower extremities with other complications: Secondary | ICD-10-CM | POA: Diagnosis not present

## 2023-08-08 DIAGNOSIS — I83892 Varicose veins of left lower extremities with other complications: Secondary | ICD-10-CM | POA: Diagnosis not present

## 2023-08-09 ENCOUNTER — Encounter: Payer: Self-pay | Admitting: Family Medicine

## 2023-08-09 ENCOUNTER — Ambulatory Visit

## 2023-08-09 ENCOUNTER — Ambulatory Visit (INDEPENDENT_AMBULATORY_CARE_PROVIDER_SITE_OTHER): Admitting: Sports Medicine

## 2023-08-09 ENCOUNTER — Encounter: Payer: Self-pay | Admitting: Sports Medicine

## 2023-08-09 DIAGNOSIS — G8929 Other chronic pain: Secondary | ICD-10-CM

## 2023-08-09 DIAGNOSIS — I83891 Varicose veins of right lower extremities with other complications: Secondary | ICD-10-CM | POA: Diagnosis not present

## 2023-08-09 DIAGNOSIS — M542 Cervicalgia: Secondary | ICD-10-CM | POA: Diagnosis not present

## 2023-08-09 DIAGNOSIS — Z0189 Encounter for other specified special examinations: Secondary | ICD-10-CM | POA: Diagnosis not present

## 2023-08-09 MED ORDER — TIZANIDINE HCL 4 MG PO TABS: 4.0000 mg | ORAL_TABLET | Freq: Three times a day (TID) | ORAL | 0 refills | Status: DC

## 2023-08-09 MED ORDER — PREDNISONE 50 MG PO TABS: ORAL_TABLET | ORAL | 0 refills | Status: DC

## 2023-08-09 NOTE — Progress Notes (Signed)
    Procedures performed today:    None.  Independent interpretation of notes and tests performed by another provider:   None.  Brief History, Exam, Impression, and Recommendations:    Chronic neck pain This is a very pleasant 38 year old female, for the past month she has had neck pain, and for the past 2 weeks the neck pain has become more severe, she has been locked down, spasm and unable to turn her neck. Pain does radiate into the trapezii bilaterally, some to the periscapular region but nothing overtly radicular down to the hand or fingertips. On exam her neck is held stiff, she is unable to turn left or right, she does have some tenderness to palpation along the paracervical musculature and trapezii. Upper extremity exam is normal with good strength, good motion, normal reflexes and a negative Hoffmann sign. We will start conservatively with prednisone , Zanaflex as she has failed methocarbamol  and Flexeril  in the past. Home physical therapy, x-rays, return in 6 weeks.    ____________________________________________ Joselyn Nicely. Sandy Crumb, M.D., ABFM., CAQSM., AME. Primary Care and Sports Medicine [facility]  Adjunct Professor of [facility] Medicine  [employer]  Restaurant manager, fast food

## 2023-08-09 NOTE — Assessment & Plan Note (Signed)
 This is a very pleasant 38 year old female, for the past month she has had neck pain, and for the past 2 weeks the neck pain has become more severe, she has been locked down, spasm and unable to turn her neck. Pain does radiate into the trapezii bilaterally, some to the periscapular region but nothing overtly radicular down to the hand or fingertips. On exam her neck is held stiff, she is unable to turn left or right, she does have some tenderness to palpation along the paracervical musculature and trapezii. Upper extremity exam is normal with good strength, good motion, normal reflexes and a negative Hoffmann sign. We will start conservatively with prednisone , Zanaflex as she has failed methocarbamol  and Flexeril  in the past. Home physical therapy, x-rays, return in 6 weeks.

## 2023-08-15 DIAGNOSIS — Z01419 Encounter for gynecological examination (general) (routine) without abnormal findings: Secondary | ICD-10-CM | POA: Diagnosis not present

## 2023-08-20 ENCOUNTER — Encounter: Payer: Self-pay | Admitting: Obstetrics and Gynecology

## 2023-08-20 ENCOUNTER — Ambulatory Visit: Payer: Self-pay | Admitting: Sports Medicine

## 2023-08-20 LAB — HM PAP SMEAR

## 2023-08-23 DIAGNOSIS — I83891 Varicose veins of right lower extremities with other complications: Secondary | ICD-10-CM | POA: Diagnosis not present

## 2023-09-04 ENCOUNTER — Encounter: Payer: Self-pay | Admitting: Physician Assistant

## 2023-09-05 ENCOUNTER — Ambulatory Visit (INDEPENDENT_AMBULATORY_CARE_PROVIDER_SITE_OTHER): Admitting: Physician Assistant

## 2023-09-05 ENCOUNTER — Encounter: Payer: Self-pay | Admitting: Physician Assistant

## 2023-09-05 VITALS — BP 109/67 | HR 67 | Ht 65.0 in | Wt 155.0 lb

## 2023-09-05 DIAGNOSIS — Z9884 Bariatric surgery status: Secondary | ICD-10-CM | POA: Insufficient documentation

## 2023-09-05 DIAGNOSIS — R790 Abnormal level of blood mineral: Secondary | ICD-10-CM

## 2023-09-05 DIAGNOSIS — E063 Autoimmune thyroiditis: Secondary | ICD-10-CM

## 2023-09-05 DIAGNOSIS — Z13220 Encounter for screening for lipoid disorders: Secondary | ICD-10-CM | POA: Diagnosis not present

## 2023-09-05 DIAGNOSIS — E538 Deficiency of other specified B group vitamins: Secondary | ICD-10-CM | POA: Diagnosis not present

## 2023-09-05 DIAGNOSIS — E663 Overweight: Secondary | ICD-10-CM | POA: Insufficient documentation

## 2023-09-05 DIAGNOSIS — Z8639 Personal history of other endocrine, nutritional and metabolic disease: Secondary | ICD-10-CM | POA: Insufficient documentation

## 2023-09-05 NOTE — Patient Instructions (Signed)
 Continue on liposlim, MVI, vitamin D , selenium. Will get labs today.

## 2023-09-05 NOTE — Progress Notes (Signed)
   Established Patient Office Visit  Subjective   Patient ID: Jocelyn Taylor, female    DOB: [birth year]  Age: 38 y.o. MRN: [id number]  Chief Complaint  Patient presents with   Medical Management of Chronic Issues    HPI Pt is a 38 yo female who presents to the clinic for medication follow up.   She is 1 year past gastric sleeve surgery and on liposlim compounded. She is doing well. She takes almost 50 units weekly. She has lost 23lbs since April and feeling great. 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 She is taking b12 and iron supplement in [facility]. She wold like labs rechecked today. Since her labs were off some at last recheck. She needs her labs for her follow up with bariatric as well.   She continues to take her armour thyroid  daily.   Review of Systems  All other systems reviewed and are negative.     Objective:     BP 109/67   Pulse 67   Ht 5' 5 (1.651 m)   Wt 155 lb (70.3 kg)   SpO2 99%   BMI 25.79 kg/m  BP Readings from Last 3 Encounters:  09/05/23 109/67  06/06/23 138/75  10/19/21 129/71   Wt Readings from Last 3 Encounters:  09/05/23 155 lb (70.3 kg)  06/06/23 179 lb (81.2 kg)  07/05/22 212 lb (96.2 kg)      Physical Exam Constitutional:      Appearance: Normal appearance.  HENT:     Head: Normocephalic.  Cardiovascular:     Rate and Rhythm: Normal rate and regular rhythm.  Pulmonary:     Effort: Pulmonary effort is normal.  Neurological:     Mental Status: She is alert and oriented to person, place, and time.         Assessment & Plan:  SABRASABRANicey was seen today for medical management of chronic issues.  Diagnoses and all orders for this visit:  Hashimoto's thyroiditis -     TSH + free T4 -     T3, free  B12 deficiency -     B12 and Folate Panel  S/P laparoscopic sleeve gastrectomy -     B12 and Folate Panel -     VITAMIN D  25 Hydroxy (Vit-D Deficiency, Fractures) -     CMP14+EGFR -     PTH, Intact and Calcium -      Fe+TIBC+Fer -     CBC w/Diff/Platelet -     Vitamin B1 -     Magnesium  -     Lipid panel -     TSH + free T4 -     T3, free  Low iron stores -     Fe+TIBC+Fer  Screening for lipid disorders -     Lipid panel  Overweight (BMI 25.0-29.9)  History of obesity   Vitals and weight look great Continue on liposlim 50 units weekly Labs ordered for follow up after bariatric surgery and thyroid  Will adjust medications accordingly   Return in about 6 months (around 03/07/2024).    Erva Koke, PA-C

## 2023-09-06 ENCOUNTER — Ambulatory Visit: Payer: Self-pay | Admitting: Physician Assistant

## 2023-09-06 DIAGNOSIS — I83891 Varicose veins of right lower extremities with other complications: Secondary | ICD-10-CM | POA: Diagnosis not present

## 2023-09-06 DIAGNOSIS — I87391 Chronic venous hypertension (idiopathic) with other complications of right lower extremity: Secondary | ICD-10-CM | POA: Diagnosis not present

## 2023-09-06 NOTE — Progress Notes (Signed)
 Ok she will need one nurse visit for a nurse to show her how and get supplies then we can send everything she needs.

## 2023-09-06 NOTE — Progress Notes (Signed)
 Jocelyn Taylor,   Thyroid  looks GREAT.  Magnesium  normal.  Iron panel looks good.  Vitamin d  looks GREAT.  LDL improved! Great news.  Kidney, liver, glucose look good.  B12 still low. I think you should try the monthly shots. You could learn to give them to yourself.

## 2023-09-09 LAB — LIPID PANEL
Chol/HDL Ratio: 3.6 ratio (ref 0.0–4.4)
Cholesterol, Total: 205 mg/dL — ABNORMAL HIGH (ref 100–199)
HDL: 57 mg/dL (ref >39)
LDL Chol Calc (NIH): 132 mg/dL — ABNORMAL HIGH (ref 0–99)
Triglycerides: 92 mg/dL (ref 0–149)
VLDL Cholesterol Cal: 16 mg/dL (ref 5–40)

## 2023-09-09 LAB — CMP14+EGFR
ALT: 13 IU/L (ref 0–32)
AST: 18 IU/L (ref 0–40)
Albumin: 4.8 g/dL (ref 3.9–4.9)
Alkaline Phosphatase: 61 IU/L (ref 44–121)
BUN/Creatinine Ratio: 15 (ref 9–23)
BUN: 10 mg/dL (ref 6–20)
Bilirubin Total: 0.7 mg/dL (ref 0.0–1.2)
CO2: 17 mmol/L — ABNORMAL LOW (ref 20–29)
Calcium: 9.7 mg/dL (ref 8.7–10.2)
Chloride: 102 mmol/L (ref 96–106)
Creatinine, Ser: 0.67 mg/dL (ref 0.57–1.00)
Globulin, Total: 3 g/dL (ref 1.5–4.5)
Glucose: 71 mg/dL (ref 70–99)
Potassium: 4.4 mmol/L (ref 3.5–5.2)
Sodium: 137 mmol/L (ref 134–144)
Total Protein: 7.8 g/dL (ref 6.0–8.5)
eGFR: 116 mL/min/1.73 (ref >59)

## 2023-09-09 LAB — TSH+FREE T4
Free T4: 1.02 ng/dL (ref 0.82–1.77)
TSH: 1.25 u[IU]/mL (ref 0.450–4.500)

## 2023-09-09 LAB — PTH, INTACT AND CALCIUM: PTH: 22 pg/mL (ref 15–65)

## 2023-09-09 LAB — B12 AND FOLATE PANEL
Folate: 17.2 ng/mL (ref >3.0)
Vitamin B-12: 217 pg/mL — ABNORMAL LOW (ref 232–1245)

## 2023-09-09 LAB — CBC WITH DIFFERENTIAL/PLATELET
Basophils Absolute: 0 x10E3/uL (ref 0.0–0.2)
Basos: 1 %
EOS (ABSOLUTE): 0.2 x10E3/uL (ref 0.0–0.4)
Eos: 3 %
Hematocrit: 45.9 % (ref 34.0–46.6)
Hemoglobin: 14.6 g/dL (ref 11.1–15.9)
Immature Grans (Abs): 0 x10E3/uL (ref 0.0–0.1)
Immature Granulocytes: 0 %
Lymphocytes Absolute: 1.6 x10E3/uL (ref 0.7–3.1)
Lymphs: 24 %
MCH: 31.5 pg (ref 26.6–33.0)
MCHC: 31.8 g/dL (ref 31.5–35.7)
MCV: 99 fL — ABNORMAL HIGH (ref 79–97)
Monocytes Absolute: 0.4 x10E3/uL (ref 0.1–0.9)
Monocytes: 7 %
Neutrophils Absolute: 4.3 x10E3/uL (ref 1.4–7.0)
Neutrophils: 65 %
Platelets: 339 x10E3/uL (ref 150–450)
RBC: 4.64 x10E6/uL (ref 3.77–5.28)
RDW: 13.1 % (ref 11.7–15.4)
WBC: 6.6 x10E3/uL (ref 3.4–10.8)

## 2023-09-09 LAB — IRON,TIBC AND FERRITIN PANEL
Ferritin: 48 ng/mL (ref 15–150)
Iron Saturation: 38 % (ref 15–55)
Iron: 138 ug/dL (ref 27–159)
Total Iron Binding Capacity: 362 ug/dL (ref 250–450)
UIBC: 224 ug/dL (ref 131–425)

## 2023-09-09 LAB — MAGNESIUM: Magnesium: 2.2 mg/dL (ref 1.6–2.3)

## 2023-09-09 LAB — VITAMIN D 25 HYDROXY (VIT D DEFICIENCY, FRACTURES): Vit D, 25-Hydroxy: 80.7 ng/mL (ref 30.0–100.0)

## 2023-09-09 LAB — T3, FREE: T3, Free: 2.5 pg/mL (ref 2.0–4.4)

## 2023-09-09 LAB — VITAMIN B1: Thiamine: 132.3 nmol/L (ref 66.5–200.0)

## 2023-09-10 ENCOUNTER — Telehealth: Payer: Self-pay

## 2023-09-10 ENCOUNTER — Ambulatory Visit (INDEPENDENT_AMBULATORY_CARE_PROVIDER_SITE_OTHER)

## 2023-09-10 VITALS — BP 108/68 | HR 76 | Ht 65.0 in

## 2023-09-10 DIAGNOSIS — E538 Deficiency of other specified B group vitamins: Secondary | ICD-10-CM

## 2023-09-10 MED ORDER — "BD BLUNT FILL NEEDLE 18G X 1-1/2"" MISC": 1.0000 | 0 refills | Status: DC

## 2023-09-10 MED ORDER — "SYRINGE 25G X 1"" 3 ML MISC": 1.0000 | 0 refills | Status: DC

## 2023-09-10 MED ORDER — CYANOCOBALAMIN 1000 MCG/ML IJ SOLN: 1000.0000 ug | Freq: Once | INTRAMUSCULAR | 1 refills | Status: DC

## 2023-09-10 MED ORDER — CYANOCOBALAMIN 1000 MCG/ML IJ SOLN
1000.0000 ug | Freq: Once | INTRAMUSCULAR | Status: AC
  Administered 2023-09-10: 1000 ug via INTRAMUSCULAR

## 2023-09-10 NOTE — Telephone Encounter (Signed)
 Patient came into office and was instructed on self administration of IM  B12 injections.  How often should the patient have the injections initially? She also wanted to see if she could look into the B12 sublingual drops?- if so what strength should she use?

## 2023-09-10 NOTE — Telephone Encounter (Signed)
 Once a month for b12 injections. I would suggest getting you to normal range with shots and then trying to maintain with sublingual drops. Kim, did you send the supplies for her?

## 2023-09-10 NOTE — Progress Notes (Signed)
   Established Patient Office Visit  Subjective   Patient ID: Jocelyn Taylor, female    DOB: 1985-05-26  Age: 38 y.o. MRN: [id number]  Chief Complaint  Patient presents with   b12 deficiency    B12 injection - in office instruction on self administration.    HPI  Vitamin B12 deficiency.  B12 injection administration with in office instruction on self administration.   ROS    Objective:     BP 108/68   Pulse 76   Ht 5' 5 (1.651 m)   SpO2 99%   BMI 25.79 kg/m    Physical Exam   No results found for any visits on 09/10/23.    The ASCVD Risk score (Arnett DK, et al., 2019) failed to calculate for the following reasons:   The 2019 ASCVD risk score is only valid for ages 95 to 67    Assessment & Plan:  Instructed patient on self administration of B12 injection.  Instructed patient to  gather all supplies beforehand, check right medication and that med has not expired,. Clean top of vial after removing cap. instructed on correct method to draw up medication into syringe using  a separate needle  than injecting with , remove drawing needle, expel excess air from syringe and be sure that no bubbles are present in solution. Place injecting needle onto syringe. Cleanse injection site with alcohol pad.  Injection into middle third of thigh . Remove syringe and place band aid at site. Informed to rotate injections sites with each injection. B12 injections every 30 days until B12 lab levels are within normal range and then will use sublingual drops for maintenance per [name]. Supplies ordered for patient home injections monthly.  Problem List Items Addressed This Visit       Other   B12 deficiency - Primary    No follow-ups on file.    Suzen SHAUNNA Plenty, LPN

## 2023-09-10 NOTE — Patient Instructions (Signed)
 Patient given supplies to self inject B12 injections  - 1ml every 30 days.

## 2023-09-11 NOTE — Telephone Encounter (Signed)
 Prescriptions were pended and sent in by Vermell Bologna, PA

## 2023-09-13 DIAGNOSIS — Z903 Acquired absence of stomach [part of]: Secondary | ICD-10-CM | POA: Diagnosis not present

## 2023-09-13 DIAGNOSIS — Z6826 Body mass index (BMI) 26.0-26.9, adult: Secondary | ICD-10-CM | POA: Diagnosis not present

## 2023-09-17 ENCOUNTER — Ambulatory Visit (INDEPENDENT_AMBULATORY_CARE_PROVIDER_SITE_OTHER): Admitting: Sports Medicine

## 2023-09-17 DIAGNOSIS — G8929 Other chronic pain: Secondary | ICD-10-CM | POA: Diagnosis not present

## 2023-09-17 DIAGNOSIS — M542 Cervicalgia: Secondary | ICD-10-CM

## 2023-09-17 NOTE — Progress Notes (Signed)
    Procedures performed today:    None.  Independent interpretation of notes and tests performed by another provider:   None.  Brief History, Exam, Impression, and Recommendations:    Chronic neck pain This is an exquisitely pleasant 38 year old female, we saw her mid last month, she had 2 weeks of severe neck pain, walked down, spasm been unable to turn her neck with radiation into the to the trapezii bilaterally as well as the periscapular region but nothing overtly radicular down to the hand or fingertips. We treated her conservatively with prednisone , Zanaflex , home conditioning. Zanaflex  has worked really well, prednisone  worked well, home PT worked well, she has had resolution of her symptoms. Return to see me as needed.    ____________________________________________ Debby PARAS. Curtis, M.D., ABFM., CAQSM., AME. Primary Care and Sports Medicine [place]  Adjunct Professor of [facility] Medicine  [employer]  Restaurant manager, fast food

## 2023-09-17 NOTE — Assessment & Plan Note (Signed)
 This is an exquisitely pleasant 39 year old female, we saw her mid last month, she had 2 weeks of severe neck pain, walked down, spasm been unable to turn her neck with radiation into the to the trapezii bilaterally as well as the periscapular region but nothing overtly radicular down to the hand or fingertips. We treated her conservatively with prednisone , Zanaflex , home conditioning. Zanaflex  has worked really well, prednisone  worked well, home PT worked well, she has had resolution of her symptoms. Return to see me as needed.

## 2023-10-18 ENCOUNTER — Encounter: Payer: Self-pay | Admitting: Physician Assistant

## 2023-10-23 ENCOUNTER — Encounter: Payer: Self-pay | Admitting: Sports Medicine

## 2023-10-28 ENCOUNTER — Other Ambulatory Visit: Payer: Self-pay | Admitting: Physician Assistant

## 2023-10-31 ENCOUNTER — Encounter: Payer: Self-pay | Admitting: Physician Assistant

## 2023-10-31 MED ORDER — THYROID 120 MG PO TABS: 120.0000 mg | ORAL_TABLET | Freq: Every day | ORAL | 2 refills | Status: AC

## 2023-11-07 ENCOUNTER — Ambulatory Visit (INDEPENDENT_AMBULATORY_CARE_PROVIDER_SITE_OTHER): Admitting: Physician Assistant

## 2023-11-07 ENCOUNTER — Ambulatory Visit

## 2023-11-07 VITALS — BP 124/89 | HR 84 | Ht 65.0 in | Wt 153.0 lb

## 2023-11-07 DIAGNOSIS — R9389 Abnormal findings on diagnostic imaging of other specified body structures: Secondary | ICD-10-CM | POA: Insufficient documentation

## 2023-11-07 DIAGNOSIS — R1310 Dysphagia, unspecified: Secondary | ICD-10-CM | POA: Diagnosis not present

## 2023-11-07 DIAGNOSIS — E039 Hypothyroidism, unspecified: Secondary | ICD-10-CM

## 2023-11-09 ENCOUNTER — Encounter: Payer: Self-pay | Admitting: Physician Assistant

## 2023-11-09 ENCOUNTER — Ambulatory Visit: Payer: Self-pay | Admitting: Physician Assistant

## 2023-11-09 LAB — TSH+FREE T4
Free T4: 0.88 ng/dL (ref 0.82–1.77)
TSH: 1.3 u[IU]/mL (ref 0.450–4.500)

## 2023-11-09 NOTE — Progress Notes (Signed)
   Established Patient Office Visit  Subjective   Patient ID: Jocelyn Taylor, female    DOB: May 17, 1985  Age: 38 y.o. MRN: [id number]  Chief Complaint  Patient presents with   Medical Management of Chronic Issues    HPI Pt is a 38 yo female with hypothyroidism and on levothyroxine  daily who presents to the clinic wanting her thyroid  checked. She has a friend who is her age and dx with thyroid  cancer. She is not sure if in her head or not but she feels like her neck might be bigger and she has problems swallowing from time to time. No choking. She also feels like she can lose her voice easily. She is compliant with medications. Otherwise she feels good and no hypo or hyper thyroid  symptoms. She has had some thyroid  u/s in past but not recently.   ROS See HPI>    Objective:     BP 124/89   Pulse 84   Ht 5' 5 (1.651 m)   Wt 153 lb (69.4 kg)   SpO2 99%   BMI 25.46 kg/m  BP Readings from Last 3 Encounters:  11/07/23 124/89  09/10/23 108/68  09/05/23 109/67   Wt Readings from Last 3 Encounters:  11/07/23 153 lb (69.4 kg)  09/05/23 155 lb (70.3 kg)  06/06/23 179 lb (81.2 kg)      Physical Exam Constitutional:      Appearance: Normal appearance.  Neck:     Comments: Generalized thyroid  enlargement without any palpated nodules.  Cardiovascular:     Rate and Rhythm: Normal rate and regular rhythm.  Pulmonary:     Effort: Pulmonary effort is normal.     Breath sounds: Normal breath sounds.  Musculoskeletal:     Cervical back: Normal range of motion and neck supple. No tenderness.  Lymphadenopathy:     Cervical: No cervical adenopathy.  Neurological:     General: No focal deficit present.     Mental Status: She is alert and oriented to person, place, and time.  Psychiatric:        Mood and Affect: Mood normal.        Assessment & Plan:  Jocelyn Taylor was seen today for medical management of chronic issues.  Diagnoses and all orders for this visit:  Acquired  hypothyroidism -     TSH + free T4 -     US  THYROID ; Future  Dysphagia, unspecified type -     TSH + free T4 -     US  THYROID ; Future  Thyroid  with heterogeneous echotexture determined by ultrasound -     TSH + free T4 -     US  THYROID ; Future  2 months ago thyroid  levels were perfect Recheck today with thyroid  u/s Last thyroid  us  was 2026 and showed no nodules but heterogeneously enlarged thyroid   Will call with results and treat accordingly If continues to have swallowing or voice issues we can consider more work up with swallow test and possible ENT referral   Vermell Bologna, PA-C

## 2023-11-09 NOTE — Progress Notes (Signed)
 Evy,   Thyroid  normal range.

## 2023-11-09 NOTE — Progress Notes (Signed)
 Pallie,   No worrisome nodules. Thyroid  remains enlarged throughout due to thyroid  disease. No concern lymph nodes.

## 2023-11-15 DIAGNOSIS — I872 Venous insufficiency (chronic) (peripheral): Secondary | ICD-10-CM | POA: Diagnosis not present

## 2023-11-15 DIAGNOSIS — R252 Cramp and spasm: Secondary | ICD-10-CM | POA: Diagnosis not present

## 2023-11-15 DIAGNOSIS — M7989 Other specified soft tissue disorders: Secondary | ICD-10-CM | POA: Diagnosis not present

## 2023-12-13 ENCOUNTER — Encounter: Payer: Self-pay | Admitting: Physician Assistant

## 2023-12-13 MED ORDER — INSULIN SYRINGE-NEEDLE U-100 32G X 5/16" 0.5 ML MISC: 0 refills | Status: AC

## 2023-12-13 NOTE — Telephone Encounter (Signed)
 Pended syringes for refill  However Tirzepatide  not showing on current med list  Syringes last written 09/10/2023 qty #6 Last OV 11/07/2023 Upcoming appt = none

## 2023-12-28 ENCOUNTER — Telehealth: Payer: Self-pay

## 2023-12-28 NOTE — Telephone Encounter (Signed)
 Copied from CRM [id number]. Topic: General - Call Back - No Documentation >> Dec 28, 2023  1:32 PM Thliyah D wrote: Pt picked up her needles 2 weeks ago and wanted to tell her pcp thank you!

## 2023-12-28 NOTE — Telephone Encounter (Signed)
 Attempted to contact patient in regards to 32G needles sent into preferred pharmacy. Left a voice message for a call back. We would just like confirmation she is aware of prescription sent to peferred pharmacy.

## 2024-02-05 DIAGNOSIS — H6691 Otitis media, unspecified, right ear: Secondary | ICD-10-CM | POA: Diagnosis not present

## 2024-02-05 DIAGNOSIS — J101 Influenza due to other identified influenza virus with other respiratory manifestations: Secondary | ICD-10-CM | POA: Diagnosis not present

## 2024-02-05 DIAGNOSIS — R059 Cough, unspecified: Secondary | ICD-10-CM | POA: Diagnosis not present

## 2024-03-19 ENCOUNTER — Other Ambulatory Visit: Payer: Self-pay

## 2024-03-19 ENCOUNTER — Other Ambulatory Visit: Payer: Self-pay | Admitting: Physician Assistant

## 2024-03-19 MED ORDER — CYANOCOBALAMIN 1000 MCG/ML IJ SOLN: 1000.0000 ug | INTRAMUSCULAR | 1 refills | Status: AC

## 2024-03-19 NOTE — Telephone Encounter (Signed)
 Copied from CRM [id number]. Topic: Clinical - Prescription Issue >> Mar 19, 2024  1:38 PM Wyona SQUIBB wrote: Todd called in to request a recheck of prescription  cyanocobalamin  (VITAMIN B12) 1000 MCG/ML injection   Directions dont match quantity or refills.   Please follow up   Todd GLENWOOD Arloa Werner [phone]

## 2024-03-19 NOTE — Telephone Encounter (Signed)
 Pended prescription. The directions read....   INJECT 1 ML INTO THE MUSCLE ONCE FOR ONE DOSE

## 2024-03-19 NOTE — Telephone Encounter (Signed)
 Request received for Vit B12 inj refill - not showing in current med list  Last written 09/10/2023 Last OV 09/17/225 Upcoming appt = none

## 2024-03-20 ENCOUNTER — Encounter: Payer: Self-pay | Admitting: Physician Assistant

## 2024-03-20 DIAGNOSIS — L299 Pruritus, unspecified: Secondary | ICD-10-CM

## 2024-03-20 MED ORDER — "BD BLUNT FILL NEEDLE 18G X 1-1/2"" MISC": 1.0000 | 0 refills | Status: AC

## 2024-03-20 MED ORDER — "SYRINGE 25G X 1"" 3 ML MISC": 1.0000 | 0 refills | Status: AC

## 2024-03-20 NOTE — Telephone Encounter (Signed)
 FYI  Syringes and needles sent to [facility] pharmacy for use with B12 injections at home.

## 2024-03-20 NOTE — Addendum Note (Signed)
 Addended by: Sherrina Zaugg P on: 03/20/2024 10:54 AM   Modules accepted: Orders
